# Patient Record
Sex: Male | Born: 1950
Health system: Southern US, Community
[De-identification: ages and names within clinical notes are randomized; demographics above are authoritative.]

## PROBLEM LIST (undated history)

## (undated) DIAGNOSIS — Z87442 Personal history of urinary calculi: Secondary | ICD-10-CM

## (undated) DIAGNOSIS — F329 Major depressive disorder, single episode, unspecified: Secondary | ICD-10-CM

## (undated) DIAGNOSIS — K219 Gastro-esophageal reflux disease without esophagitis: Secondary | ICD-10-CM

## (undated) DIAGNOSIS — J449 Chronic obstructive pulmonary disease, unspecified: Secondary | ICD-10-CM

## (undated) DIAGNOSIS — F419 Anxiety disorder, unspecified: Secondary | ICD-10-CM

## (undated) DIAGNOSIS — I10 Essential (primary) hypertension: Secondary | ICD-10-CM

## (undated) DIAGNOSIS — M1990 Unspecified osteoarthritis, unspecified site: Secondary | ICD-10-CM

## (undated) DIAGNOSIS — F32A Depression, unspecified: Secondary | ICD-10-CM

## (undated) DIAGNOSIS — R0600 Dyspnea, unspecified: Secondary | ICD-10-CM

## (undated) DIAGNOSIS — N200 Calculus of kidney: Secondary | ICD-10-CM

## (undated) DIAGNOSIS — M898X9 Other specified disorders of bone, unspecified site: Secondary | ICD-10-CM

## (undated) DIAGNOSIS — M4800 Spinal stenosis, site unspecified: Secondary | ICD-10-CM

## (undated) DIAGNOSIS — K5792 Diverticulitis of intestine, part unspecified, without perforation or abscess without bleeding: Secondary | ICD-10-CM

## (undated) DIAGNOSIS — I714 Abdominal aortic aneurysm, without rupture, unspecified: Secondary | ICD-10-CM

## (undated) HISTORY — PX: BACK SURGERY: SHX140

## (undated) HISTORY — PX: OTHER SURGICAL HISTORY: SHX169

## (undated) HISTORY — PX: CERVICAL SPINE SURGERY: SHX589

## (undated) HISTORY — PX: CYSTOSCOPY: SUR368

---

## 1999-06-16 ENCOUNTER — Inpatient Hospital Stay (HOSPITAL_COMMUNITY): Admission: EM | Admit: 1999-06-16 | Discharge: 1999-06-17 | Payer: Self-pay | Admitting: Cardiology

## 2000-10-05 ENCOUNTER — Emergency Department (HOSPITAL_COMMUNITY): Admission: EM | Admit: 2000-10-05 | Discharge: 2000-10-05 | Payer: Self-pay | Admitting: *Deleted

## 2003-04-29 ENCOUNTER — Ambulatory Visit (HOSPITAL_COMMUNITY): Admission: RE | Admit: 2003-04-29 | Discharge: 2003-04-29 | Payer: Self-pay | Admitting: Gastroenterology

## 2003-04-29 ENCOUNTER — Encounter (INDEPENDENT_AMBULATORY_CARE_PROVIDER_SITE_OTHER): Payer: Self-pay | Admitting: Specialist

## 2005-04-05 ENCOUNTER — Ambulatory Visit (HOSPITAL_COMMUNITY): Admission: RE | Admit: 2005-04-05 | Discharge: 2005-04-05 | Payer: Self-pay | Admitting: Internal Medicine

## 2006-05-17 ENCOUNTER — Ambulatory Visit (HOSPITAL_COMMUNITY): Admission: RE | Admit: 2006-05-17 | Discharge: 2006-05-17 | Payer: Self-pay | Admitting: Internal Medicine

## 2006-05-19 ENCOUNTER — Ambulatory Visit (HOSPITAL_COMMUNITY): Admission: RE | Admit: 2006-05-19 | Discharge: 2006-05-19 | Payer: Self-pay | Admitting: Urology

## 2006-05-22 ENCOUNTER — Ambulatory Visit (HOSPITAL_COMMUNITY): Admission: RE | Admit: 2006-05-22 | Discharge: 2006-05-22 | Payer: Self-pay | Admitting: Urology

## 2006-05-22 ENCOUNTER — Encounter (INDEPENDENT_AMBULATORY_CARE_PROVIDER_SITE_OTHER): Payer: Self-pay | Admitting: *Deleted

## 2006-08-08 ENCOUNTER — Ambulatory Visit (HOSPITAL_COMMUNITY): Admission: RE | Admit: 2006-08-08 | Discharge: 2006-08-08 | Payer: Self-pay | Admitting: Urology

## 2006-08-14 ENCOUNTER — Ambulatory Visit (HOSPITAL_COMMUNITY): Admission: RE | Admit: 2006-08-14 | Discharge: 2006-08-14 | Payer: Self-pay | Admitting: Urology

## 2008-08-26 ENCOUNTER — Ambulatory Visit (HOSPITAL_COMMUNITY): Admission: RE | Admit: 2008-08-26 | Discharge: 2008-08-26 | Payer: Self-pay | Admitting: Gastroenterology

## 2008-08-26 ENCOUNTER — Encounter (INDEPENDENT_AMBULATORY_CARE_PROVIDER_SITE_OTHER): Payer: Self-pay | Admitting: Gastroenterology

## 2009-05-08 ENCOUNTER — Ambulatory Visit (HOSPITAL_COMMUNITY): Admission: RE | Admit: 2009-05-08 | Discharge: 2009-05-08 | Payer: Self-pay | Admitting: Internal Medicine

## 2009-05-12 ENCOUNTER — Ambulatory Visit (HOSPITAL_COMMUNITY): Admission: RE | Admit: 2009-05-12 | Discharge: 2009-05-12 | Payer: Self-pay | Admitting: Internal Medicine

## 2009-09-22 ENCOUNTER — Ambulatory Visit (HOSPITAL_COMMUNITY): Admission: RE | Admit: 2009-09-22 | Discharge: 2009-09-23 | Payer: Self-pay | Admitting: Neurosurgery

## 2010-05-08 LAB — CBC
HCT: 46 % (ref 39.0–52.0)
Hemoglobin: 15.7 g/dL (ref 13.0–17.0)
Platelets: 233 10*3/uL (ref 150–400)
RBC: 4.74 MIL/uL (ref 4.22–5.81)
RDW: 14.5 % (ref 11.5–15.5)

## 2010-05-08 LAB — BASIC METABOLIC PANEL
BUN: 13 mg/dL (ref 6–23)
CO2: 31 mEq/L (ref 19–32)
Chloride: 106 mEq/L (ref 96–112)
GFR calc non Af Amer: >60 mL/min (ref >60)
Potassium: 4.6 mEq/L (ref 3.5–5.1)

## 2010-05-08 LAB — SURGICAL PCR SCREEN: MRSA, PCR: NEGATIVE

## 2010-05-08 LAB — URINALYSIS, ROUTINE W REFLEX MICROSCOPIC
Bilirubin Urine: NEGATIVE
Ketones, ur: NEGATIVE mg/dL
Leukocytes, UA: NEGATIVE
pH: 6 (ref 5.0–8.0)

## 2010-05-08 LAB — PROTIME-INR: Prothrombin Time: 11.9 seconds (ref 11.6–15.2)

## 2010-05-08 LAB — URINE MICROSCOPIC-ADD ON

## 2010-05-08 LAB — APTT: aPTT: 27 seconds (ref 24–37)

## 2010-06-08 ENCOUNTER — Other Ambulatory Visit (HOSPITAL_COMMUNITY): Payer: Self-pay | Admitting: Internal Medicine

## 2010-06-10 ENCOUNTER — Other Ambulatory Visit (HOSPITAL_COMMUNITY): Payer: Self-pay | Admitting: Internal Medicine

## 2010-06-10 ENCOUNTER — Ambulatory Visit (HOSPITAL_COMMUNITY)
Admission: RE | Admit: 2010-06-10 | Discharge: 2010-06-10 | Disposition: A | Discharge status: Home / Self Care | Payer: 59 | Source: Ambulatory Visit | Attending: Internal Medicine | Admitting: Internal Medicine

## 2010-06-10 DIAGNOSIS — N200 Calculus of kidney: Secondary | ICD-10-CM | POA: Insufficient documentation

## 2010-06-10 DIAGNOSIS — N400 Enlarged prostate without lower urinary tract symptoms: Secondary | ICD-10-CM | POA: Insufficient documentation

## 2010-06-10 DIAGNOSIS — R1032 Left lower quadrant pain: Secondary | ICD-10-CM | POA: Insufficient documentation

## 2010-06-10 DIAGNOSIS — R1031 Right lower quadrant pain: Secondary | ICD-10-CM | POA: Insufficient documentation

## 2010-06-10 DIAGNOSIS — Q619 Cystic kidney disease, unspecified: Secondary | ICD-10-CM | POA: Insufficient documentation

## 2010-06-10 DIAGNOSIS — K5730 Diverticulosis of large intestine without perforation or abscess without bleeding: Secondary | ICD-10-CM | POA: Insufficient documentation

## 2010-06-16 ENCOUNTER — Ambulatory Visit (INDEPENDENT_AMBULATORY_CARE_PROVIDER_SITE_OTHER): Payer: 59 | Admitting: Urology

## 2010-06-16 DIAGNOSIS — N200 Calculus of kidney: Secondary | ICD-10-CM

## 2010-06-16 DIAGNOSIS — R319 Hematuria, unspecified: Secondary | ICD-10-CM

## 2010-06-16 DIAGNOSIS — N529 Male erectile dysfunction, unspecified: Secondary | ICD-10-CM

## 2010-07-06 NOTE — Op Note (Signed)
NAME:  Jeremy Mclean, Jeremy Mclean                ACCOUNT NO.:  [id number]   MEDICAL RECORD NO.:  [id number]          PATIENT TYPE:  AMB   LOCATION:  ENDO                         FACILITY:  [facility]   PHYSICIAN:  John C. Madilyn Fireman, M.D.    DATE OF BIRTH:  [birth year]   DATE OF PROCEDURE:  DATE OF DISCHARGE:                               OPERATIVE REPORT   TITLE OF PROCEDURE:  Colonoscopy with polypectomy.   INDICATIONS FOR PROCEDURE:  Family history of colon polyps or colon  cancer in a first-degree relative.   PROCEDURE:  The patient was placed in the left lateral decubitus  position and placed on the pulse monitor with continuous low-flow oxygen  delivered by nasal cannula.  He  was sedated with 100 mcg of IV fentanyl  and 10 mg of IV Versed.  The Olympus video colonoscope was inserted into  the rectum and advanced to the cecum, confirmed by transillumination of  McBurney's point and visualization of ileocecal valve and appendiceal  orifice.  The prep was good.  The cecum, ascending and transverse colon  all appeared normal with no masses, polyps, diverticula or other mucosal  abnormalities.  Within the descending and sigmoid colon there was seen  multiple diverticula.  At the rectosigmoid junction there was a small  sessile polyp that was fulgurated by hot biopsy.  The remainder of the  rectosigmoid and rectum appeared normal.  The scope was then withdrawn  and the patient returned to the recovery room in stable condition.   He tolerated the procedure well.  There were no immediate complications.   IMPRESSION:  1. Rectosigmoid polyp.  2. Left-sided diverticulosis.   PLAN:  Await histology and will probably repeat colonoscopy in 5 years.           ______________________________  Everardo All Madilyn Fireman, M.D.     JCH/MEDQ  D:  08/26/2008  T:  08/26/2008  Job:  [id number]   cc:   Kingsley Callander. Ouida Sills, MD  Fax: [phone]

## 2010-07-09 NOTE — Op Note (Signed)
NAME:  Jeremy Mclean, Jeremy Mclean                          ACCOUNT NO.:  [id number]   MEDICAL RECORD NO.:  [id number]                   PATIENT TYPE:  AMB   LOCATION:  ENDO                                 FACILITY:  [facility]   PHYSICIAN:  John C. Madilyn Fireman, M.D.                 DATE OF BIRTH:  [birth year]   DATE OF PROCEDURE:  04/29/2003  DATE OF DISCHARGE:                                 OPERATIVE REPORT   PROCEDURE:  Colonoscopy.   INDICATIONS FOR PROCEDURE:  Family history of colon cancer in a first degree  relative.   DESCRIPTION OF PROCEDURE:  The patient was placed in the left lateral  decubitus position then placed on the pulse monitor with continuous low flow  oxygen delivered by nasal cannula. He was sedated with 75 mcg IV fentanyl  and 8 mg IV Versed. The Olympus video colonoscope was inserted into the  rectum and advanced to the cecum, confirmed by transillumination at  McBurney's point and visualization of the ileocecal valve and appendiceal  orifice. The prep was excellent. The cecum, ascending, transverse and  descending colon all appeared normal with no masses, polyps, diverticula or  other mucosal abnormalities.  In the sigmoid colon, there were seen a few  scattered diverticula and no other abnormalities.  Also there was a 7 mm  polyp in the sigmoid that was fulgurated by hot biopsy.  The rectum appeared  normal and retroflexed view of the anus revealed only small internal  hemorrhoids. The scope was then withdrawn and the patient returned to the  recovery room in stable condition. The patient tolerated the procedure well  and there were no immediate complications.   IMPRESSION:  1. Diverticulosis.  2. Small sigmoid colon polyp.   PLAN:  Await histology to determine interval for next colonoscopy.                                               John C. Madilyn Fireman, M.D.    JCH/MEDQ  D:  04/29/2003  T:  04/29/2003  Job:  [id number]   cc:   Kingsley Callander. Ouida Sills, M.D.  [address]  [place]  [place] [address]  Fax: [phone]

## 2010-07-09 NOTE — H&P (Signed)
Jeremy, Mclean                ACCOUNT NO.:  [id number]   MEDICAL RECORD NO.:  [id number]          PATIENT TYPE:  AMB   LOCATION:  DAY                           FACILITY:  [facility]   PHYSICIAN:  Dennie Maizes, M.D.   DATE OF BIRTH:  November 25, 1950   DATE OF ADMISSION:  05/22/2006  DATE OF DISCHARGE:  LH                              HISTORY & PHYSICAL   CHIEF COMPLAINT:  Left flank pain radiating to the front, left ureteral  calculus.   HISTORY OF PRESENT ILLNESS:  This 60 year old male has history of  recurrent urolithiasis.  He was referred to me by Dr. Ouida Sills.  He had  been having left flank pain and left  abdominal pain of mild intensity  for the past several months.  The pain became serious about a week ago.  He has severe left flank pain radiating to the front.  He was seen by  Dr. Ouida Sills in his office and sent for CT scan of the abdomen and pelvis  without contrast.  This has revealed small bilateral renal calculi  without obstruction.  There is a 4 mm size left distal ureteral calculus  with minimal hydronephrosis and hydroureter.  The patient is unable to  pass the stone.   He complains of urinary frequency and nocturia x0.  He has no history of  fever or chills, voiding difficulty, worsening hematuria, or nausea or  vomiting.  The patient last passed a stone about a year ago.   PAST MEDICAL HISTORY:  No medical illnesses.  History of recurrent  urolithiasis.   MEDICATIONS:  Valium 5 mg p.o. 3 times a day p.r.n.   ALLERGIES:  None.   OPERATIONS:  None.   FAMILY HISTORY:  Positive for carcinoma of the spine.   PHYSICAL EXAMINATION:  VITAL SIGNS:  Height 5 feet 10 inches, weight 212  pounds.  HEAD, EYES, EARS, NOSE, AND THROAT:  Normal.  NECK:  No masses.  LUNGS:  Clear to auscultation.  HEART:  Regular rate and rhythm with no murmurs.  ABDOMEN:  Soft.  No palpable flank mass or CVA tenderness.  Bladder not  palpable.  GU:  Penis and testes are normal.  RECTAL:  Examination not done.   X-ray of KUB area revealed a 5 mm size left distal ureteral calculus at  the ureterosacral junction area.   IMPRESSION:  1. Left distal ureter calculus with obstruction.  2. Left renal colic.  3. Left hydronephrosis.   PLAN:  The patient is unable to pass the stone, and he has intermittent  severe pain.  He is cleared to undergo cystoscopy, left retrograde  pyelogram, left uroscopic stone extraction, and left ureteral stent  placement in short-stay center on May 22, 2006.  I have explained to  the patient regarding the diagnosis, operative details, alternative  treatments, outcome, possible risks and complications, and he has agreed  for the procedure to be done.      Dennie Maizes, M.D.  Electronically Signed     SK/MEDQ  D:  05/21/2006  T:  05/21/2006  Job:  [id number]   cc:  Kingsley Callander. Ouida Sills, MD  Fax: [phone]   [facility]  Fax: [phone]

## 2010-07-09 NOTE — Op Note (Signed)
NAMESIGURD, PUGH                ACCOUNT NO.:  [id number]   MEDICAL RECORD NO.:  [id number]          PATIENT TYPE:  AMB   LOCATION:  DAY                           FACILITY:  [facility]   PHYSICIAN:  Dennie Maizes, M.D.   DATE OF BIRTH:  [birth year]   DATE OF PROCEDURE:  05/22/2006  DATE OF DISCHARGE:                               OPERATIVE REPORT   PREOPERATIVE DIAGNOSIS:  Left distal ureteral calculus with obstruction,  left hydronephrosis, left renal colic.   POSTOPERATIVE DIAGNOSIS:  Left distal ureteral calculus with  obstruction, left hydronephrosis, left renal colic.   OPERATIVE PROCEDURE:  Cystoscopy, left retrograde pyelogram, left  ureteroscopy, stone extraction and left ureteral stent placement.   ANESTHESIA:  General.   SURGEON:  Dennie Maizes, M.D.   COMPLICATIONS:  None.   ESTIMATED BLOOD LOSS:  Minimal.   SPECIMEN:  Left ureteral calculus which was sent for chemical analysis.   DRAINS:  6 French 26 cm size left ureteral stent with a string.   COMPLICATIONS:  None.   INDICATIONS FOR PROCEDURE:  This 60 year old male had severe left flank  pain.  Evaluation revealed a 4 mm size left distal ureteral calculus  with obstruction and hydronephrosis.  The patient was unable to pass the  stone.  He was taken to the operating room today for cystoscopy, left  retrograde pyelogram, left ureteroscopy, stone extraction and left  ureteral stent placement.   DESCRIPTION OF PROCEDURE:  General anesthesia was induced and the  patient was placed on the OR table in the dorsal lithotomy position.  The lower abdomen and genitalia were prepped and draped in a sterile  fashion.  Cystoscopy was done with a 25-French scope.  Urethra and  prostate were normal.  The appearance of bladder was unremarkable.  A 5-  French wedge catheter was then placed in the left ureteral orifice.  About 7 mL of Omnipaque was injected in the collecting system and  retrograde pyelogram was done using  C-arm fluoroscopy.  There is a small  filling defect in the distal ureter about 1 cm above ureteral orifice.   A 5-French open-ended catheter was then placed in the left ureteral  orifice.  A 0.138 the Bentson guide wire with a flexible tip was then  advanced in the left renal pelvis.  Distal ureter was dilated using a 18-  French 6 cm size balloon dilating catheter.  The balloon dilating  catheter was then removed leaving the guidewire in place.   Ureteroscopy was done with using a 8.5 French rigid ureteroscope.  The  stone was seen at about 2 cm above ureteral orifice.  The stone was  retrieved with a zero tip nitinol wire basket without any difficulty.  Examination of ureter was done up to the pelvic brim and no other  abnormality was noted.  A 6 French 26 cm size stent with a string was  then inserted to the left collecting system and taped to the penis.  The  cystoscope was removed.  The patient was transferred to the PACU in a  satisfactory condition.  Dennie Maizes, M.D.  Electronically Signed     SK/MEDQ  D:  05/22/2006  T:  05/22/2006  Job:  [id number]   cc:   Kingsley Callander. Ouida Sills, MD  Fax: [phone]

## 2010-07-09 NOTE — H&P (Signed)
Jeremy Mclean. [facility]  Patient:    Jeremy Mclean, Jeremy Mclean                       MRN: [id number] Adm. Date:  [id number] Disc. Date: [id number] Attending:  Lyn Records. Iii                         History and Physical  REASON FOR ADMISSION:  Ventricular bigeminy.  SUBJECTIVE:  The patients story is somewhat confusing.  He was well until yesterday, when he had a tooth extracted. During the night, he took pain medications and began to feel sick. There was some nausea.  When he continued to feel poorly this morning, his daughter measured his blood pressure and it was mildly elevated.  This in conjunction with not feeling well, and noticing some irregularity in his pulse, they brought him to the emergency room.  In the emergency room, he was evaluated by the physician at [facility] ER and found to be in ventricular bigeminy, but with no cardiopulmonary symptoms. Spoke with Dr. Andee Lineman, of the [facility], and arrangements were made to have the patient admitted to [facility] for possible unstable angina, although everyone felt that his symptomatology was very atypical. The patients sister ask that I manage the patient, and I have assumed his care.  The thing with the patient is that he feels that his sickness early this morning was related to Vicodin that he took for pain.  When he was told that his heart rhythm was irregular, he had no sensation of palpitations, or felt any particular problems.  There is no history of heart disease.  He specifically denies tachycardia, palpitations, syncope, dyspnea, coronary disease, exertional angina.  SOCIAL HISTORY:  He is a Visual merchandiser.  He is married.  HABITS:  He smokes cigarettes, two packs per day.  He does not drink alcohol.  PAST MEDICAL HISTORY:  Negative for significant problems.  FAMILY HISTORY:  Mother had a heart attack at age 83.  She also has had CVAs and has dementia.  ALLERGIES:  ______________ contrast  agents.  MEDICATIONS:  Veetids 500 mg q.6h. and Vicodin were given yesterday for pain.  REVIEW OF SYSTEMS:  No limitations on physical activity.  Sinus trouble particular this time of year.  No history of heart disease, lung disease, kidney disease, liver disease, skin disease.  PHYSICAL EXAMINATION:  VITAL SIGNS:  Blood pressure 140/70, heart rate 68, respirations 16 and nonlabored.  HEENT:  Grossly unremarkable.  NECK:  Exam reveals no JVD, carotid bruits, or thyromegaly.  CHEST:  Clear to auscultation and percussion.  CARDIAC:  Normal.  No murmurs, rubs, clicks, or gallops are heard.  ABDOMEN:  Soft.  Liver and spleen not palpable.  Bowel sounds normal.  EXTREMITIES:  Reveal no edema.  Pulses are 2+ and symmetric.  NEUROLOGIC:  Normal.  LABORATORY DATA:  EKG does not reveal any acute changes.  No evidence of prior infarction, PVCs and a bigeminal pattern.  Chest x-ray, portable, done at [facility], reveals no acute process.  Blood work done at [facility] revealed a hemoglobin of 16.4, WBC of 10.4 thousand, platelet count 234.  Potassium 3.8.  CK-MB and troponin I were normal.  ASSESSMENT: 1. Ventricular bigeminy, probably related to catecholamine effect from sinus    medication and stress related to the pain from tooth extraction.  We need    to rule out left ventricular  dysfunction.  Perhaps, the patient should    eventually have a stress test performed to rule out occult coronary    disease. 2. Recent tooth abstraction. 3. Borderline blood pressure.  PLAN: 1. Admit to monitor. 2. Serial enzymes to rule out infarction. 3. A 2-D echocardiogram to access the heart for structural integrity. 4. May eventually require excess treadmill test as a baseline for    future reference, given risk factors. DD:  06/16/99 TD:  06/16/99 Job: [id number]

## 2010-07-20 ENCOUNTER — Ambulatory Visit (INDEPENDENT_AMBULATORY_CARE_PROVIDER_SITE_OTHER): Payer: 59 | Admitting: Urology

## 2010-07-20 DIAGNOSIS — N200 Calculus of kidney: Secondary | ICD-10-CM

## 2010-07-20 DIAGNOSIS — R319 Hematuria, unspecified: Secondary | ICD-10-CM

## 2010-07-20 DIAGNOSIS — N529 Male erectile dysfunction, unspecified: Secondary | ICD-10-CM

## 2011-11-24 ENCOUNTER — Ambulatory Visit (INDEPENDENT_AMBULATORY_CARE_PROVIDER_SITE_OTHER): Payer: 59 | Admitting: Otolaryngology

## 2011-11-24 DIAGNOSIS — H903 Sensorineural hearing loss, bilateral: Secondary | ICD-10-CM

## 2011-11-24 DIAGNOSIS — H6990 Unspecified Eustachian tube disorder, unspecified ear: Secondary | ICD-10-CM

## 2011-11-24 DIAGNOSIS — H9319 Tinnitus, unspecified ear: Secondary | ICD-10-CM

## 2011-11-24 DIAGNOSIS — H902 Conductive hearing loss, unspecified: Secondary | ICD-10-CM

## 2011-11-24 DIAGNOSIS — H6980 Other specified disorders of Eustachian tube, unspecified ear: Secondary | ICD-10-CM

## 2011-12-18 ENCOUNTER — Encounter (HOSPITAL_COMMUNITY): Payer: Self-pay

## 2011-12-18 ENCOUNTER — Emergency Department (HOSPITAL_COMMUNITY)
Admission: EM | Admit: 2011-12-18 | Discharge: 2011-12-18 | Disposition: A | Discharge status: Home / Self Care | Payer: 59 | Attending: Emergency Medicine | Admitting: Emergency Medicine

## 2011-12-18 DIAGNOSIS — Y9289 Other specified places as the place of occurrence of the external cause: Secondary | ICD-10-CM | POA: Insufficient documentation

## 2011-12-18 DIAGNOSIS — I10 Essential (primary) hypertension: Secondary | ICD-10-CM | POA: Insufficient documentation

## 2011-12-18 DIAGNOSIS — S61209A Unspecified open wound of unspecified finger without damage to nail, initial encounter: Secondary | ICD-10-CM | POA: Insufficient documentation

## 2011-12-18 DIAGNOSIS — S61259A Open bite of unspecified finger without damage to nail, initial encounter: Secondary | ICD-10-CM

## 2011-12-18 DIAGNOSIS — IMO0001 Reserved for inherently not codable concepts without codable children: Secondary | ICD-10-CM | POA: Insufficient documentation

## 2011-12-18 DIAGNOSIS — F17200 Nicotine dependence, unspecified, uncomplicated: Secondary | ICD-10-CM | POA: Insufficient documentation

## 2011-12-18 DIAGNOSIS — Y9389 Activity, other specified: Secondary | ICD-10-CM | POA: Insufficient documentation

## 2011-12-18 HISTORY — DX: Essential (primary) hypertension: I10

## 2011-12-18 MED ORDER — AMOXICILLIN-POT CLAVULANATE 875-125 MG PO TABS: 1.0000 | ORAL_TABLET | Freq: Two times a day (BID) | ORAL | Status: DC

## 2011-12-18 MED ORDER — AMOXICILLIN-POT CLAVULANATE 875-125 MG PO TABS
1.0000 | ORAL_TABLET | Freq: Once | ORAL | Status: AC
  Administered 2011-12-18: 1 via ORAL
  Filled 2011-12-18: qty 1

## 2011-12-18 NOTE — ED Provider Notes (Addendum)
History     CSN: [id number]  Arrival date & time 12/18/11  0865   First MD Initiated Contact with Patient 12/18/11 [phone]      Chief Complaint  Patient presents with  . Animal Bite    (Consider location/radiation/quality/duration/timing/severity/associated sxs/prior treatment) HPI Comments: Pt caught a fox in a trap set for coyotes.  "i was going to shoot him but i had an empty chamber in my gun.  It jumped at me and nipped my finger".  The animal was killed and turned over to animal control and is to be sent to [place] for rabies testing.  DT utd.  No other complaints.  Patient is a 61 y.o. male presenting with animal bite. The history is provided by the patient. No language interpreter was used.  Animal Bite  The incident occurred yesterday. He came to the ER via personal transport. There is an injury to the right long finger. The patient is experiencing no pain. It is unlikely that a foreign body is present. Pertinent negatives include no numbness and no weakness. There have been no prior injuries to these areas. He is right-handed. His tetanus status is UTD. There were no sick contacts. He has received no recent medical care.    Past Medical History  Diagnosis Date  . Hypertension     History reviewed. No pertinent past surgical history.  No family history on file.  History  Substance Use Topics  . Smoking status: Current Every Day Smoker  . Smokeless tobacco: Not on file  . Alcohol Use: No      Review of Systems  Constitutional: Negative for fever and chills.  Skin: Positive for wound.  Neurological: Negative for weakness and numbness.  All other systems reviewed and are negative.    Allergies  Iohexol  Home Medications   Current Outpatient Rx  Name Route Sig Dispense Refill  . AMOXICILLIN-POT CLAVULANATE 875-125 MG PO TABS Oral Take 1 tablet by mouth every 12 (twelve) hours. 14 tablet 0    BP 143/111  Pulse 77  Temp 98 F (36.7 C)  Resp 16  Ht 5\' 8"   (1.727 m)  Wt 190 lb (86.183 kg)  BMI 28.89 kg/m2  SpO2 97%  Physical Exam  Nursing note and vitals reviewed. Constitutional: He is oriented to person, place, and time. He appears well-developed and well-nourished.  HENT:  Head: Normocephalic and atraumatic.  Right Ear: External ear normal.  Left Ear: External ear normal.  Eyes: EOM are normal.  Neck: Normal range of motion.  Cardiovascular: Normal rate, regular rhythm and intact distal pulses.   Pulmonary/Chest: Effort normal. No respiratory distress.  Abdominal: Soft. He exhibits no distension. There is no tenderness.  Musculoskeletal: Normal range of motion. He exhibits no tenderness.       Right hand: He exhibits laceration. He exhibits normal range of motion, no tenderness, no bony tenderness, normal two-point discrimination, normal capillary refill, no deformity and no swelling. normal sensation noted. Normal strength noted.       Hands: Neurological: He is alert and oriented to person, place, and time.  Skin: Skin is warm and dry.  Psychiatric: He has a normal mood and affect. Judgment normal.    ED Course  Procedures (including critical care time)  Labs Reviewed - No data to display No results found.   1. Animal bite of finger       MDM  Waiting on rabies testing on fox in [place] rx-augmentin 875 BID, 14 DT UTD  Addendum on 12-19-11.  Pt returned after being called by animal control and told that the animal is being sent to [place] today and that they are concerned that the rabies test results may not be back in time window to allow the series to be administered.  He will be given the RIF and initial vaccine.  The remaining shots in the specialty clinic.    Evalina Field, PA 12/18/11 0840  Evalina Field, PA 12/19/11 1050

## 2011-12-18 NOTE — ED Notes (Signed)
C/o fox bite to rt middle finger that occurred yesterday. Fox sent off to be tested for rabies.

## 2011-12-18 NOTE — ED Provider Notes (Signed)
Medical screening examination/treatment/procedure(s) were performed by non-physician practitioner and as supervising physician I was immediately available for consultation/collaboration.  John-Adam Kasra Melvin, M.D.     John-Adam Gregg Holster, MD 12/18/11 1243 

## 2011-12-19 ENCOUNTER — Emergency Department (HOSPITAL_COMMUNITY)
Admission: EM | Admit: 2011-12-19 | Discharge: 2011-12-19 | Disposition: A | Discharge status: Home / Self Care | Payer: 59 | Source: Home / Self Care | Attending: Emergency Medicine | Admitting: Emergency Medicine

## 2011-12-19 DIAGNOSIS — T148XXA Other injury of unspecified body region, initial encounter: Secondary | ICD-10-CM

## 2011-12-19 DIAGNOSIS — Z792 Long term (current) use of antibiotics: Secondary | ICD-10-CM | POA: Insufficient documentation

## 2011-12-19 DIAGNOSIS — T148 Other injury of unspecified body region: Secondary | ICD-10-CM | POA: Insufficient documentation

## 2011-12-19 DIAGNOSIS — I10 Essential (primary) hypertension: Secondary | ICD-10-CM | POA: Insufficient documentation

## 2011-12-19 DIAGNOSIS — W5581XA Bitten by other mammals, initial encounter: Secondary | ICD-10-CM | POA: Insufficient documentation

## 2011-12-19 DIAGNOSIS — Y939 Activity, unspecified: Secondary | ICD-10-CM | POA: Insufficient documentation

## 2011-12-19 DIAGNOSIS — Y929 Unspecified place or not applicable: Secondary | ICD-10-CM | POA: Insufficient documentation

## 2011-12-19 DIAGNOSIS — Z79899 Other long term (current) drug therapy: Secondary | ICD-10-CM | POA: Insufficient documentation

## 2011-12-19 DIAGNOSIS — F17200 Nicotine dependence, unspecified, uncomplicated: Secondary | ICD-10-CM | POA: Insufficient documentation

## 2011-12-19 MED ORDER — RABIES VACCINE, PCEC IM SUSR
1.0000 mL | Freq: Once | INTRAMUSCULAR | Status: AC
  Filled 2011-12-19: qty 1

## 2011-12-19 MED ORDER — RABIES IMMUNE GLOBULIN 150 UNIT/ML IM INJ: 1720.0000 [IU] | INJECTION | Freq: Once | INTRAMUSCULAR | Status: AC

## 2011-12-19 NOTE — ED Provider Notes (Signed)
Medical screening examination/treatment/procedure(s) were conducted as a shared visit with non-physician practitioner(s) and myself.  I personally evaluated the patient during the encounter   Rabies vaccination now required because animal cannot be studied for possible rabies for several days. Patient to receive rabies protocol (vaccine and RIG) until animal confirmed.   Shelda Jakes, MD 12/19/11 2017

## 2011-12-19 NOTE — ED Notes (Signed)
Pt arrived to ed today, was called by health department and told needed to get rabies vaccinations. Pt returned to er for treatments. Pa miller notified of pt's return.

## 2011-12-19 NOTE — ED Notes (Signed)
1720 Units Hyperab given. Lot number [id number], exp 02 Jun 2013. I inj R deltoid, 2 in bilat lat thighs. 1 ml injection of Rabavert/Imovax given in L deltoid. Lot [id number], exp date 10 Sep 14. Pt tolerated well. No adverse side effects noted.

## 2011-12-22 ENCOUNTER — Ambulatory Visit (HOSPITAL_COMMUNITY): Payer: 59

## 2011-12-26 ENCOUNTER — Encounter (HOSPITAL_COMMUNITY): Payer: 59

## 2012-01-02 ENCOUNTER — Ambulatory Visit (HOSPITAL_COMMUNITY): Payer: 59

## 2012-08-06 ENCOUNTER — Other Ambulatory Visit (HOSPITAL_COMMUNITY): Payer: Self-pay | Admitting: Internal Medicine

## 2012-08-06 DIAGNOSIS — R1032 Left lower quadrant pain: Secondary | ICD-10-CM

## 2012-08-08 ENCOUNTER — Ambulatory Visit (HOSPITAL_COMMUNITY)
Admission: RE | Admit: 2012-08-08 | Discharge: 2012-08-08 | Disposition: A | Discharge status: Home / Self Care | Payer: 59 | Source: Ambulatory Visit | Attending: Internal Medicine | Admitting: Internal Medicine

## 2012-08-08 ENCOUNTER — Encounter (HOSPITAL_COMMUNITY): Payer: Self-pay

## 2012-08-08 DIAGNOSIS — R1032 Left lower quadrant pain: Secondary | ICD-10-CM | POA: Insufficient documentation

## 2012-08-08 DIAGNOSIS — K5730 Diverticulosis of large intestine without perforation or abscess without bleeding: Secondary | ICD-10-CM | POA: Insufficient documentation

## 2013-03-27 ENCOUNTER — Other Ambulatory Visit (HOSPITAL_COMMUNITY): Payer: Self-pay | Admitting: Internal Medicine

## 2013-03-27 ENCOUNTER — Ambulatory Visit (HOSPITAL_COMMUNITY)
Admission: RE | Admit: 2013-03-27 | Discharge: 2013-03-27 | Disposition: A | Discharge status: Home / Self Care | Payer: 59 | Source: Ambulatory Visit | Attending: Internal Medicine | Admitting: Internal Medicine

## 2013-03-27 DIAGNOSIS — R509 Fever, unspecified: Secondary | ICD-10-CM | POA: Insufficient documentation

## 2013-03-27 DIAGNOSIS — R05 Cough: Secondary | ICD-10-CM

## 2013-03-27 DIAGNOSIS — R059 Cough, unspecified: Secondary | ICD-10-CM | POA: Insufficient documentation

## 2013-04-09 ENCOUNTER — Encounter (HOSPITAL_COMMUNITY): Payer: Self-pay | Admitting: Emergency Medicine

## 2013-04-09 ENCOUNTER — Emergency Department (HOSPITAL_COMMUNITY)
Admission: EM | Admit: 2013-04-09 | Discharge: 2013-04-09 | Disposition: A | Discharge status: Home / Self Care | Payer: 59 | Attending: Emergency Medicine | Admitting: Emergency Medicine

## 2013-04-09 DIAGNOSIS — R109 Unspecified abdominal pain: Secondary | ICD-10-CM

## 2013-04-09 DIAGNOSIS — K5792 Diverticulitis of intestine, part unspecified, without perforation or abscess without bleeding: Secondary | ICD-10-CM

## 2013-04-09 DIAGNOSIS — I10 Essential (primary) hypertension: Secondary | ICD-10-CM | POA: Insufficient documentation

## 2013-04-09 DIAGNOSIS — Z79899 Other long term (current) drug therapy: Secondary | ICD-10-CM | POA: Insufficient documentation

## 2013-04-09 DIAGNOSIS — Z87442 Personal history of urinary calculi: Secondary | ICD-10-CM | POA: Insufficient documentation

## 2013-04-09 DIAGNOSIS — F17200 Nicotine dependence, unspecified, uncomplicated: Secondary | ICD-10-CM | POA: Insufficient documentation

## 2013-04-09 DIAGNOSIS — Z8659 Personal history of other mental and behavioral disorders: Secondary | ICD-10-CM | POA: Insufficient documentation

## 2013-04-09 DIAGNOSIS — K5732 Diverticulitis of large intestine without perforation or abscess without bleeding: Secondary | ICD-10-CM | POA: Insufficient documentation

## 2013-04-09 HISTORY — DX: Calculus of kidney: N20.0

## 2013-04-09 HISTORY — DX: Depression, unspecified: F32.A

## 2013-04-09 HISTORY — DX: Major depressive disorder, single episode, unspecified: F32.9

## 2013-04-09 HISTORY — DX: Diverticulitis of intestine, part unspecified, without perforation or abscess without bleeding: K57.92

## 2013-04-09 LAB — CBC WITH DIFFERENTIAL/PLATELET
BASOS ABS: 0 10*3/uL (ref 0.0–0.1)
Basophils Relative: 0 % (ref 0–1)
EOS ABS: 0.3 10*3/uL (ref 0.0–0.7)
EOS PCT: 4 % (ref 0–5)
HEMATOCRIT: 44.9 % (ref 39.0–52.0)
Hemoglobin: 15.7 g/dL (ref 13.0–17.0)
LYMPHS PCT: 33 % (ref 12–46)
Lymphs Abs: 2.5 10*3/uL (ref 0.7–4.0)
MCH: 32.5 pg (ref 26.0–34.0)
MCHC: 35 g/dL (ref 30.0–36.0)
MCV: 93 fL (ref 78.0–100.0)
MONO ABS: 1.1 10*3/uL — AB (ref 0.1–1.0)
Monocytes Relative: 14 % — ABNORMAL HIGH (ref 3–12)
Neutro Abs: 3.6 10*3/uL (ref 1.7–7.7)
Neutrophils Relative %: 48 % (ref 43–77)
PLATELETS: 273 10*3/uL (ref 150–400)
RBC: 4.83 MIL/uL (ref 4.22–5.81)
RDW: 14.1 % (ref 11.5–15.5)
WBC: 7.6 10*3/uL (ref 4.0–10.5)

## 2013-04-09 LAB — URINALYSIS, ROUTINE W REFLEX MICROSCOPIC
BILIRUBIN URINE: NEGATIVE
Glucose, UA: NEGATIVE mg/dL
HGB URINE DIPSTICK: NEGATIVE
KETONES UR: NEGATIVE mg/dL
Leukocytes, UA: NEGATIVE
Nitrite: NEGATIVE
Protein, ur: NEGATIVE mg/dL
SPECIFIC GRAVITY, URINE: 1.015 (ref 1.005–1.030)
UROBILINOGEN UA: 0.2 mg/dL (ref 0.0–1.0)
pH: 6 (ref 5.0–8.0)

## 2013-04-09 LAB — COMPREHENSIVE METABOLIC PANEL
ALK PHOS: 60 U/L (ref 39–117)
ALT: 17 U/L (ref 0–53)
AST: 18 U/L (ref 0–37)
Albumin: 3.5 g/dL (ref 3.5–5.2)
BILIRUBIN TOTAL: 0.2 mg/dL — AB (ref 0.3–1.2)
BUN: 10 mg/dL (ref 6–23)
CHLORIDE: 104 meq/L (ref 96–112)
CO2: 26 mEq/L (ref 19–32)
CREATININE: 0.84 mg/dL (ref 0.50–1.35)
Calcium: 9 mg/dL (ref 8.4–10.5)
GFR calc non Af Amer: >90 mL/min (ref >90)
GLUCOSE: 91 mg/dL (ref 70–99)
POTASSIUM: 4 meq/L (ref 3.7–5.3)
Sodium: 141 mEq/L (ref 137–147)
TOTAL PROTEIN: 6.9 g/dL (ref 6.0–8.3)

## 2013-04-09 LAB — LIPASE, BLOOD: LIPASE: 24 U/L (ref 11–59)

## 2013-04-09 MED ORDER — SODIUM CHLORIDE 0.9 % IV BOLUS (SEPSIS)
1000.0000 mL | Freq: Once | INTRAVENOUS | Status: AC
  Administered 2013-04-09: 1000 mL via INTRAVENOUS

## 2013-04-09 MED ORDER — HYDROCODONE-ACETAMINOPHEN 5-325 MG PO TABS: 2.0000 | ORAL_TABLET | ORAL | Status: DC | PRN

## 2013-04-09 MED ORDER — METRONIDAZOLE 500 MG PO TABS
500.0000 mg | ORAL_TABLET | Freq: Three times a day (TID) | ORAL | Status: DC
Start: 2013-04-09 — End: 2013-04-09

## 2013-04-09 MED ORDER — CIPROFLOXACIN HCL 500 MG PO TABS: 500.0000 mg | ORAL_TABLET | Freq: Two times a day (BID) | ORAL | Status: DC

## 2013-04-09 MED ORDER — METRONIDAZOLE 500 MG PO TABS: 500.0000 mg | ORAL_TABLET | Freq: Three times a day (TID) | ORAL | Status: DC

## 2013-04-09 NOTE — Discharge Instructions (Signed)
Cipro and Flagyl as prescribed.  Hydrocodone as needed for pain.  Return to the emergency department if you develop worsening pain, high fever, bloody stool, or if your symptoms are not improving in the next 24-48 hours.   Abdominal Pain, Adult Many things can cause abdominal pain. Usually, abdominal pain is not caused by a disease and will improve without treatment. It can often be observed and treated at home. Your health care provider will do a physical exam and possibly order blood tests and X-rays to help determine the seriousness of your pain. However, in many cases, more time must pass before a clear cause of the pain can be found. Before that point, your health care provider may not know if you need more testing or further treatment. HOME CARE INSTRUCTIONS  Monitor your abdominal pain for any changes. The following actions may help to alleviate any discomfort you are experiencing:  Only take over-the-counter or prescription medicines as directed by your health care provider.  Do not take laxatives unless directed to do so by your health care provider.  Try a clear liquid diet (broth, tea, or water) as directed by your health care provider. Slowly move to a bland diet as tolerated. SEEK MEDICAL CARE IF:  You have unexplained abdominal pain.  You have abdominal pain associated with nausea or diarrhea.  You have pain when you urinate or have a bowel movement.  You experience abdominal pain that wakes you in the night.  You have abdominal pain that is worsened or improved by eating food.  You have abdominal pain that is worsened with eating fatty foods. SEEK IMMEDIATE MEDICAL CARE IF:   Your pain does not go away within 2 hours.  You have a fever.  You keep throwing up (vomiting).  Your pain is felt only in portions of the abdomen, such as the right side or the left lower portion of the abdomen.  You pass bloody or black tarry stools. MAKE SURE YOU:  Understand these  instructions.   Will watch your condition.   Will get help right away if you are not doing well or get worse.  Document Released: 11/17/2004 Document Revised: 11/28/2012 Document Reviewed: 10/17/2012 [facility] Patient Information 2014 [place].  Diverticulitis A diverticulum is a small pouch or sac on the colon. Diverticulosis is the presence of these diverticula on the colon. Diverticulitis is the irritation (inflammation) or infection of diverticula. CAUSES  The colon and its diverticula contain bacteria. If food particles block the tiny opening to a diverticulum, the bacteria inside can grow and cause an increase in pressure. This leads to infection and inflammation and is called diverticulitis. SYMPTOMS   Abdominal pain and tenderness. Usually, the pain is located on the left side of your abdomen. However, it could be located elsewhere.  Fever.  Bloating.  Feeling sick to your stomach (nausea).  Throwing up (vomiting).  Abnormal stools. DIAGNOSIS  Your caregiver will take a history and perform a physical exam. Since many things can cause abdominal pain, other tests may be necessary. Tests may include:  Blood tests.  Urine tests.  X-ray of the abdomen.  CT scan of the abdomen. Sometimes, surgery is needed to determine if diverticulitis or other conditions are causing your symptoms. TREATMENT  Most of the time, you can be treated without surgery. Treatment includes:  Resting the bowels by only having liquids for a few days. As you improve, you will need to eat a low-fiber diet.  Intravenous (IV) fluids if you  are losing body fluids (dehydrated).  Antibiotic medicines that treat infections may be given.  Pain and nausea medicine, if needed.  Surgery if the inflamed diverticulum has burst. HOME CARE INSTRUCTIONS   Try a clear liquid diet (broth, tea, or water for as long as directed by your caregiver). You may then gradually begin a low-fiber diet as  tolerated.  A low-fiber diet is a diet with less than 10 grams of fiber. Choose the foods below to reduce fiber in the diet:  White breads, cereals, rice, and pasta.  Cooked fruits and vegetables or soft fresh fruits and vegetables without the skin.  Ground or well-cooked tender beef, ham, veal, lamb, pork, or poultry.  Eggs and seafood.  After your diverticulitis symptoms have improved, your caregiver may put you on a high-fiber diet. A high-fiber diet includes 14 grams of fiber for every 1000 calories consumed. For a standard 2000 calorie diet, you would need 28 grams of fiber. Follow these diet guidelines to help you increase the fiber in your diet. It is important to slowly increase the amount fiber in your diet to avoid gas, constipation, and bloating.  Choose whole-grain breads, cereals, pasta, and brown rice.  Choose fresh fruits and vegetables with the skin on. Do not overcook vegetables because the more vegetables are cooked, the more fiber is lost.  Choose more nuts, seeds, legumes, dried peas, beans, and lentils.  Look for food products that have greater than 3 grams of fiber per serving on the Nutrition Facts label.  Take all medicine as directed by your caregiver.  If your caregiver has given you a follow-up appointment, it is very important that you go. Not going could result in lasting (chronic) or permanent injury, pain, and disability. If there is any problem keeping the appointment, call to reschedule. SEEK MEDICAL CARE IF:   Your pain does not improve.  You have a hard time advancing your diet beyond clear liquids.  Your bowel movements do not return to normal. SEEK IMMEDIATE MEDICAL CARE IF:   Your pain becomes worse.  You have an oral temperature above 102 F (38.9 C), not controlled by medicine.  You have repeated vomiting.  You have bloody or black, tarry stools.  Symptoms that brought you to your caregiver become worse or are not getting better. MAKE  SURE YOU:   Understand these instructions.  Will watch your condition.  Will get help right away if you are not doing well or get worse. Document Released: 11/17/2004 Document Revised: 05/02/2011 Document Reviewed: 03/15/2010 [facility] Patient Information 2014 [place].

## 2013-04-09 NOTE — ED Provider Notes (Signed)
CSN: [id number]     Arrival date & time 04/09/13  1123 History   First MD Initiated Contact with Patient 04/09/13 1225     Chief Complaint  Patient presents with  . Abdominal Pain     (Consider location/radiation/quality/duration/timing/severity/associated sxs/prior Treatment) HPI Comments: Patient is a 64 year old male with history of hypertension and diverticulitis. He presents today with complaints of lower abdominal pain and nausea for the past 2 weeks. He denies any fevers or chills. He denies any bloody stool. He denies any urinary complaints. He was diagnosed with diverticulitis several months ago and this feels similar.  Patient is a 62 y.o. male presenting with abdominal pain. The history is provided by the patient.  Abdominal Pain Pain location:  Generalized Pain quality: burning   Pain radiates to:  Does not radiate Pain severity:  Moderate Onset quality:  Gradual Duration:  1 week Timing:  Constant Progression:  Worsening Chronicity:  New Relieved by:  Nothing Worsened by:  Nothing tried Ineffective treatments:  None tried   Past Medical History  Diagnosis Date  . Hypertension   . Diverticulitis   . Kidney stone   . Depression    Past Surgical History  Procedure Laterality Date  . Back surgery    . Kidney stent     Family History  Problem Relation Age of Onset  . Cancer Other   . Diabetes Other    History  Substance Use Topics  . Smoking status: Current Every Day Smoker -- 1.00 packs/day for 40 years    Types: Cigarettes  . Smokeless tobacco: Never Used  . Alcohol Use: No    Review of Systems  Gastrointestinal: Positive for abdominal pain.  All other systems reviewed and are negative.      Allergies  Iohexol  Home Medications   Current Outpatient Rx  Name  Route  Sig  Dispense  Refill  . diazepam (VALIUM) 5 MG tablet   Oral   Take 5 mg by mouth daily. Anxiety         . lisinopril (PRINIVIL,ZESTRIL) 40 MG tablet   Oral   Take 40  mg by mouth daily.         Marland Kitchen omeprazole (PRILOSEC) 20 MG capsule   Oral   Take 20 mg by mouth daily.          There were no vitals taken for this visit. Physical Exam  Nursing note and vitals reviewed. Constitutional: He is oriented to person, place, and time. He appears well-developed and well-nourished. No distress.  HENT:  Head: Normocephalic and atraumatic.  Mouth/Throat: Oropharynx is clear and moist.  Neck: Normal range of motion. Neck supple.  Cardiovascular: Normal rate, regular rhythm and normal heart sounds.   No murmur heard. Pulmonary/Chest: Effort normal and breath sounds normal. No respiratory distress. He has no wheezes.  Abdominal: Soft. Bowel sounds are normal. He exhibits no distension and no mass. There is tenderness. There is no rebound and no guarding.  There is tenderness to palpation in the suprapubic region and left lower quadrant. There is no rebound and no guarding.  Musculoskeletal: Normal range of motion. He exhibits no edema.  Lymphadenopathy:    He has no cervical adenopathy.  Neurological: He is alert and oriented to person, place, and time.  Skin: Skin is warm and dry. He is not diaphoretic.    ED Course  Procedures (including critical care time) Labs Review Labs Reviewed  CBC WITH DIFFERENTIAL  URINALYSIS, ROUTINE W REFLEX MICROSCOPIC  COMPREHENSIVE METABOLIC PANEL  LIPASE, BLOOD   Imaging Review No results found.    MDM   Final diagnoses:  None    Patient is a 64 year old male with history of diverticulitis who presents with abdominal pain. He states this feels similar to his prior episodes of diverticulitis. He has not had any fever, bloody stool, or vomiting. His physical examination reveals tenderness suprapubically and in the left side of the abdomen. Workup reveals no significant abnormalities of the blood tests. He will be treated with Cipro and Flagyl for presumed diverticulitis. He is to return if his symptoms worsen or  change.    Veryl Speak, MD 04/09/13 [phone]

## 2013-04-09 NOTE — ED Notes (Signed)
Pt c/o intermittent diarrhea and abd pain for the past few weeks, Dr. Stark Jock in prior to RN< see edp assessment for further,

## 2013-04-09 NOTE — ED Notes (Signed)
Patient c/o intermittent lower abd pain with nausea x2 weeks. Patient denies any fever, vomiting, or diarrhea. Patient reports having diverticulitis 6 months ago and states "This kinda feels like that."

## 2013-04-09 NOTE — ED Notes (Signed)
Lab in room to recollect blood work, pt updated on plan of care,

## 2014-02-05 ENCOUNTER — Encounter (INDEPENDENT_AMBULATORY_CARE_PROVIDER_SITE_OTHER): Payer: Self-pay | Admitting: *Deleted

## 2014-03-18 ENCOUNTER — Ambulatory Visit (INDEPENDENT_AMBULATORY_CARE_PROVIDER_SITE_OTHER): Payer: 59 | Admitting: Internal Medicine

## 2014-03-18 ENCOUNTER — Encounter (INDEPENDENT_AMBULATORY_CARE_PROVIDER_SITE_OTHER): Payer: Self-pay | Admitting: Internal Medicine

## 2014-03-18 ENCOUNTER — Other Ambulatory Visit (INDEPENDENT_AMBULATORY_CARE_PROVIDER_SITE_OTHER): Payer: Self-pay | Admitting: *Deleted

## 2014-03-18 ENCOUNTER — Telehealth (INDEPENDENT_AMBULATORY_CARE_PROVIDER_SITE_OTHER): Payer: Self-pay | Admitting: *Deleted

## 2014-03-18 VITALS — BP 128/76 | HR 72 | Temp 98.2°F | Ht 70.0 in | Wt 198.8 lb

## 2014-03-18 DIAGNOSIS — Z800 Family history of malignant neoplasm of digestive organs: Secondary | ICD-10-CM

## 2014-03-18 DIAGNOSIS — Z86010 Personal history of colonic polyps: Secondary | ICD-10-CM | POA: Diagnosis not present

## 2014-03-18 DIAGNOSIS — I714 Abdominal aortic aneurysm, without rupture, unspecified: Secondary | ICD-10-CM | POA: Insufficient documentation

## 2014-03-18 DIAGNOSIS — Z1211 Encounter for screening for malignant neoplasm of colon: Secondary | ICD-10-CM

## 2014-03-18 DIAGNOSIS — I10 Essential (primary) hypertension: Secondary | ICD-10-CM | POA: Insufficient documentation

## 2014-03-18 MED ORDER — PEG-KCL-NACL-NASULF-NA ASC-C 100 G PO SOLR: 1.0000 | Freq: Once | ORAL | Status: DC

## 2014-03-18 NOTE — Progress Notes (Signed)
Subjective:    Patient ID: Jeremy Mclean, male    DOB: 01/11/1951, 64 y.o.   MRN: [id number]  HPI Referred to our office by Dr. Willey Blade for surveillance colonoscopy. Family hx of colon cancer in a father age 37.  Appetite is good. No weight loss. No abdominal pain.  He usually has a BM once a day.  No melena or BRRB. Occasionally takes Motrin for arthritic pain. States has appt with Pain Management to see if there are medications out there that will control his pain and not be addictive.  Retired from [employer]. Was a Psychologist, sport and exercise. Wife recently diagnosed with breast cancer. Has been under a lot of stress with this.  CBC    Component Value Date/Time   WBC 7.6 04/09/2013 1210   RBC 4.83 04/09/2013 1210   HGB 15.7 04/09/2013 1210   HCT 44.9 04/09/2013 1210   PLT 273 04/09/2013 1210   MCV 93.0 04/09/2013 1210   MCH 32.5 04/09/2013 1210   MCHC 35.0 04/09/2013 1210   RDW 14.1 04/09/2013 1210   LYMPHSABS 2.5 04/09/2013 1210   MONOABS 1.1* 04/09/2013 1210   EOSABS 0.3 04/09/2013 1210   BASOSABS 0.0 04/09/2013 1210   CMP     Component Value Date/Time   NA 141 04/09/2013 1254   K 4.0 04/09/2013 1254   CL 104 04/09/2013 1254   CO2 26 04/09/2013 1254   GLUCOSE 91 04/09/2013 1254   BUN 10 04/09/2013 1254   CREATININE 0.84 04/09/2013 1254   CALCIUM 9.0 04/09/2013 1254   PROT 6.9 04/09/2013 1254   ALBUMIN 3.5 04/09/2013 1254   AST 18 04/09/2013 1254   ALT 17 04/09/2013 1254   ALKPHOS 60 04/09/2013 1254   BILITOT 0.2* 04/09/2013 1254   GFRNONAA >90 04/09/2013 1254   GFRAA >90 04/09/2013 1254         08/27/2008 Colonoscopy: Dr. Teena Irani IMPRESSION: 1. Rectosigmoid polyp. 2. Left-sided diverticulosis. COLON, RECTOSIGMOID POLYP: TUBULAR ADENOMA. NO HIGH GRADE DYSPLASIA OR MALIGNANCY IDENTIFIED.     DATE OF PROCEDURE: 04/29/2003 PROCEDURE: Colonoscopy. Dr. Teena Irani. INDICATIONS FOR PROCEDURE: Family history of colon cancer in a first  degree relative. IMPRESSION: 1. Diverticulosis. 2. Small sigmoid colon polyp. SIGMOID COLON: HYPERPLASTIC POLYP(S). NO ADENOMATOUS CHANGE OR MALIGNANCY IDENTIFIED. Review of Systems Past Medical History  Diagnosis Date  . Hypertension   . Diverticulitis   . Kidney stone   . Depression     Past Surgical History  Procedure Laterality Date  . Back surgery    . Kidney stent      Allergies  Allergen Reactions  . Iohexol Hives, Itching and Other (See Comments)    Burning. Needs pre-meds     Current Outpatient Prescriptions on File Prior to Visit  Medication Sig Dispense Refill  . diazepam (VALIUM) 5 MG tablet Take 5 mg by mouth daily. Anxiety    . lisinopril (PRINIVIL,ZESTRIL) 40 MG tablet Take 40 mg by mouth daily.    Marland Kitchen omeprazole (PRILOSEC) 20 MG capsule Take 20 mg by mouth daily.     Current Facility-Administered Medications on File Prior to Visit  Medication Dose Route Frequency Provider Last Rate Last Dose  . rabies immune globulin (HYPERAB) injection 1,720 Units  1,720 Units Intramuscular Once Jennye Boroughs, PA-C      . rabies vaccine (RABAVERT/IMOVAX) injection 1 mL  1 mL Intramuscular Once Jennye Boroughs, PA-C            Objective:   Physical Exam  Filed Vitals:   03/18/14  1100  Height: 5\' 10"  (1.778 m)  Weight: 198 lb 12.8 oz (90.175 kg)    Alert and oriented. Skin warm and dry. Oral mucosa is moist.   . Sclera anicteric, conjunctivae is pink. Thyroid not enlarged. No cervical lymphadenopathy. Lungs clear. Heart regular rate and rhythm.  Abdomen is soft. Bowel sounds are positive. No hepatomegaly. No abdominal masses felt. No tenderness.  No edema to lower extremities.         Assessment & Plan:  Family hx of colon cancer. Personal hx of colon polyps. Plan: Colonoscopy. The risks and benefits such as perforation, bleeding, and infection were reviewed with the patient and is agreeable.

## 2014-03-18 NOTE — Telephone Encounter (Signed)
Patient needs movi prep 

## 2014-03-18 NOTE — Patient Instructions (Signed)
Colonoscopy.  The risks and benefits such as perforation, bleeding, and infection were reviewed with the patient and is agreeable. 

## 2014-03-31 DIAGNOSIS — J209 Acute bronchitis, unspecified: Secondary | ICD-10-CM | POA: Diagnosis not present

## 2014-04-09 ENCOUNTER — Encounter (HOSPITAL_COMMUNITY)
Admission: RE | Disposition: A | Discharge status: Home / Self Care | Payer: Self-pay | Source: Ambulatory Visit | Attending: Internal Medicine

## 2014-04-09 ENCOUNTER — Encounter (HOSPITAL_COMMUNITY): Payer: Self-pay

## 2014-04-09 ENCOUNTER — Ambulatory Visit (HOSPITAL_COMMUNITY)
Admission: RE | Admit: 2014-04-09 | Discharge: 2014-04-09 | Disposition: A | Discharge status: Home / Self Care | Payer: Medicare Other | Source: Ambulatory Visit | Attending: Internal Medicine | Admitting: Internal Medicine

## 2014-04-09 DIAGNOSIS — F329 Major depressive disorder, single episode, unspecified: Secondary | ICD-10-CM | POA: Diagnosis not present

## 2014-04-09 DIAGNOSIS — Z86010 Personal history of colonic polyps: Secondary | ICD-10-CM | POA: Diagnosis not present

## 2014-04-09 DIAGNOSIS — K644 Residual hemorrhoidal skin tags: Secondary | ICD-10-CM | POA: Insufficient documentation

## 2014-04-09 DIAGNOSIS — I10 Essential (primary) hypertension: Secondary | ICD-10-CM | POA: Diagnosis not present

## 2014-04-09 DIAGNOSIS — Z87442 Personal history of urinary calculi: Secondary | ICD-10-CM | POA: Diagnosis not present

## 2014-04-09 DIAGNOSIS — Z09 Encounter for follow-up examination after completed treatment for conditions other than malignant neoplasm: Secondary | ICD-10-CM | POA: Insufficient documentation

## 2014-04-09 DIAGNOSIS — F419 Anxiety disorder, unspecified: Secondary | ICD-10-CM | POA: Diagnosis not present

## 2014-04-09 DIAGNOSIS — Z800 Family history of malignant neoplasm of digestive organs: Secondary | ICD-10-CM | POA: Diagnosis not present

## 2014-04-09 DIAGNOSIS — F17210 Nicotine dependence, cigarettes, uncomplicated: Secondary | ICD-10-CM | POA: Insufficient documentation

## 2014-04-09 DIAGNOSIS — K219 Gastro-esophageal reflux disease without esophagitis: Secondary | ICD-10-CM | POA: Diagnosis not present

## 2014-04-09 DIAGNOSIS — M1990 Unspecified osteoarthritis, unspecified site: Secondary | ICD-10-CM | POA: Insufficient documentation

## 2014-04-09 DIAGNOSIS — K5730 Diverticulosis of large intestine without perforation or abscess without bleeding: Secondary | ICD-10-CM | POA: Insufficient documentation

## 2014-04-09 DIAGNOSIS — Z79899 Other long term (current) drug therapy: Secondary | ICD-10-CM | POA: Diagnosis not present

## 2014-04-09 DIAGNOSIS — K648 Other hemorrhoids: Secondary | ICD-10-CM | POA: Diagnosis not present

## 2014-04-09 HISTORY — DX: Gastro-esophageal reflux disease without esophagitis: K21.9

## 2014-04-09 HISTORY — PX: COLONOSCOPY: SHX5424

## 2014-04-09 HISTORY — DX: Anxiety disorder, unspecified: F41.9

## 2014-04-09 HISTORY — DX: Unspecified osteoarthritis, unspecified site: M19.90

## 2014-04-09 SURGERY — COLONOSCOPY
Anesthesia: Moderate Sedation

## 2014-04-09 MED ORDER — MIDAZOLAM HCL 5 MG/5ML IJ SOLN
INTRAMUSCULAR | Status: AC
  Filled 2014-04-09: qty 10

## 2014-04-09 MED ORDER — STERILE WATER FOR IRRIGATION IR SOLN
Status: DC | PRN
  Administered 2014-04-09: 08:00:00

## 2014-04-09 MED ORDER — MEPERIDINE HCL 50 MG/ML IJ SOLN
INTRAMUSCULAR | Status: DC | PRN
  Administered 2014-04-09 (×2): 25 mg via INTRAVENOUS

## 2014-04-09 MED ORDER — SODIUM CHLORIDE 0.9 % IV SOLN
INTRAVENOUS | Status: DC
  Administered 2014-04-09: 07:00:00 via INTRAVENOUS

## 2014-04-09 MED ORDER — MEPERIDINE HCL 50 MG/ML IJ SOLN
INTRAMUSCULAR | Status: AC
  Filled 2014-04-09: qty 1

## 2014-04-09 MED ORDER — MIDAZOLAM HCL 5 MG/5ML IJ SOLN
INTRAMUSCULAR | Status: DC | PRN
  Administered 2014-04-09 (×4): 2 mg via INTRAVENOUS
  Administered 2014-04-09: 1 mg via INTRAVENOUS

## 2014-04-09 NOTE — Discharge Instructions (Signed)
Resume usual medications and high fiber diet. Benefiber 4 g by mouth daily at bedtime. No driving for 24 hours. Next colonoscopy in 5 years.  Colonoscopy, Care After Refer to this sheet in the next few weeks. These instructions provide you with information on caring for yourself after your procedure. Your health care provider may also give you more specific instructions. Your treatment has been planned according to current medical practices, but problems sometimes occur. Call your health care provider if you have any problems or questions after your procedure. WHAT TO EXPECT AFTER THE PROCEDURE  After your procedure, it is typical to have the following:  A small amount of blood in your stool.  Moderate amounts of gas and mild abdominal cramping or bloating. HOME CARE INSTRUCTIONS  Do not drive, operate machinery, or sign important documents for 24 hours.  You may shower and resume your regular physical activities, but move at a slower pace for the first 24 hours.  Take frequent rest periods for the first 24 hours.  Walk around or put a warm pack on your abdomen to help reduce abdominal cramping and bloating.  Drink enough fluids to keep your urine clear or pale yellow.  You may resume your normal diet as instructed by your health care provider. Avoid heavy or fried foods that are hard to digest.  Avoid drinking alcohol for 24 hours or as instructed by your health care provider.  Only take over-the-counter or prescription medicines as directed by your health care provider.  If a tissue sample (biopsy) was taken during your procedure:  Do not take aspirin or blood thinners for 7 days, or as instructed by your health care provider.  Do not drink alcohol for 7 days, or as instructed by your health care provider.  Eat soft foods for the first 24 hours. SEEK MEDICAL CARE IF: You have persistent spotting of blood in your stool 2-3 days after the procedure. SEEK IMMEDIATE MEDICAL CARE  IF:  You have more than a small spotting of blood in your stool.  You pass large blood clots in your stool.  Your abdomen is swollen (distended).  You have nausea or vomiting.  You have a fever.  You have increasing abdominal pain that is not relieved with medicine. Document Released: 09/22/2003 Document Revised: 11/28/2012 Document Reviewed: 10/15/2012 [facility] Patient Information 2015 [place], [place]. This information is not intended to replace advice given to you by your health care provider. Make sure you discuss any questions you have with your health care provider. High-Fiber Diet Fiber is found in fruits, vegetables, and grains. A high-fiber diet encourages the addition of more whole grains, legumes, fruits, and vegetables in your diet. The recommended amount of fiber for adult males is 38 g per day. For adult females, it is 25 g per day. Pregnant and lactating women should get 28 g of fiber per day. If you have a digestive or bowel problem, ask your caregiver for advice before adding high-fiber foods to your diet. Eat a variety of high-fiber foods instead of only a select few type of foods.  PURPOSE  To increase stool bulk.  To make bowel movements more regular to prevent constipation.  To lower cholesterol.  To prevent overeating. WHEN IS THIS DIET USED?  It may be used if you have constipation and hemorrhoids.  It may be used if you have uncomplicated diverticulosis (intestine condition) and irritable bowel syndrome.  It may be used if you need help with weight management.  It may  be used if you want to add it to your diet as a protective measure against atherosclerosis, diabetes, and cancer. SOURCES OF FIBER  Whole-grain breads and cereals.  Fruits, such as apples, oranges, bananas, berries, prunes, and pears.  Vegetables, such as green peas, carrots, sweet potatoes, beets, broccoli, cabbage, spinach, and artichokes.  Legumes, such split peas, soy,  lentils.  Almonds. FIBER CONTENT IN FOODS Starches and Grains / Dietary Fiber (g)  Cheerios, 1 cup / 3 g  Corn Flakes cereal, 1 cup / 0.7 g  Rice crispy treat cereal, 1 cup / 0.3 g  Instant oatmeal (cooked),  cup / 2 g  Frosted wheat cereal, 1 cup / 5.1 g  Brown, long-grain rice (cooked), 1 cup / 3.5 g  White, long-grain rice (cooked), 1 cup / 0.6 g  Enriched macaroni (cooked), 1 cup / 2.5 g Legumes / Dietary Fiber (g)  Baked beans (canned, plain, or vegetarian),  cup / 5.2 g  Kidney beans (canned),  cup / 6.8 g  Pinto beans (cooked),  cup / 5.5 g Breads and Crackers / Dietary Fiber (g)  Plain or honey graham crackers, 2 squares / 0.7 g  Saltine crackers, 3 squares / 0.3 g  Plain, salted pretzels, 10 pieces / 1.8 g  Whole-wheat bread, 1 slice / 1.9 g  White bread, 1 slice / 0.7 g  Raisin bread, 1 slice / 1.2 g  Plain bagel, 3 oz / 2 g  Flour tortilla, 1 oz / 0.9 g  Corn tortilla, 1 small / 1.5 g  Hamburger or hotdog bun, 1 small / 0.9 g Fruits / Dietary Fiber (g)  Apple with skin, 1 medium / 4.4 g  Sweetened applesauce,  cup / 1.5 g  Banana,  medium / 1.5 g  Grapes, 10 grapes / 0.4 g  Orange, 1 small / 2.3 g  Raisin, 1.5 oz / 1.6 g  Melon, 1 cup / 1.4 g Vegetables / Dietary Fiber (g)  Green beans (canned),  cup / 1.3 g  Carrots (cooked),  cup / 2.3 g  Broccoli (cooked),  cup / 2.8 g  Peas (cooked),  cup / 4.4 g  Mashed potatoes,  cup / 1.6 g  Lettuce, 1 cup / 0.5 g  Corn (canned),  cup / 1.6 g  Tomato,  cup / 1.1 g Document Released: 02/07/2005 Document Revised: 08/09/2011 Document Reviewed: 05/12/2011 [employer] Patient Information 2015 [place], [place]. This information is not intended to replace advice given to you by your health care provider. Make sure you discuss any questions you have with your health care provider.

## 2014-04-09 NOTE — H&P (Signed)
Jeremy Mclean is an 64 y.o. male.   Chief Complaint: Patient is here for colonoscopy. HPI: Patient is 64-year-old Caucasian male who was history of colonic adenoma and family history of CRC in father. He is here for surveillance colonoscopy. Last exam was in 2010 with removal of one polyp which is tubular adenoma. He denies abdominal pain change in bowel habits or rectal bleeding.  Past Medical History  Diagnosis Date  . Hypertension   . Diverticulitis   . Kidney stone   . Depression   . Anxiety   . GERD (gastroesophageal reflux disease)   . Arthritis     Past Surgical History  Procedure Laterality Date  . Kidney stent    . Back surgery      cervical fusion  . Cystoscopy      with stent    Family History  Problem Relation Age of Onset  . Cancer Other   . Diabetes Other    Social History:  reports that he has been smoking Cigarettes.  He has a 40 pack-year smoking history. He has never used smokeless tobacco. He reports that he does not drink alcohol or use illicit drugs.  Allergies:  Allergies  Allergen Reactions  . Iohexol Hives, Itching and Other (See Comments)    Burning. Needs pre-meds     Medications Prior to Admission  Medication Sig Dispense Refill  . amLODipine (NORVASC) 10 MG tablet Take 10 mg by mouth daily.    . diazepam (VALIUM) 5 MG tablet Take 5 mg by mouth daily. Anxiety    . lisinopril (PRINIVIL,ZESTRIL) 40 MG tablet Take 40 mg by mouth daily.    . omeprazole (PRILOSEC) 20 MG capsule Take 20 mg by mouth daily.    . peg 3350 powder (MOVIPREP) 100 G SOLR Take 1 kit (200 g total) by mouth once. 1 kit 0    No results found for this or any previous visit (from the past 48 hour(s)). No results found.  ROS  Blood pressure 125/88, pulse 63, temperature 98.3 F (36.8 C), temperature source Oral, resp. rate 18, height 5' 10" (1.778 m), weight 198 lb (89.812 kg), SpO2 96 %. Physical Exam  Constitutional: He appears well-developed and well-nourished.   HENT:  Mouth/Throat: Oropharynx is clear and moist.  Eyes: Conjunctivae are normal. No scleral icterus.  Neck: No thyromegaly present.  Cardiovascular: Normal rate, regular rhythm and normal heart sounds.   No murmur heard. Respiratory: Effort normal and breath sounds normal.  GI: Soft. He exhibits no distension and no mass. There is no tenderness.  Musculoskeletal: He exhibits no edema.  Lymphadenopathy:    He has no cervical adenopathy.  Neurological: He is alert.  Skin: Skin is warm and dry.     Assessment/Plan History of colonic adenoma. Family history of CRC in father at late onset(79 at the time of diagnosis). Surveillance colonoscopy  [name],[name] 04/09/2014, 7:31 AM    

## 2014-04-09 NOTE — Op Note (Signed)
COLONOSCOPY PROCEDURE REPORT  PATIENT:  Jeremy Mclean  MR#:  [id number] Birthdate:  September 23, 1950, 64 y.o., male Endoscopist:  Dr. Rogene Houston, MD Referred By:  Dr. Asencion Noble, MD  Procedure Date: 04/09/2014  Procedure:   Colonoscopy  Indications:  Patient is 64 year old Caucasian male with history of colonic adenoma and family history of colon carcinoma in father at late onset. Last colonoscopy was in July 2010 in [place] with removal of single tubular adenoma.  Informed Consent:  The procedure and risks were reviewed with the patient and informed consent was obtained.  Medications:  Demerol 50 mg IV Versed 9 mg IV  Description of procedure:  After a digital rectal exam was performed, that colonoscope was advanced from the anus through the rectum and colon to the area of the cecum, ileocecal valve and appendiceal orifice. The cecum was deeply intubated. These structures were well-seen and photographed for the record. From the level of the cecum and ileocecal valve, the scope was slowly and cautiously withdrawn. The mucosal surfaces were carefully surveyed utilizing scope tip to flexion to facilitate fold flattening as needed. The scope was pulled down into the rectum where a thorough exam including retroflexion was performed. Terminal ileum was also examined.  Findings:   Prep excellent. Normal mucosa of terminal ileum. Diverticula throughout the colon most of these are located at sigmoid colon. Normal rectal mucosa. Small hemorrhoids below the dentate line.   Therapeutic/Diagnostic Maneuvers Performed:   None  Complications:  None  Cecal Withdrawal Time:  9 minutes  Impression:  Normal mucosa of terminal ileum. Pancolonic diverticulosis. Most of the diverticula at sigmoid colon and very few proximal to splenic flexure. No evidence of recurrent polyps. Small external hemorrhoids.  Recommendations:  Standard instructions given. High fiber diet. Benefiber 4 g by mouth  daily at bedtime. Next colonoscopy in 5 years.  [facility]  04/09/2014 8:10 AM  CC: Dr. Asencion Noble, MD & Dr. Rayne Du ref. provider found

## 2014-04-10 ENCOUNTER — Encounter (HOSPITAL_COMMUNITY): Payer: Self-pay | Admitting: Internal Medicine

## 2014-06-04 DIAGNOSIS — K219 Gastro-esophageal reflux disease without esophagitis: Secondary | ICD-10-CM | POA: Diagnosis not present

## 2014-06-04 DIAGNOSIS — E785 Hyperlipidemia, unspecified: Secondary | ICD-10-CM | POA: Diagnosis not present

## 2014-06-04 DIAGNOSIS — I10 Essential (primary) hypertension: Secondary | ICD-10-CM | POA: Diagnosis not present

## 2014-06-04 DIAGNOSIS — Z79899 Other long term (current) drug therapy: Secondary | ICD-10-CM | POA: Diagnosis not present

## 2014-06-12 DIAGNOSIS — M2550 Pain in unspecified joint: Secondary | ICD-10-CM | POA: Diagnosis not present

## 2014-06-12 DIAGNOSIS — Z79899 Other long term (current) drug therapy: Secondary | ICD-10-CM | POA: Diagnosis not present

## 2014-06-12 DIAGNOSIS — E785 Hyperlipidemia, unspecified: Secondary | ICD-10-CM | POA: Diagnosis not present

## 2014-06-12 DIAGNOSIS — I10 Essential (primary) hypertension: Secondary | ICD-10-CM | POA: Diagnosis not present

## 2014-06-12 DIAGNOSIS — I723 Aneurysm of iliac artery: Secondary | ICD-10-CM | POA: Diagnosis not present

## 2014-06-18 ENCOUNTER — Encounter: Payer: Self-pay | Admitting: Vascular Surgery

## 2014-06-18 ENCOUNTER — Other Ambulatory Visit: Payer: Self-pay | Admitting: *Deleted

## 2014-06-18 DIAGNOSIS — I723 Aneurysm of iliac artery: Secondary | ICD-10-CM

## 2014-07-10 ENCOUNTER — Encounter: Payer: Self-pay | Admitting: Vascular Surgery

## 2014-07-11 ENCOUNTER — Encounter: Payer: Self-pay | Admitting: Vascular Surgery

## 2014-07-11 ENCOUNTER — Other Ambulatory Visit: Payer: Self-pay | Admitting: Vascular Surgery

## 2014-07-11 ENCOUNTER — Ambulatory Visit (INDEPENDENT_AMBULATORY_CARE_PROVIDER_SITE_OTHER): Payer: Medicare Other | Admitting: Vascular Surgery

## 2014-07-11 ENCOUNTER — Ambulatory Visit (HOSPITAL_COMMUNITY)
Admission: RE | Admit: 2014-07-11 | Discharge: 2014-07-11 | Disposition: A | Discharge status: Home / Self Care | Payer: Medicare Other | Source: Ambulatory Visit | Attending: Vascular Surgery | Admitting: Vascular Surgery

## 2014-07-11 VITALS — BP 139/95 | HR 61 | Ht 70.0 in | Wt 202.0 lb

## 2014-07-11 DIAGNOSIS — I723 Aneurysm of iliac artery: Secondary | ICD-10-CM

## 2014-07-11 DIAGNOSIS — Z720 Tobacco use: Secondary | ICD-10-CM | POA: Diagnosis not present

## 2014-07-11 DIAGNOSIS — I714 Abdominal aortic aneurysm, without rupture, unspecified: Secondary | ICD-10-CM

## 2014-07-11 NOTE — Progress Notes (Signed)
Referred by: Asencion Noble, MD [address] [place], [place] [address]  Reason for referral: AAA  History of Present Illness  The patient is a 64 y.o. (08/27/1950) male who presents with chief complaint: known right iliac artery aneurysm.  Previous CT done to evaluate the lumbar spine demonstrate an R CIA aneurysm measuring 2.3 cm and L CIA aneurysm 1.3.  The patient does have back abdominal pain intermittently.  The patient does not have history of embolic episodes from the AAA.  The patient's risk factors for AAA included: age, sex, family history (dad) and active smoking.  The patient does actively smoke cigarettes.  Past Medical History  Diagnosis Date  . Hypertension   . Diverticulitis   . Kidney stone   . Depression   . Anxiety   . GERD (gastroesophageal reflux disease)   . Arthritis     Past Surgical History  Procedure Laterality Date  . Kidney stent    . Back surgery      cervical fusion  . Cystoscopy      with stent  . Colonoscopy N/A 04/09/2014    Procedure: COLONOSCOPY;  Surgeon: Rogene Houston, MD;  Location: [facility];  Service: Endoscopy;  Laterality: N/A;  730 - moved to 7:30 - Ann to notify pt    History   Social History  . Marital Status: Married    Spouse Name: N/A  . Number of Children: N/A  . Years of Education: N/A   Occupational History  . Not on file.   Social History Main Topics  . Smoking status: Current Every Day Smoker -- 1.00 packs/day for 40 years    Types: Cigarettes  . Smokeless tobacco: Never Used  . Alcohol Use: No  . Drug Use: No  . Sexual Activity: Yes    Birth Control/ Protection: None   Other Topics Concern  . Not on file   Social History Narrative    Family History  Problem Relation Age of Onset  . Cancer Other   . Diabetes Other   . Diabetes Mother   . Cancer Father     Current Outpatient Prescriptions on File Prior to Visit  Medication Sig Dispense Refill  . amLODipine (NORVASC) 10 MG tablet  Take 10 mg by mouth daily.    . diazepam (VALIUM) 5 MG tablet Take 5 mg by mouth daily. Anxiety    . lisinopril (PRINIVIL,ZESTRIL) 40 MG tablet Take 40 mg by mouth daily.    Marland Kitchen omeprazole (PRILOSEC) 20 MG capsule Take 20 mg by mouth daily.     Current Facility-Administered Medications on File Prior to Visit  Medication Dose Route Frequency Provider Last Rate Last Dose  . rabies immune globulin (HYPERAB) injection 1,720 Units  1,720 Units Intramuscular Once Duaine Dredge, PA-C      . rabies vaccine (RABAVERT/IMOVAX) injection 1 mL  1 mL Intramuscular Once Duaine Dredge, PA-C        Allergies  Allergen Reactions  . Iohexol Hives, Itching and Other (See Comments)    Burning. Needs pre-meds     REVIEW OF SYSTEMS:  (Positives checked otherwise negative)  CARDIOVASCULAR:  []  chest pain, []  chest pressure, [x]  palpitations, []  shortness of breath when laying flat, [x]  shortness of breath with exertion,  []  pain in feet when walking, [x]  pain in feet when laying flat, []  history of blood clot in veins (DVT), []  history of phlebitis, []  swelling in legs, []  varicose veins  PULMONARY:  [x]   productive cough, [x]  asthma, []  wheezing  NEUROLOGIC:  [x]  weakness in arms or legs, []  numbness in arms or legs, []  difficulty speaking or slurred speech, []  temporary loss of vision in one eye, []  dizziness  HEMATOLOGIC:  []  bleeding problems, []  problems with blood clotting too easily  MUSCULOSKEL:  []  joint pain, []  joint swelling  GASTROINTEST:  []  vomiting blood, []  blood in stool     GENITOURINARY:  []  burning with urination, []  blood in urine  PSYCHIATRIC:  []  history of major depression  INTEGUMENTARY:  []  rashes, []  ulcers  CONSTITUTIONAL:  []  fever, []  chills  For VQI Use Only  PRE-ADM LIVING: Home  AMB STATUS: Ambulatory  CAD Sx: None  PRIOR CHF: None  STRESS TEST: [x]  No, [ ]  Normal, [ ]  + ischemia, [ ]  + MI, [ ]  Both   Physical Examination  Filed Vitals:   07/11/14  1110  BP: 139/95  Pulse: 61  Height: 5\' 10"  (1.778 m)  Weight: 202 lb (91.627 kg)  SpO2: 97%   Body mass index is 28.98 kg/(m^2).  General: A&O x 3, WDWN  Head: [place]/AT  Ear/Nose/Throat: Hearing grossly intact, nares w/o erythema or drainage, oropharynx w/o Erythema/Exudate  Eyes: PERRLA, EOMI  Neck: Supple, no nuchal rigidity, no palpable LAD  Pulmonary: Sym exp, good air movt, CTAB, no rales, rhonchi, & wheezing  Cardiac: RRR, Nl S1, S2, no Murmurs, rubs or gallops  Vascular: Vessel Right Left  Radial Palpable Palpable  Brachial Palpable Palpable  Carotid Palpable, without bruit Palpable, without bruit  Aorta Not palpable N/A  Femoral Palpable Palpable  Popliteal Not palpable Not palpable  PT Palpable Palpable  DP Palpable Palpable   Gastrointestinal: soft, NTND, -G/R, - HSM, - masses, - CVAT B  Musculoskeletal: M/S 5/5 throughout , Extremities without ischemic changes   Neurologic: CN 2-12 intact , Pain and light touch intact in extremities , Motor exam as listed above  Psychiatric: Judgment intact, Mood & affect appropriate for pt's clinical situation  Dermatologic: See M/S exam for extremity exam, no rashes otherwise noted  Lymph : No Cervical, Axillary, or Inguinal lymphadenopathy   Non-Invasive Vascular Imaging  AAA Duplex (Date: 07/11/2014 )  Aorta: 3.4 x 3.4 cm   R CIA: 2.4 x 2.3 cm  L CIA: 1.8 x 1.8 cm  Outside Studies/Documentation 8 pages of outside documents were reviewed including: outside PCP chart and CT report.  Medical Decision Making  The patient is a 64 y.o. male who presents with: small asx AAA and small asx B CIA aneurysm   Based on this patient's exam and diagnostic studies, he needs: q12 month surveillance  The threshold for repair is AAA size > 5.5 cm, iliac artery aneurysm > 3.5 cm, growth > 1 cm/yr, and symptomatic status.  If either the AAA or iliac artery aneurysm reach size threshold, all three would be repaired  together.  I emphasized the importance of maximal medical management including strict control of blood pressure, blood glucose, and lipid levels, antiplatelet agents, obtaining regular exercise, and cessation of smoking.    Thank you for allowing [place] to participate in this patient's care.  Adele Barthel, MD [facility] Office: [phone] Pager: [phone]  07/11/2014, 11:33 AM

## 2014-07-14 NOTE — Addendum Note (Signed)
Addended by: Reola Calkins on: 07/14/2014 04:05 PM   Modules accepted: Orders

## 2014-08-01 DIAGNOSIS — K5792 Diverticulitis of intestine, part unspecified, without perforation or abscess without bleeding: Secondary | ICD-10-CM | POA: Diagnosis not present

## 2014-12-02 DIAGNOSIS — L820 Inflamed seborrheic keratosis: Secondary | ICD-10-CM | POA: Diagnosis not present

## 2014-12-02 DIAGNOSIS — Z1283 Encounter for screening for malignant neoplasm of skin: Secondary | ICD-10-CM | POA: Diagnosis not present

## 2015-01-16 ENCOUNTER — Other Ambulatory Visit (HOSPITAL_COMMUNITY): Payer: Medicare Other

## 2015-01-16 ENCOUNTER — Ambulatory Visit: Payer: Medicare Other | Admitting: Vascular Surgery

## 2015-01-26 DIAGNOSIS — M1990 Unspecified osteoarthritis, unspecified site: Secondary | ICD-10-CM | POA: Diagnosis not present

## 2015-01-26 DIAGNOSIS — Z6834 Body mass index (BMI) 34.0-34.9, adult: Secondary | ICD-10-CM | POA: Diagnosis not present

## 2015-01-27 DIAGNOSIS — R899 Unspecified abnormal finding in specimens from other organs, systems and tissues: Secondary | ICD-10-CM | POA: Diagnosis not present

## 2015-03-09 DIAGNOSIS — Z6832 Body mass index (BMI) 32.0-32.9, adult: Secondary | ICD-10-CM | POA: Diagnosis not present

## 2015-03-09 DIAGNOSIS — R05 Cough: Secondary | ICD-10-CM | POA: Diagnosis not present

## 2015-03-09 DIAGNOSIS — R062 Wheezing: Secondary | ICD-10-CM | POA: Diagnosis not present

## 2015-07-17 ENCOUNTER — Ambulatory Visit: Payer: Medicare Other | Admitting: Vascular Surgery

## 2015-07-17 ENCOUNTER — Ambulatory Visit (HOSPITAL_COMMUNITY)
Admission: RE | Admit: 2015-07-17 | Discharge: 2015-07-17 | Disposition: A | Discharge status: Home / Self Care | Payer: Medicare Other | Source: Ambulatory Visit | Attending: Vascular Surgery | Admitting: Vascular Surgery

## 2015-07-17 DIAGNOSIS — I10 Essential (primary) hypertension: Secondary | ICD-10-CM | POA: Insufficient documentation

## 2015-07-17 DIAGNOSIS — F329 Major depressive disorder, single episode, unspecified: Secondary | ICD-10-CM | POA: Insufficient documentation

## 2015-07-17 DIAGNOSIS — I714 Abdominal aortic aneurysm, without rupture, unspecified: Secondary | ICD-10-CM

## 2015-07-17 DIAGNOSIS — I723 Aneurysm of iliac artery: Secondary | ICD-10-CM | POA: Insufficient documentation

## 2015-07-17 DIAGNOSIS — K219 Gastro-esophageal reflux disease without esophagitis: Secondary | ICD-10-CM | POA: Diagnosis not present

## 2015-07-17 DIAGNOSIS — F419 Anxiety disorder, unspecified: Secondary | ICD-10-CM | POA: Insufficient documentation

## 2015-07-24 ENCOUNTER — Encounter: Payer: Self-pay | Admitting: Vascular Surgery

## 2015-07-30 NOTE — Progress Notes (Signed)
Established Abdominal Aortic Aneurysm  History of Present Illness  The patient is a 65 y.o. (1950-05-12) male who presents with chief complaint: follow up for AAA.  Previous studies demonstrate an AAA, measuring 3.4 cm, and R CIA 2.4 cm and L CIA 1.8 cm.  The patient does not have back or abdominal pain.  The patient is an active smoker.  The patient's PMH, PSH, SH, and FamHx are unchanged from 07/11/14.  Current Outpatient Prescriptions  Medication Sig Dispense Refill  . amLODipine (NORVASC) 10 MG tablet Take 10 mg by mouth daily.    . chlorthalidone (HYGROTON) 25 MG tablet     . diazepam (VALIUM) 5 MG tablet Take 5 mg by mouth daily. Anxiety    . lisinopril (PRINIVIL,ZESTRIL) 40 MG tablet Take 40 mg by mouth daily.    Marland Kitchen omeprazole (PRILOSEC) 20 MG capsule Take 20 mg by mouth daily.     No current facility-administered medications for this visit.   Facility-Administered Medications Ordered in Other Visits  Medication Dose Route Frequency Provider Last Rate Last Dose  . rabies immune globulin (HYPERAB) injection 1,720 Units  1,720 Units Intramuscular Once Duaine Dredge, PA-C      . rabies vaccine (RABAVERT/IMOVAX) injection 1 mL  1 mL Intramuscular Once Duaine Dredge, PA-C        Allergies  Allergen Reactions  . Iohexol Hives, Itching and Other (See Comments)    Burning. Needs pre-meds     On ROS today: continued smoking, no embolic sx   Physical Examination  Filed Vitals:   07/31/15 1030  BP: 114/78  Pulse: 68  Temp: 96.9 F (36.1 C)  TempSrc: Oral  Resp: 16  Height: 5\' 8"  (1.727 m)  Weight: 201 lb (91.173 kg)  SpO2: 98%   Body mass index is 30.57 kg/(m^2).  General: A&O x 3, WDWN  Pulmonary: Sym exp, good air movt, CTAB, no rales, rhonchi, & wheezing  Cardiac: RRR, Nl S1, S2, no Murmurs, rubs or gallops  Vascular: Vessel Right Left  Radial Palpable Palpable  Brachial Palpable Palpable  Carotid Palpable, without bruit Palpable, without bruit    Aorta Not palpable due to mod pannus N/A  Femoral Palpable Palpable  Popliteal Not palpable Not palpable  PT Palpable Palpable  DP Palpable Palpable   Gastrointestinal: soft, NTND, no G/R, no HSM, no masses, no CVAT B  Musculoskeletal: M/S 5/5 throughout , Extremities without ischemic changes   Neurologic:  Pain and light touch intact in extremities , Motor exam as listed above  Non-Invasive Vascular Imaging  AAA Duplex (07/30/2015)  Current size:  3.5 cm x 3.6 cm   R CIA 3.5 cm x 3.5 cm  L CIA: 1.8 cm x 1.8 cm   Medical Decision Making  The patient is a 65 y.o. male who presents with: asymptomatic small AAA with ncreasing size, small L CIA aneurysm, large R CIA aneurysm   Based on this patient's exam and diagnostic studies, he needs CTA abd/pelvis to determine if EVAR candidate as his R CIA meets repair criteria.  Additionally, will send him for cardiology preop evaluation to facilitate Anesthesiology pre-op evaluation.  The threshold for repair is AAA size > 5.5 cm, growth > 1 cm/yr, symptomatic status, and iliac artery size > 3.5 cm.  I emphasized the importance of maximal medical management including strict control of blood pressure, blood glucose, and lipid levels, antiplatelet agents, obtaining regular exercise, and cessation of smoking.    Thank you for allowing [place] to  participate in this patient's care.   Adele Barthel, MD, FACS [facility] Office: [phone] Pager: [phone]

## 2015-07-31 ENCOUNTER — Ambulatory Visit (INDEPENDENT_AMBULATORY_CARE_PROVIDER_SITE_OTHER): Payer: Medicare Other | Admitting: Vascular Surgery

## 2015-07-31 ENCOUNTER — Other Ambulatory Visit: Payer: Self-pay

## 2015-07-31 ENCOUNTER — Telehealth: Payer: Self-pay | Admitting: Vascular Surgery

## 2015-07-31 ENCOUNTER — Encounter: Payer: Self-pay | Admitting: Vascular Surgery

## 2015-07-31 VITALS — BP 114/78 | HR 68 | Temp 96.9°F | Resp 16 | Ht 68.0 in | Wt 201.0 lb

## 2015-07-31 DIAGNOSIS — Z91041 Radiographic dye allergy status: Secondary | ICD-10-CM

## 2015-07-31 DIAGNOSIS — I714 Abdominal aortic aneurysm, without rupture, unspecified: Secondary | ICD-10-CM

## 2015-07-31 DIAGNOSIS — Z01818 Encounter for other preprocedural examination: Secondary | ICD-10-CM

## 2015-07-31 DIAGNOSIS — I723 Aneurysm of iliac artery: Secondary | ICD-10-CM | POA: Diagnosis not present

## 2015-07-31 MED ORDER — PREDNISONE 50 MG PO TABS: ORAL_TABLET | ORAL | Status: DC

## 2015-07-31 MED ORDER — DIPHENHYDRAMINE HCL 50 MG PO CAPS: ORAL_CAPSULE | ORAL | Status: DC

## 2015-07-31 NOTE — Telephone Encounter (Signed)
Spoke with patient's wife - gave her the following: CTA scheduled for 08/05/15 at 12:45 pm at [facility] - npo 4 hrs prior (will be taking premed, sent to pharmacy by carol) Cardiac Clearance with Dr. Johnsie Cancel in [place] 08/06/15 10:15 am Follow up with Dr. Bridgett Larsson on 08/21/15 at 9:15 am. Wife verbalized understanding.

## 2015-07-31 NOTE — Addendum Note (Signed)
Addended by: Thresa Ross C on: 07/31/2015 03:16 PM   Modules accepted: Orders

## 2015-08-03 ENCOUNTER — Other Ambulatory Visit: Payer: Self-pay | Admitting: Vascular Surgery

## 2015-08-03 DIAGNOSIS — I723 Aneurysm of iliac artery: Secondary | ICD-10-CM | POA: Diagnosis not present

## 2015-08-03 DIAGNOSIS — Z01812 Encounter for preprocedural laboratory examination: Secondary | ICD-10-CM | POA: Diagnosis not present

## 2015-08-03 DIAGNOSIS — I714 Abdominal aortic aneurysm, without rupture: Secondary | ICD-10-CM | POA: Diagnosis not present

## 2015-08-03 LAB — CREATININE, SERUM: Creat: 1.01 mg/dL (ref 0.70–1.25)

## 2015-08-05 ENCOUNTER — Ambulatory Visit (HOSPITAL_COMMUNITY)
Admission: RE | Admit: 2015-08-05 | Discharge: 2015-08-05 | Disposition: A | Discharge status: Home / Self Care | Payer: Medicare Other | Source: Ambulatory Visit | Attending: Vascular Surgery | Admitting: Vascular Surgery

## 2015-08-05 ENCOUNTER — Telehealth: Payer: Self-pay

## 2015-08-05 ENCOUNTER — Encounter (HOSPITAL_COMMUNITY): Payer: Self-pay

## 2015-08-05 DIAGNOSIS — Z01818 Encounter for other preprocedural examination: Secondary | ICD-10-CM | POA: Diagnosis not present

## 2015-08-05 DIAGNOSIS — I714 Abdominal aortic aneurysm, without rupture, unspecified: Secondary | ICD-10-CM

## 2015-08-05 DIAGNOSIS — I723 Aneurysm of iliac artery: Secondary | ICD-10-CM | POA: Diagnosis not present

## 2015-08-05 DIAGNOSIS — K5790 Diverticulosis of intestine, part unspecified, without perforation or abscess without bleeding: Secondary | ICD-10-CM | POA: Diagnosis not present

## 2015-08-05 DIAGNOSIS — N200 Calculus of kidney: Secondary | ICD-10-CM | POA: Insufficient documentation

## 2015-08-05 MED ORDER — IOPAMIDOL (ISOVUE-370) INJECTION 76%
100.0000 mL | Freq: Once | INTRAVENOUS | Status: AC | PRN
  Administered 2015-08-05: 100 mL via INTRAVENOUS

## 2015-08-05 NOTE — Telephone Encounter (Signed)
Phone call from pt.  Reported he is taking the Prednisone and Benadryl pre-med due to IV contrast allergy.  Reported he took the 1st dose of Prednisone last night at 11:45 PM, and the 2nd dose this morning.  Reported he is feeling nervous, and broke out in a sweat about 10:15 AM.  Questioned if he should take his 3rd dose of Prednisone, 1 hr. Prior to the CTA.  Questioned if he has taken Prednisone before; stated he has and that it had this same effect.  Reassured the pt. that a common side effect of Prednisone is feeling more excitable and increased energy.  The pt. Reported he was feeling better at this time, and felt like he could go ahead and take his 3rd dose.  Advised that he will be taking Benadryl 50 mg. With the 3rd dose of Prednisone, and may feel calmer with the Benadryl in his system.  Pt. Voiced understanding.  Denied any other adverse symptoms at this time.

## 2015-08-06 ENCOUNTER — Encounter: Payer: Self-pay | Admitting: *Deleted

## 2015-08-06 ENCOUNTER — Encounter: Payer: Self-pay | Admitting: Cardiovascular Disease

## 2015-08-06 ENCOUNTER — Ambulatory Visit (INDEPENDENT_AMBULATORY_CARE_PROVIDER_SITE_OTHER): Payer: Medicare Other | Admitting: Cardiovascular Disease

## 2015-08-06 ENCOUNTER — Other Ambulatory Visit (HOSPITAL_COMMUNITY): Payer: Self-pay | Admitting: Respiratory Therapy

## 2015-08-06 VITALS — BP 153/92 | HR 77 | Ht 68.0 in | Wt 200.0 lb

## 2015-08-06 DIAGNOSIS — Z01818 Encounter for other preprocedural examination: Secondary | ICD-10-CM | POA: Diagnosis not present

## 2015-08-06 DIAGNOSIS — I10 Essential (primary) hypertension: Secondary | ICD-10-CM | POA: Diagnosis not present

## 2015-08-06 DIAGNOSIS — F17200 Nicotine dependence, unspecified, uncomplicated: Secondary | ICD-10-CM

## 2015-08-06 DIAGNOSIS — R062 Wheezing: Secondary | ICD-10-CM

## 2015-08-06 NOTE — Patient Instructions (Signed)
Your physician wants you to follow-up in: 6 months with Dr. Johnsie Cancel. You will receive a reminder letter in the mail two months in advance. If you don't receive a letter, please call our office to schedule the follow-up appointment.  Your physician recommends that you continue on your current medications as directed. Please refer to the Current Medication list given to you today.  Your physician has requested that you have en exercise stress myoview. For further information please visit [email or web address]. Please follow instruction sheet, as given.  If you need a refill on your cardiac medications before your next appointment, please call your pharmacy.  Thank you for choosing [facility]!

## 2015-08-06 NOTE — Progress Notes (Signed)
Cardiology Office Note   Date:  08/06/2015   ID:  GAL LEFTON, DOB 10/27/1950, MRN [id number]  PCP:  Asencion Noble, MD  Cardiologist:   Jenkins Rouge, MD   No chief complaint on file.     History of Present Illness: Jeremy Mclean is a 65 y.o. 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 No chest pain Reviewed CT done this week And AAA only 3.7 cm and RCIA only 2.7 cm.  Fairly stable since CT 2014  No chest pain. Still smoking ppd. Wheezy since he had contrast /  CT has rxn to it was premedicated Does not like steroids gets him all agitated  Past Medical History  Diagnosis Date  . Hypertension   . Diverticulitis   . Kidney stone   . Depression   . Anxiety   . GERD (gastroesophageal reflux disease)   . Arthritis     Past Surgical History  Procedure Laterality Date  . Kidney stent    . Back surgery      cervical fusion  . Cystoscopy      with stent  . Colonoscopy N/A 04/09/2014    Procedure: COLONOSCOPY;  Surgeon: Rogene Houston, MD;  [facility];  Service: Endoscopy;  Laterality: N/A;  730 - moved to 7:30 - Ann to notify pt     Current Outpatient Prescriptions  Medication Sig Dispense Refill  . amLODipine (NORVASC) 10 MG tablet Take 10 mg by mouth daily.    . chlorthalidone (HYGROTON) 25 MG tablet Reported on 07/31/2015    . diazepam (VALIUM) 5 MG tablet Take 5 mg by mouth daily. Anxiety    . diphenhydrAMINE (BENADRYL) 50 MG capsule Take 50 mg by mouth 1 hour prior to your procedure @ 11:45 AM 6/14. 1 capsule 0  . lisinopril (PRINIVIL,ZESTRIL) 40 MG tablet Take 40 mg by mouth daily.    Marland Kitchen omeprazole (PRILOSEC) 20 MG capsule Take 20 mg by mouth daily.    .  predniSONE (DELTASONE) 50 MG tablet One tablet (50mg ) 13 hours prior to procedure (@ 11:45 PM 6/13), one tablet (50mg ) 7 hours prior to procedure (@ 5:45 AM 6/14), and then one tablet (50 mg) one hour prior to procedure (@ 11:45 AM 6/14) . 3 tablet 0  . traMADol (ULTRAM) 50 MG tablet Take by mouth every 6 (six) hours as needed.     No current facility-administered medications for this visit.   Facility-Administered Medications Ordered in Other Visits  Medication Dose Route Frequency Provider Last Rate Last Dose  . rabies immune globulin (HYPERAB) injection 1,720 Units  1,720 Units Intramuscular Once Duaine Dredge, PA-C      . rabies vaccine (RABAVERT/IMOVAX) injection 1 mL  1 mL Intramuscular Once Duaine Dredge, PA-C        Allergies:   Iohexol    Social History:  The patient  reports that he has been smoking Cigarettes.  He has a 40 pack-year smoking history. He has never used smokeless tobacco. He reports that he does not drink alcohol or use illicit drugs.   Family History:  The patient's  family history includes Cancer in his father and other; Diabetes in his mother and other.    ROS:  Please see the history of present illness.   Otherwise, review of systems are positive for none.   All other systems are reviewed and negative.    PHYSICAL EXAM: VS:  There were no vitals taken for this visit. , BMI There is no weight on file to calculate BMI. Affect appropriate Bronchitic male  HEENT: normal Neck supple with no adenopathy JVP normal no bruits no thyromegaly Lungs EX wheezing and good diaphragmatic motion Heart:  S1/S2 no murmur, no rub, gallop or click PMI normal Abdomen: benighn, BS positve, no tenderness, no AAA no bruit.  No HSM or HJR Distal pulses intact with no bruits No edema Neuro non-focal Skin warm and dry No muscular weakness    EKG:  08/06/15 SR rate 72 nonspecific ST changes    Recent Labs: 08/03/2015: Creat 1.01    Lipid Panel No results found  for: CHOL, TRIG, HDL, CHOLHDL, VLDL, LDLCALC, LDLDIRECT    Wt Readings from Last 3 Encounters:  07/31/15 201 lb (91.173 kg)  07/11/14 202 lb (91.627 kg)  04/09/14 198 lb (89.812 kg)      Other studies Reviewed: Additional studies/ records that were reviewed today include: CTA [place] Dr Bridgett Larsson notes and duplex.    ASSESSMENT AND PLAN:  1. Preop:  Smoker with abnormal ECG. Active with no chest pain f/u exercise myovue 2. COPD:  Discussed directly with Dr Willey Blade needs preop PFT;s and referral to Dr Luan Pulling Would benefit from inhalers Discussed smoking cessation and relation to vascular Dx discussed 3. PVD:  F/u Dr Bridgett Larsson clearly dose not need surgery for AAA not clear if EVAR needed for iliac Has f/u with Dr Bridgett Larsson latter this month .   4. HTN:  Well controlled.  Continue current medications and low sodium Dash type diet.      Current medicines are reviewed at length with the patient today.  The patient does not have concerns regarding medicines.  The following changes have been made:  no change  Labs/ tests ordered today include: EX Myovue.  Refer pulmonary PFT;s   No orders of the defined types were placed in this encounter.     Disposition:   FU with me in 6 months      Signed, Jenkins Rouge, MD  08/06/2015 7:44 AM    [facility] [address], [place], [place]  [address] Phone: [phone]; Fax: [phone]

## 2015-08-14 ENCOUNTER — Encounter: Payer: Self-pay | Admitting: Vascular Surgery

## 2015-08-16 ENCOUNTER — Emergency Department (HOSPITAL_COMMUNITY): Payer: Medicare Other

## 2015-08-16 ENCOUNTER — Emergency Department (HOSPITAL_COMMUNITY)
Admission: EM | Admit: 2015-08-16 | Discharge: 2015-08-16 | Disposition: A | Discharge status: Home / Self Care | Payer: Medicare Other | Attending: Emergency Medicine | Admitting: Emergency Medicine

## 2015-08-16 ENCOUNTER — Encounter (HOSPITAL_COMMUNITY): Payer: Self-pay | Admitting: *Deleted

## 2015-08-16 DIAGNOSIS — N23 Unspecified renal colic: Secondary | ICD-10-CM | POA: Diagnosis not present

## 2015-08-16 DIAGNOSIS — N368 Other specified disorders of urethra: Secondary | ICD-10-CM | POA: Diagnosis not present

## 2015-08-16 DIAGNOSIS — Z79899 Other long term (current) drug therapy: Secondary | ICD-10-CM | POA: Insufficient documentation

## 2015-08-16 DIAGNOSIS — I714 Abdominal aortic aneurysm, without rupture, unspecified: Secondary | ICD-10-CM

## 2015-08-16 DIAGNOSIS — N201 Calculus of ureter: Secondary | ICD-10-CM | POA: Diagnosis not present

## 2015-08-16 DIAGNOSIS — I10 Essential (primary) hypertension: Secondary | ICD-10-CM | POA: Diagnosis not present

## 2015-08-16 DIAGNOSIS — R1030 Lower abdominal pain, unspecified: Secondary | ICD-10-CM | POA: Diagnosis present

## 2015-08-16 DIAGNOSIS — F17210 Nicotine dependence, cigarettes, uncomplicated: Secondary | ICD-10-CM | POA: Insufficient documentation

## 2015-08-16 HISTORY — DX: Abdominal aortic aneurysm, without rupture, unspecified: I71.40

## 2015-08-16 HISTORY — DX: Abdominal aortic aneurysm, without rupture: I71.4

## 2015-08-16 LAB — COMPREHENSIVE METABOLIC PANEL
ALBUMIN: 3.7 g/dL (ref 3.5–5.0)
ALT: 19 U/L (ref 17–63)
AST: 22 U/L (ref 15–41)
Alkaline Phosphatase: 51 U/L (ref 38–126)
Anion gap: 4 — ABNORMAL LOW (ref 5–15)
BILIRUBIN TOTAL: 0.4 mg/dL (ref 0.3–1.2)
BUN: 10 mg/dL (ref 6–20)
CHLORIDE: 111 mmol/L (ref 101–111)
CO2: 26 mmol/L (ref 22–32)
Calcium: 9.4 mg/dL (ref 8.9–10.3)
Creatinine, Ser: 1.11 mg/dL (ref 0.61–1.24)
GFR calc Af Amer: >60 mL/min (ref >60)
Glucose, Bld: 108 mg/dL — ABNORMAL HIGH (ref 65–99)
POTASSIUM: 4.5 mmol/L (ref 3.5–5.1)
Sodium: 141 mmol/L (ref 135–145)
TOTAL PROTEIN: 7.2 g/dL (ref 6.5–8.1)

## 2015-08-16 LAB — URINALYSIS, ROUTINE W REFLEX MICROSCOPIC
Bilirubin Urine: NEGATIVE
GLUCOSE, UA: NEGATIVE mg/dL
Ketones, ur: 15 mg/dL — AB
LEUKOCYTES UA: NEGATIVE
NITRITE: NEGATIVE
Protein, ur: 100 mg/dL — AB
SPECIFIC GRAVITY, URINE: 1.023 (ref 1.005–1.030)
pH: 6.5 (ref 5.0–8.0)

## 2015-08-16 LAB — CBC
HEMATOCRIT: 44.4 % (ref 39.0–52.0)
HEMOGLOBIN: 15 g/dL (ref 13.0–17.0)
MCH: 31.8 pg (ref 26.0–34.0)
MCHC: 33.8 g/dL (ref 30.0–36.0)
MCV: 94.3 fL (ref 78.0–100.0)
Platelets: 304 10*3/uL (ref 150–400)
RBC: 4.71 MIL/uL (ref 4.22–5.81)
RDW: 13.9 % (ref 11.5–15.5)
WBC: 10.2 10*3/uL (ref 4.0–10.5)

## 2015-08-16 LAB — I-STAT CG4 LACTIC ACID, ED
LACTIC ACID, VENOUS: 2.05 mmol/L — AB (ref 0.5–2.0)
Lactic Acid, Venous: 1.15 mmol/L (ref 0.5–2.0)
Lactic Acid, Venous: 1.6 mmol/L (ref 0.5–2.0)

## 2015-08-16 LAB — LIPASE, BLOOD: Lipase: 21 U/L (ref 11–51)

## 2015-08-16 LAB — URINE MICROSCOPIC-ADD ON
Bacteria, UA: NONE SEEN
SQUAMOUS EPITHELIAL / LPF: NONE SEEN

## 2015-08-16 MED ORDER — DIPHENHYDRAMINE HCL 50 MG/ML IJ SOLN
50.0000 mg | Freq: Once | INTRAMUSCULAR | Status: AC
Start: 2015-08-16 — End: 2015-08-16
  Administered 2015-08-16: 50 mg via INTRAVENOUS
  Filled 2015-08-16: qty 1

## 2015-08-16 MED ORDER — HYDROMORPHONE HCL 1 MG/ML IJ SOLN: 1.0000 mg | Freq: Once | INTRAMUSCULAR | Status: DC

## 2015-08-16 MED ORDER — TAMSULOSIN HCL 0.4 MG PO CAPS: 0.4000 mg | ORAL_CAPSULE | Freq: Every day | ORAL | Status: DC

## 2015-08-16 MED ORDER — IBUPROFEN 400 MG PO TABS: 400.0000 mg | ORAL_TABLET | Freq: Three times a day (TID) | ORAL | Status: DC

## 2015-08-16 MED ORDER — SODIUM CHLORIDE 0.9 % IV BOLUS (SEPSIS)
1000.0000 mL | Freq: Once | INTRAVENOUS | Status: AC
  Administered 2015-08-16: 1000 mL via INTRAVENOUS

## 2015-08-16 MED ORDER — HYDROCORTISONE NA SUCCINATE PF 250 MG IJ SOLR
200.0000 mg | Freq: Once | INTRAMUSCULAR | Status: DC
  Filled 2015-08-16: qty 200

## 2015-08-16 MED ORDER — IOPAMIDOL (ISOVUE-370) INJECTION 76%
INTRAVENOUS | Status: AC
Start: 2015-08-16 — End: 2015-08-16
  Administered 2015-08-16: 100 mL
  Filled 2015-08-16: qty 100

## 2015-08-16 MED ORDER — KETOROLAC TROMETHAMINE 30 MG/ML IJ SOLN
15.0000 mg | Freq: Once | INTRAMUSCULAR | Status: AC
  Administered 2015-08-16: 15 mg via INTRAVENOUS
  Filled 2015-08-16: qty 1

## 2015-08-16 MED ORDER — HYDROMORPHONE HCL 1 MG/ML IJ SOLN
1.0000 mg | Freq: Once | INTRAMUSCULAR | Status: AC
  Administered 2015-08-16: 1 mg via INTRAVENOUS
  Filled 2015-08-16: qty 1

## 2015-08-16 MED ORDER — HYDROCORTISONE NA SUCCINATE PF 100 MG IJ SOLR
200.0000 mg | Freq: Once | INTRAMUSCULAR | Status: AC
  Administered 2015-08-16: 200 mg via INTRAVENOUS

## 2015-08-16 MED ORDER — ONDANSETRON HCL 4 MG PO TABS: 4.0000 mg | ORAL_TABLET | Freq: Four times a day (QID) | ORAL | Status: DC

## 2015-08-16 MED ORDER — DIPHENHYDRAMINE HCL 50 MG/ML IJ SOLN: 50.0000 mg | Freq: Once | INTRAMUSCULAR | Status: DC

## 2015-08-16 MED ORDER — OXYCODONE-ACETAMINOPHEN 5-325 MG PO TABS: 1.0000 | ORAL_TABLET | ORAL | Status: DC | PRN

## 2015-08-16 MED ORDER — ONDANSETRON HCL 4 MG/2ML IJ SOLN
4.0000 mg | Freq: Once | INTRAMUSCULAR | Status: AC
  Administered 2015-08-16: 4 mg via INTRAVENOUS
  Filled 2015-08-16: qty 2

## 2015-08-16 NOTE — Progress Notes (Signed)
   Daily Progress Note  I reviewed the CTA abd/pelvis.  This patient does not have a ruptured AAA.  I don't see any worrisome findings for imminent rupture.  - pt already has follow up with me concerning his small AAA and B CIA aneurysm  Adele Barthel, MD [facility]: [phone] Pager: [phone]  08/16/2015, 8:10 PM

## 2015-08-16 NOTE — Discharge Instructions (Signed)
Kidney Stones Your aneurysm looks stable. Followup with the urologist for your kidney stone. Return to the ED if you develop fever, abdominal pain, vomiting, or any other concerns. Kidney stones (urolithiasis) are deposits that form inside your kidneys. The intense pain is caused by the stone moving through the urinary tract. When the stone moves, the ureter goes into spasm around the stone. The stone is usually passed in the urine.  CAUSES   A disorder that makes certain neck glands produce too much parathyroid hormone (primary hyperparathyroidism).  A buildup of uric acid crystals, similar to gout in your joints.  Narrowing (stricture) of the ureter.  A kidney obstruction present at birth (congenital obstruction).  Previous surgery on the kidney or ureters.  Numerous kidney infections. SYMPTOMS   Feeling sick to your stomach (nauseous).  Throwing up (vomiting).  Blood in the urine (hematuria).  Pain that usually spreads (radiates) to the groin.  Frequency or urgency of urination. DIAGNOSIS   Taking a history and physical exam.  Blood or urine tests.  CT scan.  Occasionally, an examination of the inside of the urinary bladder (cystoscopy) is performed. TREATMENT   Observation.  Increasing your fluid intake.  Extracorporeal shock wave lithotripsy--This is a noninvasive procedure that uses shock waves to break up kidney stones.  Surgery may be needed if you have severe pain or persistent obstruction. There are various surgical procedures. Most of the procedures are performed with the use of small instruments. Only small incisions are needed to accommodate these instruments, so recovery time is minimized. The size, location, and chemical composition are all important variables that will determine the proper choice of action for you. Talk to your health care provider to better understand your situation so that you will minimize the risk of injury to yourself and your kidney.   HOME CARE INSTRUCTIONS   Drink enough water and fluids to keep your urine clear or pale yellow. This will help you to pass the stone or stone fragments.  Strain all urine through the provided strainer. Keep all particulate matter and stones for your health care provider to see. The stone causing the pain may be as small as a grain of salt. It is very important to use the strainer each and every time you pass your urine. The collection of your stone will allow your health care provider to analyze it and verify that a stone has actually passed. The stone analysis will often identify what you can do to reduce the incidence of recurrences.  Only take over-the-counter or prescription medicines for pain, discomfort, or fever as directed by your health care provider.  Keep all follow-up visits as told by your health care provider. This is important.  Get follow-up X-rays if required. The absence of pain does not always mean that the stone has passed. It may have only stopped moving. If the urine remains completely obstructed, it can cause loss of kidney function or even complete destruction of the kidney. It is your responsibility to make sure X-rays and follow-ups are completed. Ultrasounds of the kidney can show blockages and the status of the kidney. Ultrasounds are not associated with any radiation and can be performed easily in a matter of minutes.  Make changes to your daily diet as told by your health care provider. You may be told to:  Limit the amount of salt that you eat.  Eat 5 or more servings of fruits and vegetables each day.  Limit the amount of meat, poultry, fish,  and eggs that you eat.  Collect a 24-hour urine sample as told by your health care provider.You may need to collect another urine sample every 6-12 months. SEEK MEDICAL CARE IF:  You experience pain that is progressive and unresponsive to any pain medicine you have been prescribed. SEEK IMMEDIATE MEDICAL CARE IF:    Pain cannot be controlled with the prescribed medicine.  You have a fever or shaking chills.  The severity or intensity of pain increases over 18 hours and is not relieved by pain medicine.  You develop a new onset of abdominal pain.  You feel faint or pass out.  You are unable to urinate.   This information is not intended to replace advice given to you by your health care provider. Make sure you discuss any questions you have with your health care provider.   Document Released: 02/07/2005 Document Revised: 10/29/2014 Document Reviewed: 07/11/2012 Elsevier Interactive Patient Education [employer].

## 2015-08-16 NOTE — ED Notes (Signed)
Patient transported to CT 

## 2015-08-16 NOTE — ED Notes (Signed)
Dr. Wyvonnia Dusky in to speak with patient about plan of care and need for CTA

## 2015-08-16 NOTE — ED Notes (Signed)
Pt reports onset of severe lower abd pain around 1300. Has hx of AAA. Reports pain is severe, having nausea. Denies any vomiting, diarrhea or urinary symptoms.

## 2015-08-16 NOTE — ED Notes (Signed)
Patient given coke for po fluid challenge.

## 2015-08-16 NOTE — ED Provider Notes (Signed)
CSN: [id number]     Arrival date & time 08/16/15  1409 History   First MD Initiated Contact with Patient 08/16/15 1459     Chief Complaint  Patient presents with  . Abdominal Pain     (Consider location/radiation/quality/duration/timing/severity/associated sxs/prior Treatment) HPI Comments: Patient home with acute onset of lower abdominal pain that started around 1 PM. States pain is constant, doesn't move, nothing makes it better or worse. This nausea but no vomiting,no diarrhea. No fever. No chest pain or shortness of breath. No dysuria hematuria. No testicular pain. Patient with known aortic aneurysm last measured by CT scan on June 15 at 3.7 cm. No surgery plan at this time. Patient does have IV contrast allergy. States he also has history of kidney stones and diverticulitis. He feels this could be a kidney stone.  Patient is a 65 y.o. male presenting with abdominal pain. The history is provided by the patient and a relative. The history is limited by the condition of the patient.  Abdominal Pain Associated symptoms: nausea   Associated symptoms: no chest pain, no cough, no diarrhea, no dysuria, no fatigue, no fever, no shortness of breath and no vomiting     Past Medical History  Diagnosis Date  . Hypertension   . Diverticulitis   . Kidney stone   . Depression   . Anxiety   . GERD (gastroesophageal reflux disease)   . Arthritis   . AAA (abdominal aortic aneurysm) [facility])    Past Surgical History  Procedure Laterality Date  . Kidney stent    . Back surgery      cervical fusion  . Cystoscopy      with stent  . Colonoscopy N/A 04/09/2014    Procedure: COLONOSCOPY;  Surgeon: Rogene Houston, MD;  Location: [facility];  Service: Endoscopy;  Laterality: N/A;  730 - moved to 7:30 - Ann to notify pt   Family History  Problem Relation Age of Onset  . Cancer Other   . Diabetes Other   . Diabetes Mother   . Cancer Father    Social History  Substance Use Topics  . Smoking  status: Current Every Day Smoker -- 1.00 packs/day for 40 years    Types: Cigarettes  . Smokeless tobacco: Never Used  . Alcohol Use: No    Review of Systems  Constitutional: Positive for activity change and appetite change. Negative for fever and fatigue.  Respiratory: Negative for cough, chest tightness and shortness of breath.   Cardiovascular: Negative for chest pain.  Gastrointestinal: Positive for nausea and abdominal pain. Negative for vomiting and diarrhea.  Genitourinary: Negative for dysuria.  Musculoskeletal: Negative for myalgias, back pain, arthralgias and gait problem.  Skin: Negative for rash.  Neurological: Negative for dizziness, weakness and headaches.  A complete 10 system review of systems was obtained and all systems are negative except as noted in the HPI and PMH.      Allergies  Contrast media; Iohexol; and Prednisone  Home Medications   Prior to Admission medications   Medication Sig Start Date End Date Taking? Authorizing Provider  amLODipine (NORVASC) 10 MG tablet Take 10 mg by mouth daily.   Yes Historical Provider, MD  chlorthalidone (HYGROTON) 25 MG tablet Take 25 mg by mouth daily. Reported on 07/31/2015 06/12/14  Yes Historical Provider, MD  diazepam (VALIUM) 5 MG tablet Take 5 mg by mouth at bedtime. Anxiety   Yes Historical Provider, MD  lisinopril (PRINIVIL,ZESTRIL) 40 MG tablet Take 40 mg by mouth  daily.   Yes Historical Provider, MD  omeprazole (PRILOSEC) 20 MG capsule Take 20 mg by mouth daily.   Yes Historical Provider, MD  traMADol (ULTRAM) 50 MG tablet Take 50 mg by mouth every 6 (six) hours as needed for moderate pain.    Yes Historical Provider, MD   BP 133/75 mmHg  Pulse 65  Temp(Src) 98.3 F (36.8 C) (Oral)  Resp 18  SpO2 95% Physical Exam  Constitutional: He is oriented to person, place, and time. He appears well-developed and well-nourished. No distress.  HENT:  Head: Normocephalic and atraumatic.  Mouth/Throat: Oropharynx is  clear and moist. No oropharyngeal exudate.  Eyes: Conjunctivae and EOM are normal. Pupils are equal, round, and reactive to light.  Neck: Normal range of motion. Neck supple.  No meningismus.  Cardiovascular: Normal rate, regular rhythm, normal heart sounds and intact distal pulses.   No murmur heard. Pulmonary/Chest: Effort normal and breath sounds normal. No respiratory distress.  Abdominal: Soft. There is tenderness. There is no rebound and no guarding.  TTP RLQ and periumbilical pain. Equal femoral and DP pulses  Musculoskeletal: Normal range of motion. He exhibits no edema or tenderness.  No CVAT  Neurological: He is alert and oriented to person, place, and time. No cranial nerve deficit. He exhibits normal muscle tone. Coordination normal.  No ataxia on finger to nose bilaterally. No pronator drift. 5/5 strength throughout. CN 2-12 intact.Equal grip strength. Sensation intact.   Skin: Skin is warm.  Psychiatric: He has a normal mood and affect. His behavior is normal.  Nursing note and vitals reviewed.   ED Course  Procedures (including critical care time) Labs Review Labs Reviewed  COMPREHENSIVE METABOLIC PANEL - Abnormal; Notable for the following:    Glucose, Bld 108 (*)    Anion gap 4 (*)    All other components within normal limits  URINALYSIS, ROUTINE W REFLEX MICROSCOPIC (NOT AT [facility]) - Abnormal; Notable for the following:    Color, Urine AMBER (*)    APPearance CLOUDY (*)    Hgb urine dipstick LARGE (*)    Ketones, ur 15 (*)    Protein, ur 100 (*)    All other components within normal limits  I-STAT CG4 LACTIC ACID, ED - Abnormal; Notable for the following:    Lactic Acid, Venous 2.05 (*)    All other components within normal limits  LIPASE, BLOOD  CBC  URINE MICROSCOPIC-ADD ON  I-STAT CG4 LACTIC ACID, ED  I-STAT CG4 LACTIC ACID, ED  I-STAT CG4 LACTIC ACID, ED    Imaging Review Ct Abdomen Pelvis Wo Contrast  08/16/2015  CLINICAL DATA:  Severe lower  abdominal pain EXAM: CT ABDOMEN AND PELVIS WITHOUT CONTRAST TECHNIQUE: Multidetector CT imaging of the abdomen and pelvis was performed following the standard protocol without IV contrast. COMPARISON:  CT 08/05/2015 FINDINGS: Lower chest: Lung bases are clear. Hepatobiliary: No focal hepatic lesion. No biliary duct dilatation. Gallbladder is normal. Common bile duct is normal. Pancreas: Pancreas is normal. No ductal dilatation. No pancreatic inflammation. Spleen: Normal spleen Adrenals/urinary tract: Bilateral adrenal enlargement with low attenuation consistent benign adrenal adenomas. There are six 2 mm calculi within the RIGHT kidney. There is a calculus within the proximal RIGHT ureter measuring 4 mm (image 51, series 3). This proximal RIGHT ureteral calculus is several cm distal to the RIGHT ureteropelvic junction and located at the L3-L4 vertebral body level. There is mild hydroureter and pelvicaliectasis proximal to this partially obstructing calculus. No more distal ureteral calculus on the  RIGHT. No bladder calculi. There are 2 calculi within the LEFT kidney measuring 1 and 2 mm. No LEFT ureteral calculi. Low-density cystic lesion extending from the lateral aspect of the LEFT renal cortex. Stomach/Bowel: Stomach, small bowel, appendix, and cecum are normal. Several diverticula of the descending and sigmoid colon without acute inflammation. Vascular/Lymphatic: Intimal calcification of the abdominal aorta. Abdominal aortic aneurysm described in detail on CT 08/05/2015 no interval change. Reproductive: Prostate normal. Other: No free fluid. Musculoskeletal: No aggressive osseous lesion. IMPRESSION: 1. RIGHT ureteral calculus with mild obstruction. 2. Bilateral nephrolithiasis. 3. Infrarenal abdominal aortic aneurysm described on comparison CT 08/05/2015. Electronically Signed   By: Suzy Bouchard M.D.   On: 08/16/2015 16:07   US Aorta  08/16/2015  CLINICAL DATA:  Severe lower abdominal pain beginning today  at 1 p.m. Nausea. History of abdominal aortic aneurysm. EXAM: ULTRASOUND OF ABDOMINAL AORTA TECHNIQUE: Ultrasound examination of the abdominal aorta was performed to evaluate for abdominal aortic aneurysm. COMPARISON:  Multiple prior CT scans.  The most recent is 08/16/2015 FINDINGS: Abdominal Aorta Proximal:  2.9 x 2.7 cm Mid:  3.3 x 3.3 cm Distal:  3.8 x 3.6 cm Right Common iliac artery:  1.8 x 2.0 cm Left Common iliac artery:  1.9 x 1.8 cm IMPRESSION: Infrarenal abdominal aortic aneurysm unchanged since recent prior CT scans. Periaortic fluid collection. Electronically Signed   By: Marijo Sanes M.D.   On: 08/16/2015 18:10   Ct Cta Abd/pel W/cm &/or W/o Cm  08/16/2015  CLINICAL DATA:  Severe abdominal pain. RIGHT ureteral calculus. History of aortic aneurysm. Evaluate aorta with IV contrast. EXAM: CTA ABDOMEN AND PELVIS wITHOUT AND WITH CONTRAST TECHNIQUE: Multidetector CT imaging of the abdomen and pelvis was performed using the standard protocol during bolus administration of intravenous contrast. Multiplanar reconstructed images and MIPs were obtained and reviewed to evaluate the vascular anatomy. CONTRAST:  100 mL Isovue 370 COMPARISON:  CT without contrast same day, CT with contrast 08/05/2015 FINDINGS: Lower chest: Lung bases are clear. Hepatobiliary: No focal hepatic lesion. No biliary duct dilatation. Gallbladder is normal. Common bile duct is normal. Pancreas: Pancreas is normal. No ductal dilatation. No pancreatic inflammation. Spleen: Normal spleen Adrenals/urinary tract: Bilateral adrenal adenomas. Obstructing calculus within the mid RIGHT ureter described on CT exam same day. There is renal edema which is increased slightly in the interval from scan earlier same day nonenhancing cyst of the LEFT ovary. Stomach/Bowel: Stomach, small bowel, appendix, and cecum are normal. The colon and rectosigmoid colon are normal. Vascular/Lymphatic: Again demonstrated infrarenal abdominal aorta. The infrarenal  aorta is ectatic. There is no change in volume compared to prior CT with the aneurysm measuring 37 mm (image 67, series 4) compared to 37 on prior exam same level (measured in same orientation). There is no periaortic inflammation suggest impending rupture or dissection. No dissection flap. Mild aneurysmal dilatation of the iliac arteries is unchanged. The superior mesenteric artery and celiac trunk are patent. Bilateral single renal arteries fill completely. Reproductive: Prostate normal Other: No free fluid. Musculoskeletal: No aggressive osseous lesion. Review of the MIP images confirms the above findings. IMPRESSION: 1. Obstructing RIGHT ureteral calculus as described on noncontrast CT exam same day. Increase in perinephric fluid on the RIGHT compared to exam earlier same day consistent with progressive obstruction. 2. No change in infrarenal abdominal aortic aneurysm compared to CT of 08/05/2015. Electronically Signed   By: Suzy Bouchard M.D.   On: 08/16/2015 20:21   I have personally reviewed and evaluated these images and  lab results as part of my medical decision-making.   EKG Interpretation None      MDM   Final diagnoses:  Ureteral colic  Abdominal aortic aneurysm (AAA) without rupture (HCC)   Known AAA with acute onset of lower abdominal pain around 1:00. Also with history of diverticulitis and kidney stones.  D/w Radiology Dr. Polly Cobia.  Unable to wait 4 hours for contrast allergy prophylaxis.  Patient with recent CTA and maximal aneurysm 3.7 cm. Will proceed with noncontrast CT for now.  Obtained and shows stable aortic aneurysm without evidence of complication. There are multiple kidney stones including a renal stone on the right which is likely source of patient's pain.  This proximal RIGHT ureteral calculus is several cm distal to the RIGHT ureteropelvic junction and located at the L3-L4 vertebral body level. There is mild hydroureter and pelvicaliectasis proximal to this  partially obstructing calculus.   UA shows hematuria. No infection. Pain improved with treatment in the ED.  Periaortic fluid collection seen on [place].  Not seen on CT.  D/w Dr. Bridgett Larsson. He states needs CTA as only was to rule out rupture. Will pretreat and obtain CTA.  CTA shows no complication of patient's known AAA. No change from 6/14. No periaortic inflammation seen.- Patient's pain is improved. He tolerated the contrast well without adverse effect. He wishes to go home. Lactate has cleared.  We'll treat for kidney stone, follow-up with urology. Return precautions discussed. Including pain, vomiting, fever or any other concerns.   EMERGENCY DEPARTMENT [place] ABD/AORTA EXAM Study: Limited Ultrasound of the Abdominal Aorta.  INDICATIONS:Abdominal pain and Age>55 Indication: Multiple views of the abdominal aorta are obtained from the diaphragmatic hiatus to the aortic bifurcation in transverse and sagittal planes with a multi- Frequency probe.  PERFORMED BY: Myself  IMAGES ARCHIVED?: Yes  FINDINGS: Free fluid absent  LIMITATIONS:  Body habitus and Abdominal pain  INTERPRETATION:  Abdominal free fluid absent  COMMENT:  Aorta not well seen, no free fluid, recent CT scan on June 15 showed maximum diameter 3.7 cm   Ezequiel Essex, MD 08/17/15 0010

## 2015-08-18 ENCOUNTER — Emergency Department (HOSPITAL_COMMUNITY)
Admission: EM | Admit: 2015-08-18 | Discharge: 2015-08-18 | Disposition: A | Discharge status: Home / Self Care | Payer: Medicare Other | Attending: Emergency Medicine | Admitting: Emergency Medicine

## 2015-08-18 ENCOUNTER — Encounter (HOSPITAL_COMMUNITY): Payer: Medicare Other

## 2015-08-18 ENCOUNTER — Emergency Department (HOSPITAL_COMMUNITY): Payer: Medicare Other

## 2015-08-18 ENCOUNTER — Encounter (HOSPITAL_COMMUNITY): Payer: Self-pay | Admitting: Emergency Medicine

## 2015-08-18 ENCOUNTER — Inpatient Hospital Stay (HOSPITAL_COMMUNITY): Admission: RE | Admit: 2015-08-18 | Payer: Medicare Other | Source: Ambulatory Visit

## 2015-08-18 DIAGNOSIS — R930 Abnormal findings on diagnostic imaging of skull and head, not elsewhere classified: Secondary | ICD-10-CM | POA: Insufficient documentation

## 2015-08-18 DIAGNOSIS — F17210 Nicotine dependence, cigarettes, uncomplicated: Secondary | ICD-10-CM | POA: Diagnosis not present

## 2015-08-18 DIAGNOSIS — N201 Calculus of ureter: Secondary | ICD-10-CM | POA: Diagnosis not present

## 2015-08-18 DIAGNOSIS — Z79899 Other long term (current) drug therapy: Secondary | ICD-10-CM | POA: Diagnosis not present

## 2015-08-18 DIAGNOSIS — R531 Weakness: Secondary | ICD-10-CM | POA: Diagnosis present

## 2015-08-18 DIAGNOSIS — I10 Essential (primary) hypertension: Secondary | ICD-10-CM | POA: Diagnosis not present

## 2015-08-18 DIAGNOSIS — M5412 Radiculopathy, cervical region: Secondary | ICD-10-CM | POA: Diagnosis not present

## 2015-08-18 DIAGNOSIS — M1990 Unspecified osteoarthritis, unspecified site: Secondary | ICD-10-CM | POA: Insufficient documentation

## 2015-08-18 DIAGNOSIS — F329 Major depressive disorder, single episode, unspecified: Secondary | ICD-10-CM | POA: Diagnosis not present

## 2015-08-18 DIAGNOSIS — I714 Abdominal aortic aneurysm, without rupture: Secondary | ICD-10-CM | POA: Diagnosis not present

## 2015-08-18 DIAGNOSIS — M50121 Cervical disc disorder at C4-C5 level with radiculopathy: Secondary | ICD-10-CM | POA: Insufficient documentation

## 2015-08-18 LAB — CBC WITH DIFFERENTIAL/PLATELET
BASOS ABS: 0 10*3/uL (ref 0.0–0.1)
Basophils Relative: 0 %
EOS ABS: 0.2 10*3/uL (ref 0.0–0.7)
Eosinophils Relative: 1 %
HCT: 40.9 % (ref 39.0–52.0)
Hemoglobin: 14.2 g/dL (ref 13.0–17.0)
LYMPHS ABS: 2.3 10*3/uL (ref 0.7–4.0)
Lymphocytes Relative: 12 %
MCH: 32.7 pg (ref 26.0–34.0)
MCHC: 34.7 g/dL (ref 30.0–36.0)
MCV: 94.2 fL (ref 78.0–100.0)
MONO ABS: 2.3 10*3/uL — AB (ref 0.1–1.0)
Monocytes Relative: 12 %
NEUTROS PCT: 75 %
Neutro Abs: 14.1 10*3/uL — ABNORMAL HIGH (ref 1.7–7.7)
PLATELETS: 235 10*3/uL (ref 150–400)
RBC: 4.34 MIL/uL (ref 4.22–5.81)
RDW: 14.2 % (ref 11.5–15.5)
WBC: 18.9 10*3/uL — AB (ref 4.0–10.5)

## 2015-08-18 LAB — I-STAT CHEM 8, ED
BUN: 16 mg/dL (ref 6–20)
Calcium, Ion: 1.18 mmol/L (ref 1.12–1.23)
Chloride: 104 mmol/L (ref 101–111)
Creatinine, Ser: 1.6 mg/dL — ABNORMAL HIGH (ref 0.61–1.24)
GLUCOSE: 102 mg/dL — AB (ref 65–99)
HEMATOCRIT: 44 % (ref 39.0–52.0)
HEMOGLOBIN: 15 g/dL (ref 13.0–17.0)
Potassium: 3.9 mmol/L (ref 3.5–5.1)
SODIUM: 140 mmol/L (ref 135–145)
TCO2: 23 mmol/L (ref 0–100)

## 2015-08-18 MED ORDER — METHYLPREDNISOLONE 4 MG PO TBPK: ORAL_TABLET | ORAL | Status: DC

## 2015-08-18 NOTE — ED Notes (Signed)
Pt stated this morning he woke up feeling weak around 0800.  Pt had a doctors appointment at the nephrologist office at 1030 and was instructed to come to the ED.

## 2015-08-18 NOTE — ED Notes (Signed)
Pt reports left side of neck pain. No recent injuries. Pt has been building a rock house.

## 2015-08-18 NOTE — Discharge Instructions (Signed)

## 2015-08-18 NOTE — ED Notes (Signed)
MD at bedside. 

## 2015-08-18 NOTE — ED Notes (Signed)
Patient transported to CT 
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
## 2015-08-18 NOTE — ED Notes (Signed)
Foam sling placed on L arm, pt states this feels much more comfortable and stable. Denies pain, pt very unsteady when moving to [facility] for transport.

## 2015-08-19 ENCOUNTER — Encounter (HOSPITAL_COMMUNITY): Payer: Medicare Other

## 2015-08-19 DIAGNOSIS — M5412 Radiculopathy, cervical region: Secondary | ICD-10-CM | POA: Insufficient documentation

## 2015-08-19 DIAGNOSIS — M50222 Other cervical disc displacement at C5-C6 level: Secondary | ICD-10-CM | POA: Diagnosis not present

## 2015-08-19 DIAGNOSIS — M542 Cervicalgia: Secondary | ICD-10-CM | POA: Insufficient documentation

## 2015-08-19 DIAGNOSIS — Z6836 Body mass index (BMI) 36.0-36.9, adult: Secondary | ICD-10-CM | POA: Diagnosis not present

## 2015-08-19 DIAGNOSIS — I10 Essential (primary) hypertension: Secondary | ICD-10-CM | POA: Diagnosis not present

## 2015-08-19 DIAGNOSIS — M5020 Other cervical disc displacement, unspecified cervical region: Secondary | ICD-10-CM | POA: Insufficient documentation

## 2015-08-19 NOTE — Progress Notes (Deleted)
Established Abdominal Aortic Aneurysm  History of Present Illness  The patient is a 65 y.o. ([birth year]) male who presents with chief complaint: follow up for AAA.  Previous studies demonstrate an AAA, measuring 3.5 cm x 3.6 cm, R CIA: 3.5 cm, L CIA 1.8 cm.  The patient was sent for CTA to determine if he is a candidate for EVAR.  Since that study the patient has had acute lower abd pain and  L arm weakness leading him to the ED on 08/16/15.  The patient is an active smoker.  The patient's PMH, PSH, SH, and FamHx are unchanged from 07/30/15.  Current Outpatient Prescriptions  Medication Sig Dispense Refill  . amLODipine (NORVASC) 10 MG tablet Take 10 mg by mouth daily.    . chlorthalidone (HYGROTON) 25 MG tablet Take 25 mg by mouth daily. Reported on 07/31/2015    . diazepam (VALIUM) 5 MG tablet Take 5 mg by mouth every 6 (six) hours as needed for anxiety. Anxiety    . ibuprofen (ADVIL,MOTRIN) 400 MG tablet Take 1 tablet (400 mg total) by mouth 3 (three) times daily. 30 tablet 0  . lisinopril (PRINIVIL,ZESTRIL) 40 MG tablet Take 40 mg by mouth daily.    . methylPREDNISolone (MEDROL DOSEPAK) 4 MG TBPK tablet Take as stated on package 21 tablet 0  . omeprazole (PRILOSEC) 20 MG capsule Take 20 mg by mouth daily.    . ondansetron (ZOFRAN) 4 MG tablet Take 1 tablet (4 mg total) by mouth every 6 (six) hours. 12 tablet 0  . oxyCODONE-acetaminophen (PERCOCET/ROXICET) 5-325 MG tablet Take 1 tablet by mouth every 4 (four) hours as needed for severe pain. 15 tablet 0  . tamsulosin (FLOMAX) 0.4 MG CAPS capsule Take 1 capsule (0.4 mg total) by mouth daily after supper. 10 capsule 0  . traMADol (ULTRAM) 50 MG tablet Take 50 mg by mouth every 6 (six) hours as needed for moderate pain.      No current facility-administered medications for this visit.   Facility-Administered Medications Ordered in Other Visits  Medication Dose Route Frequency Provider Last Rate Last Dose  . rabies immune globulin (HYPERAB)  injection 1,720 Units  1,720 Units Intramuscular Once Duaine Dredge, PA-C      . rabies vaccine (RABAVERT/IMOVAX) injection 1 mL  1 mL Intramuscular Once Duaine Dredge, PA-C        Allergies  Allergen Reactions  . Contrast Media [Iodinated Diagnostic Agents] Other (See Comments)    Burning, needs pre meds  . Iohexol Hives, Itching and Other (See Comments)    Burning. Needs pre-meds   . Prednisone Other (See Comments)    Agitation and hyperactivity    On ROS today: ***, ***   Physical Examination  There were no vitals filed for this visit. There is no weight on file to calculate BMI.  General: A&O x 3, WD***, Obese, ***, Cachectic, ***, Ill appear, ***, Somulent,  Pulmonary: Sym exp, good air movt, CTAB, no rales, rhonchi, & wheezing***, + rales, ***, + rhonchi, ***, + wheezing,   Cardiac: RRR, Nl S1, S2, no Murmurs, rubs or gallops***, S3/S4, ***Irregularly, irregular rhythm and rate,  Vascular: Vessel Right Left  Radial ***Palpable ***Palpable  Brachial ***Palpable ***Palpable  Carotid ***Palpable, with***out bruit ***Palpable, with***out bruit  Aorta Not palpable N/A  Femoral ***Palpable ***Palpable  Popliteal Not palpable Not palpable  PT ***Palpable ***Palpable  DP ***Palpable ***Palpable   Gastrointestinal: soft, NTND, no G/R, no HSM, no masses, no CVAT B***, + AAA , ***,  Surg. Inc, ***TTP: ***, +G / +R,  Musculoskeletal: M/S 5/5 throughout *** except ***, Extremities without ischemic changes *** except  ***  Neurologic:  Pain and light touch intact in extremities *** except ***, Motor exam as listed above  Non-Invasive Vascular Imaging  CTA (08/16/15): 1. Obstructing RIGHT ureteral calculus as described on noncontrast CT exam same day. Increase in perinephric fluid on the RIGHT compared to exam earlier same day consistent with progressive obstruction. 2. No change in infrarenal abdominal aortic aneurysm compared to CT of 08/05/2015.  CTA  (08/05/15): Fusiform infrarenal abdominal aortic aneurysm, with greatest diameter measurement on on source/axial images at the origin of the inferior mesenteric artery, measuring 3.7 cm. Measurement at this level on the prior approximately 3.5 cm, less than 5 mm of interval growth. In addition, the aneurysm originates just beyond the takeoff of the lowest (right) renal artery, with essentially no infrarenal neck, and approximately 40 degrees of left-right angulation.  Continued growth of right common iliac artery aneurysm measuring 2.7 cm on the current study compared to a prior measurement of 2.3 cm.  Nephrolithiasis.  Diverticular disease.  Based on my review of both CTA, there are no evidence of imminent rupture or inflammation.  Neither the AAA nor the R CIA or L CIA aneurysms meet size repair criteria.  The anatomy is not compatible with the IFU for standard infrarenal devices so standard open repair would be needed vs FEVAR.   Medical Decision Making  The patient is a 65 y.o. male who presents with: asymptomatic small AAA, small L CIA aneurysm, medium R CIA aneurysm   Based on this patient's exam and diagnostic studies, he needs q6 month AAA duplex  The threshold for repair is AAA size > 5.5 cm, growth > 1 cm/yr, iliac artery aneurysm size >3.5 cm and symptomatic status.  I emphasized the importance of maximal medical management including strict control of blood pressure, blood glucose, and lipid levels, antiplatelet agents, obtaining regular exercise, and cessation of smoking.    Thank you for allowing [place] to participate in this patient's care.   Adele Barthel, MD, FACS [facility] Office: [phone] Pager: [phone]

## 2015-08-21 ENCOUNTER — Encounter: Payer: Medicare Other | Admitting: Vascular Surgery

## 2015-08-24 DIAGNOSIS — N201 Calculus of ureter: Secondary | ICD-10-CM | POA: Diagnosis not present

## 2015-08-30 NOTE — Progress Notes (Signed)
This encounter was created in error - please disregard.

## 2015-09-02 ENCOUNTER — Telehealth: Payer: Self-pay

## 2015-09-02 NOTE — Telephone Encounter (Signed)
Phone call from pt's wife.  Reported that pt. Has had a lot of pain from Kidney stones during past 3 wks., and unable to pass this on his own.  Was not able to keep appt. for the Cardiac Stress Test, due to amt. Of pain.  Stated the pt. Is to be scheduled for treatment of the kidney stone, and poss. placement of a stent in the ureter.  Also, stated the pt. will be seeing a Neurologist to eval. Neck and arm nerve damage.  Reported she had to cancel his f/u appt. With Dr. Bridgett Larsson on Friday.  Is questioning if Dr. Bridgett Larsson feels it is safe to have the procedure for the kidney stone?  Voiced concern of risk of rupture of AAA.  Discussed with Dr. Bridgett Larsson.  Advised that he recently reviewed a CT scan, when pt. Was seen in the ER.  Stated to reassure the pt. And wife, that the right Common Iliac Artery Aneurysm was smaller than was shown on the abdominal ultrasound.  Advised to proceed with kidney stone procedure, and to follow-up with him afterwards to further discuss the aneurysms.  Phone call to wife.  Advised of Dr. Lianne Moris recommendations.  Verb. Understanding.

## 2015-09-04 ENCOUNTER — Ambulatory Visit: Payer: Medicare Other | Admitting: Vascular Surgery

## 2015-09-07 DIAGNOSIS — R112 Nausea with vomiting, unspecified: Secondary | ICD-10-CM | POA: Diagnosis not present

## 2015-09-07 DIAGNOSIS — N201 Calculus of ureter: Secondary | ICD-10-CM | POA: Diagnosis not present

## 2015-09-07 DIAGNOSIS — M5412 Radiculopathy, cervical region: Secondary | ICD-10-CM | POA: Diagnosis not present

## 2015-09-07 DIAGNOSIS — M50222 Other cervical disc displacement at C5-C6 level: Secondary | ICD-10-CM | POA: Diagnosis not present

## 2015-09-07 DIAGNOSIS — M542 Cervicalgia: Secondary | ICD-10-CM | POA: Diagnosis not present

## 2015-10-20 NOTE — Progress Notes (Signed)
Established Abdominal Aortic Aneurysm   History of Present Illness  The patient is a 65 y.o. (Apr 21, 1950) male who presents with chief complaint: follow up for AAA.  Recent CTA studies demonstrate an AAA, measuring 3.7 cm.  R CIA aneurysm is 2.7 cm in diameter, L CIA aneurysm 1.9 cm in diameter.  The patient has been having severe abdominal and flank pain for the last 2-3 month due to kidney stones.  He was rescanned during this time period with no change in his aneurysmal disease.  The patient is a smoker.  This patient's return for follow up has been delayed by his kidney stones.  The patient's PMH, PSH, SH, and FamHx are unchanged from 08/16/15.  Current Outpatient Prescriptions  Medication Sig Dispense Refill  . amLODipine (NORVASC) 10 MG tablet Take 10 mg by mouth daily.    . chlorthalidone (HYGROTON) 25 MG tablet Take 25 mg by mouth daily. Reported on 07/31/2015    . diazepam (VALIUM) 5 MG tablet Take 5 mg by mouth every 6 (six) hours as needed for anxiety. Anxiety    . ibuprofen (ADVIL,MOTRIN) 400 MG tablet Take 1 tablet (400 mg total) by mouth 3 (three) times daily. 30 tablet 0  . lisinopril (PRINIVIL,ZESTRIL) 40 MG tablet Take 40 mg by mouth daily.    . methylPREDNISolone (MEDROL DOSEPAK) 4 MG TBPK tablet Take as stated on package 21 tablet 0  . omeprazole (PRILOSEC) 20 MG capsule Take 20 mg by mouth daily.    . ondansetron (ZOFRAN) 4 MG tablet Take 1 tablet (4 mg total) by mouth every 6 (six) hours. 12 tablet 0  . oxyCODONE-acetaminophen (PERCOCET/ROXICET) 5-325 MG tablet Take 1 tablet by mouth every 4 (four) hours as needed for severe pain. 15 tablet 0  . tamsulosin (FLOMAX) 0.4 MG CAPS capsule Take 1 capsule (0.4 mg total) by mouth daily after supper. 10 capsule 0  . traMADol (ULTRAM) 50 MG tablet Take 50 mg by mouth every 6 (six) hours as needed for moderate pain.      No current facility-administered medications for this visit.    Facility-Administered Medications Ordered in  Other Visits  Medication Dose Route Frequency Provider Last Rate Last Dose  . rabies immune globulin (HYPERAB) injection 1,720 Units  1,720 Units Intramuscular Once Duaine Dredge, PA-C      . rabies vaccine (RABAVERT/IMOVAX) injection 1 mL  1 mL Intramuscular Once Duaine Dredge, PA-C        Allergies  Allergen Reactions  . Contrast Media [Iodinated Diagnostic Agents] Other (See Comments)    Burning, needs pre meds  . Iohexol Hives, Itching and Other (See Comments)    Burning. Needs pre-meds   . Prednisone Other (See Comments)    Agitation and hyperactivity    On ROS today: passed kidney stone, no further abd or back pain currently   Physical Examination  Vitals:   10/23/15 0820  BP: 129/80  Pulse: 69  SpO2: 98%  Weight: 200 lb 14.4 oz (91.1 kg)  Height: 5\' 8"  (1.727 m)   Body mass index is 30.55 kg/m.  General: A&O x 3, WDWN  Pulmonary: Sym exp, good air movt, CTAB, no rales, rhonchi, & wheezing  Cardiac: RRR, Nl S1, S2, no Murmurs, rubs or gallops  Vascular: Vessel Right Left  Radial Palpable Palpable  Brachial Palpable Palpable  Carotid Palpable, without bruit Palpable, without bruit  Aorta Not palpable due to mod pannus N/A  Femoral Palpable Palpable  Popliteal Not palpable Not palpable  PT Palpable Palpable  DP Palpable Palpable   Gastrointestinal: soft, NTND, no G/R, no HSM, no masses, no CVAT B  Musculoskeletal: M/S 5/5 throughout , Extremities without ischemic changes   Neurologic:  Pain and light touch intact in extremities , Motor exam as listed above   Non-Invasive Vascular Imaging  CTA abd/pelvis (08/05/15) Fusiform infrarenal abdominal aortic aneurysm, with greatest diameter measurement on on source/axial images at the origin of the inferior mesenteric artery, measuring 3.7 cm. Measurement at this level on the prior approximately 3.5 cm, less than 5 mm of interval growth. In addition, the aneurysm originates just beyond the  takeoff of the lowest (right) renal artery, with essentially no infrarenal neck, and approximately 40 degrees of left-right angulation.  Continued growth of right common iliac artery aneurysm measuring 2.7 cm on the current study compared to a prior measurement of 2.3 cm.  Nephrolithiasis.  Diverticular disease.   Medical Decision Making  The patient is a 65 y.o. male who presents with: asymptomatic small AAA with bilateral small CIA aneurysms   The CTA which is a more accurate exam demonstrates a smaller R CIA aneurysm than previous measured on the duplex.    Additionally, this patient's aortic neck is too small to consider convention EVAR.  If EVAR is going to be considered, my have to consider FEVAR but fortunately none of his aneurysm meet repair criteria yet.  Based on the CTA, I recommend: q6 month aortic duplex.  The threshold for repair is AAA size > 5.5 cm, CIA aneurysm > 3.5 cm, growth > 1 cm/yr, and symptomatic status.  I emphasized the importance of maximal medical management including strict control of blood pressure, blood glucose, and lipid levels, antiplatelet agents, obtaining regular exercise, and cessation of smoking.    Thank you for allowing [place] to participate in this patient's care.   Adele Barthel, MD, FACS [facility] [place] Office: [phone] Pager: [phone]

## 2015-10-22 ENCOUNTER — Encounter: Payer: Self-pay | Admitting: Vascular Surgery

## 2015-10-23 ENCOUNTER — Ambulatory Visit (INDEPENDENT_AMBULATORY_CARE_PROVIDER_SITE_OTHER): Payer: Medicare Other | Admitting: Vascular Surgery

## 2015-10-23 ENCOUNTER — Encounter: Payer: Self-pay | Admitting: Vascular Surgery

## 2015-10-23 VITALS — BP 129/80 | HR 69 | Ht 68.0 in | Wt 200.9 lb

## 2015-10-23 DIAGNOSIS — I723 Aneurysm of iliac artery: Secondary | ICD-10-CM

## 2015-10-23 DIAGNOSIS — I714 Abdominal aortic aneurysm, without rupture, unspecified: Secondary | ICD-10-CM

## 2015-11-09 DIAGNOSIS — Z23 Encounter for immunization: Secondary | ICD-10-CM | POA: Diagnosis not present

## 2015-11-09 DIAGNOSIS — N23 Unspecified renal colic: Secondary | ICD-10-CM | POA: Diagnosis not present

## 2016-01-27 ENCOUNTER — Other Ambulatory Visit: Payer: Self-pay

## 2016-01-27 DIAGNOSIS — I713 Abdominal aortic aneurysm, ruptured, unspecified: Secondary | ICD-10-CM

## 2016-04-29 ENCOUNTER — Encounter: Payer: Self-pay | Admitting: Vascular Surgery

## 2016-05-04 NOTE — Progress Notes (Signed)
Established Abdominal Aortic Aneurysm  History of Present Illness  The patient is a 66 y.o. (May 24, 1950) male who presents with chief complaint: follow up for AAA.  Previous studies demonstrate an AAA, measuring 3.7 cm.  R CIA aneurysm was 2.7 cm and L CIA aneurysm was 1.9 cm.  The patient does NOT have back or abdominal pain current.  This patient had a episode of L arm paralysis due to spinal pathology requiring cervical spine surgery.  This has resolved at this point.  The patient is a smoker.  The patient's PMH, PSH, SH, and FamHx are unchanged from 10/23/15.  Current Outpatient Prescriptions  Medication Sig Dispense Refill  . amLODipine (NORVASC) 10 MG tablet Take 10 mg by mouth daily.    . chlorthalidone (HYGROTON) 25 MG tablet Take 25 mg by mouth daily. Reported on 07/31/2015    . diazepam (VALIUM) 5 MG tablet Take 5 mg by mouth every 6 (six) hours as needed for anxiety. Anxiety    . ibuprofen (ADVIL,MOTRIN) 400 MG tablet Take 1 tablet (400 mg total) by mouth 3 (three) times daily. 30 tablet 0  . lisinopril (PRINIVIL,ZESTRIL) 40 MG tablet Take 40 mg by mouth daily.    . methylPREDNISolone (MEDROL DOSEPAK) 4 MG TBPK tablet Take as stated on package 21 tablet 0  . omeprazole (PRILOSEC) 20 MG capsule Take 20 mg by mouth daily.    . ondansetron (ZOFRAN) 4 MG tablet Take 1 tablet (4 mg total) by mouth every 6 (six) hours. 12 tablet 0  . oxyCODONE-acetaminophen (PERCOCET/ROXICET) 5-325 MG tablet Take 1 tablet by mouth every 4 (four) hours as needed for severe pain. (Patient not taking: Reported on 10/23/2015) 15 tablet 0  . tamsulosin (FLOMAX) 0.4 MG CAPS capsule Take 1 capsule (0.4 mg total) by mouth daily after supper. (Patient not taking: Reported on 10/23/2015) 10 capsule 0  . traMADol (ULTRAM) 50 MG tablet Take 50 mg by mouth every 6 (six) hours as needed for moderate pain.      No current facility-administered medications for this visit.    Facility-Administered Medications Ordered in  Other Visits  Medication Dose Route Frequency Provider Last Rate Last Dose  . rabies immune globulin (HYPERAB) injection 1,720 Units  1,720 Units Intramuscular Once Duaine Dredge, PA-C      . rabies vaccine (RABAVERT/IMOVAX) injection 1 mL  1 mL Intramuscular Once Duaine Dredge, PA-C        Allergies  Allergen Reactions  . Contrast Media [Iodinated Diagnostic Agents] Other (See Comments)    Burning, needs pre meds  . Iohexol Hives, Itching and Other (See Comments)    Burning. Needs pre-meds   . Prednisone Other (See Comments)    Agitation and hyperactivity    On ROS today: no thromboembolic sx in feet, no abdominal or back pain   Physical Examination  Vitals:   05/06/16 0957  BP: 132/88  Pulse: (!) 59  Resp: (!) 21  Temp: 97.5 F (36.4 C)  TempSrc: Oral  SpO2: 95%  Weight: 201 lb 8 oz (91.4 kg)  Height: 5\' 8"  (1.727 m)   Body mass index is 30.64 kg/m.  General: A&O x 3, WDWN  Pulmonary: Sym exp, good air movt, CTAB, no rales, rhonchi, & wheezing  Cardiac: RRR, Nl S1, S2, no Murmurs, rubs or gallops  Vascular: Vessel Right Left  Radial Palpable Palpable  Brachial Palpable Palpable  Carotid Palpable, without bruit Palpable, without bruit  Aorta Not palpable N/A  Femoral Palpable Palpable  Popliteal Not palpable Not palpable  PT Faintly Palpable Not Palpable  DP Not Palpable Palpable   Gastrointestinal: soft, NTND, no G/R, no HSM, no masses, no CVAT B  Musculoskeletal: M/S 5/5 throughout , Extremities without ischemic changes   Neurologic:  Pain and light touch intact in extremities , Motor exam as listed above   Non-Invasive Vascular Imaging  AAA Duplex (05/04/2016)  Previous size: 3.5 cm x 3.5 cm (Date: 07/17/15)  Current size:  3.7 cm x 3.9 cm (Date: 05/06/2016 )  R CIA: 3.6 cm x 3.4 cm  L CIA" 1.8 cm x 1.9 cm  No significant change   Medical Decision Making  The patient is a 66 y.o. male who presents with: asymptomatic small AAA with  bilateral small CIA aneurysms   No significant change on aortic duplex today.  Previously R CIA measurements were roughly the same but the CTA demonstrated on R CIA 2.7 cm.  Based on this patient's exam and diagnostic studies, he needs q6 month AAA duplex.  The threshold for repair is AAA size > 5.5 cm, growth > 1 cm/yr, and symptomatic status.  I emphasized the importance of maximal medical management including strict control of blood pressure, blood glucose, and lipid levels, antiplatelet agents, obtaining regular exercise, and cessation of smoking.   We discussed signs and sx of rupture and thromboembolism.  The patient knows to seek immediate care in the event these develop.  Thank you for allowing [place] to participate in this patient's care.   Adele Barthel, MD, FACS [facility] Office: [phone] Pager: [phone]

## 2016-05-06 ENCOUNTER — Encounter: Payer: Self-pay | Admitting: Vascular Surgery

## 2016-05-06 ENCOUNTER — Ambulatory Visit (INDEPENDENT_AMBULATORY_CARE_PROVIDER_SITE_OTHER): Payer: Medicare Other | Admitting: Vascular Surgery

## 2016-05-06 ENCOUNTER — Ambulatory Visit (HOSPITAL_COMMUNITY)
Admission: RE | Admit: 2016-05-06 | Discharge: 2016-05-06 | Disposition: A | Discharge status: Home / Self Care | Payer: Medicare Other | Source: Ambulatory Visit | Attending: Vascular Surgery | Admitting: Vascular Surgery

## 2016-05-06 VITALS — BP 132/88 | HR 59 | Temp 97.5°F | Resp 21 | Ht 68.0 in | Wt 201.5 lb

## 2016-05-06 DIAGNOSIS — I714 Abdominal aortic aneurysm, without rupture, unspecified: Secondary | ICD-10-CM

## 2016-05-06 DIAGNOSIS — I723 Aneurysm of iliac artery: Secondary | ICD-10-CM

## 2016-05-06 DIAGNOSIS — I713 Abdominal aortic aneurysm, ruptured, unspecified: Secondary | ICD-10-CM

## 2016-05-09 NOTE — Addendum Note (Signed)
Addended by: Lianne Cure A on: 05/09/2016 04:53 PM   Modules accepted: Orders

## 2016-05-31 DIAGNOSIS — L820 Inflamed seborrheic keratosis: Secondary | ICD-10-CM | POA: Diagnosis not present

## 2016-06-14 DIAGNOSIS — I714 Abdominal aortic aneurysm, without rupture: Secondary | ICD-10-CM | POA: Diagnosis not present

## 2016-06-14 DIAGNOSIS — Z6834 Body mass index (BMI) 34.0-34.9, adult: Secondary | ICD-10-CM | POA: Diagnosis not present

## 2016-06-14 DIAGNOSIS — J449 Chronic obstructive pulmonary disease, unspecified: Secondary | ICD-10-CM | POA: Diagnosis not present

## 2016-06-15 ENCOUNTER — Other Ambulatory Visit (HOSPITAL_COMMUNITY): Payer: Self-pay | Admitting: Internal Medicine

## 2016-06-15 ENCOUNTER — Ambulatory Visit (HOSPITAL_COMMUNITY)
Admission: RE | Admit: 2016-06-15 | Discharge: 2016-06-15 | Disposition: A | Discharge status: Home / Self Care | Payer: Medicare Other | Source: Ambulatory Visit | Attending: Internal Medicine | Admitting: Internal Medicine

## 2016-06-15 DIAGNOSIS — R05 Cough: Secondary | ICD-10-CM | POA: Diagnosis not present

## 2016-06-15 DIAGNOSIS — R059 Cough, unspecified: Secondary | ICD-10-CM

## 2016-06-15 DIAGNOSIS — J42 Unspecified chronic bronchitis: Secondary | ICD-10-CM | POA: Diagnosis not present

## 2016-07-19 DIAGNOSIS — J449 Chronic obstructive pulmonary disease, unspecified: Secondary | ICD-10-CM | POA: Diagnosis not present

## 2016-07-19 DIAGNOSIS — R05 Cough: Secondary | ICD-10-CM | POA: Diagnosis not present

## 2016-11-07 NOTE — Progress Notes (Signed)
Established Abdominal Aortic Aneurysm   History of Present Illness   The patient is a 66 y.o. (September 04, 1950) male who presents with chief complaint: follow up for AAA.  Previous studies demonstrate an AAA, measuring 3.9 cm.  R CIA aneurysm was 3.6 cm and L CIA aneurysm was 1.9 cm on duplex.  The patient does NOT have back or abdominal pain current.  This patient had a episode of L arm paralysis due to spinal pathology requiring cervical spine surgery.  This has resolved at this point.  The patient is a smoker but is working to quit.  The patient's PMH, PSH, SH, and FamHx are unchanged from 05/06/16.  Current Outpatient Prescriptions  Medication Sig Dispense Refill  . amLODipine (NORVASC) 10 MG tablet Take 10 mg by mouth daily.    . chlorthalidone (HYGROTON) 25 MG tablet Take 25 mg by mouth daily. Reported on 07/31/2015    . diazepam (VALIUM) 5 MG tablet Take 5 mg by mouth every 6 (six) hours as needed for anxiety. Anxiety    . ibuprofen (ADVIL,MOTRIN) 400 MG tablet Take 1 tablet (400 mg total) by mouth 3 (three) times daily. 30 tablet 0  . lisinopril (PRINIVIL,ZESTRIL) 40 MG tablet Take 40 mg by mouth daily.    . methylPREDNISolone (MEDROL DOSEPAK) 4 MG TBPK tablet Take as stated on package 21 tablet 0  . omeprazole (PRILOSEC) 20 MG capsule Take 20 mg by mouth daily.    . ondansetron (ZOFRAN) 4 MG tablet Take 1 tablet (4 mg total) by mouth every 6 (six) hours. 12 tablet 0  . oxyCODONE-acetaminophen (PERCOCET/ROXICET) 5-325 MG tablet Take 1 tablet by mouth every 4 (four) hours as needed for severe pain. (Patient not taking: Reported on 10/23/2015) 15 tablet 0  . tamsulosin (FLOMAX) 0.4 MG CAPS capsule Take 1 capsule (0.4 mg total) by mouth daily after supper. (Patient not taking: Reported on 10/23/2015) 10 capsule 0  . traMADol (ULTRAM) 50 MG tablet Take 50 mg by mouth every 6 (six) hours as needed for moderate pain.      No current facility-administered medications for this visit.     Facility-Administered Medications Ordered in Other Visits  Medication Dose Route Frequency Provider Last Rate Last Dose  . rabies immune globulin (HYPERAB) injection 1,720 Units  1,720 Units Intramuscular Once Jennye Boroughs M, PA-C      . rabies vaccine (RABAVERT/IMOVAX) injection 1 mL  1 mL Intramuscular Once Duaine Dredge, PA-C        On ROS today: some smoking related pulmonary issues including COPD, no abdominal pain, +chronic back pain   Physical Examination   Vitals:   11/11/16 0824  BP: (!) 150/94  Pulse: (!) 104  Resp: 20  Temp: (!) 97.1 F (36.2 C)  SpO2: 93%  Weight: 210 lb (95.3 kg)  Height: 5\' 8"  (1.727 m)   Body mass index is 31.93 kg/m.  General Alert, O x 3, Obese, NAD  Pulmonary Sym exp, good B air movt, CTA B  Cardiac RRR, Nl S1, S2, no Murmurs, No rubs, No S3,S4  Vascular Vessel Right Left  Radial Palpable Palpable  Brachial Palpable Palpable  Carotid Palpable, No Bruit Palpable, No Bruit  Aorta Not palpable N/A  Femoral Palpable Palpable  Popliteal Not palpable Not palpable  PT Not palpable Not palpable  DP Faintly palpable Faintly palpable    Gastro- intestinal soft, non-distended, non-tender to palpation, No guarding or rebound, no HSM, no masses, no CVAT B, No palpable prominent aortic pulse,  Musculo- skeletal M/S 5/5 throughout  , Extremities without ischemic changes except missing R 2nd distal phalange, No edema present, No visible varicosities , No Lipodermatosclerosis present  Neurologic Pain and light touch intact in extremities , Motor exam as listed above     Non-Invasive Vascular Imaging   AAA Duplex (11/11/2016)  Current size: 4.1 cm x 4.1 cm  Previous size: 3.7cm x 2.8 cm (05/06/16)  R CIA: 3.7 cm x 3.7 cm  L CIA: 2.0 cm x 1.8 cm   Medical Decision Making   Zaid Tomes Garis is a 66 y.o. (11-16-1950) male who presents with: asymptomatic small AAA with bilateral small CIA aneurysms   Based on this patient's exam  and diagnostic studies, he needs q6 month AAA duplex.  The threshold for repair is AAA size > 5.5 cm, growth > 1 cm/yr, and symptomatic status.  I reiterated the need to quit smoking given the number one risk factor for aneurysmal growth is smoking.  I emphasized the importance of maximal medical management including strict control of blood pressure, blood glucose, and lipid levels, antiplatelet agents, obtaining regular exercise, and cessation of smoking.    Thank you for allowing [place] to participate in this patient's care.   Adele Barthel, MD, FACS [facility] [place] Office: [phone] Pager: [phone]

## 2016-11-11 ENCOUNTER — Ambulatory Visit (INDEPENDENT_AMBULATORY_CARE_PROVIDER_SITE_OTHER): Payer: Medicare Other | Admitting: Vascular Surgery

## 2016-11-11 ENCOUNTER — Ambulatory Visit (HOSPITAL_COMMUNITY)
Admission: RE | Admit: 2016-11-11 | Discharge: 2016-11-11 | Disposition: A | Discharge status: Home / Self Care | Payer: Medicare Other | Source: Ambulatory Visit | Attending: Vascular Surgery | Admitting: Vascular Surgery

## 2016-11-11 ENCOUNTER — Encounter: Payer: Self-pay | Admitting: Vascular Surgery

## 2016-11-11 VITALS — BP 150/94 | HR 104 | Temp 97.1°F | Resp 20 | Ht 68.0 in | Wt 210.0 lb

## 2016-11-11 DIAGNOSIS — I714 Abdominal aortic aneurysm, without rupture, unspecified: Secondary | ICD-10-CM

## 2016-11-11 DIAGNOSIS — I723 Aneurysm of iliac artery: Secondary | ICD-10-CM | POA: Insufficient documentation

## 2016-11-17 NOTE — Addendum Note (Signed)
Addended by: Lianne Cure A on: 11/17/2016 04:08 PM   Modules accepted: Orders

## 2016-11-22 DIAGNOSIS — J209 Acute bronchitis, unspecified: Secondary | ICD-10-CM | POA: Diagnosis not present

## 2016-11-22 DIAGNOSIS — I10 Essential (primary) hypertension: Secondary | ICD-10-CM | POA: Diagnosis not present

## 2016-11-22 DIAGNOSIS — J42 Unspecified chronic bronchitis: Secondary | ICD-10-CM | POA: Diagnosis not present

## 2017-01-10 ENCOUNTER — Ambulatory Visit (HOSPITAL_COMMUNITY)
Admission: RE | Admit: 2017-01-10 | Discharge: 2017-01-10 | Disposition: A | Discharge status: Home / Self Care | Payer: Medicare Other | Source: Ambulatory Visit | Attending: Internal Medicine | Admitting: Internal Medicine

## 2017-01-10 ENCOUNTER — Other Ambulatory Visit (HOSPITAL_COMMUNITY): Payer: Self-pay | Admitting: Internal Medicine

## 2017-01-10 DIAGNOSIS — M25561 Pain in right knee: Secondary | ICD-10-CM

## 2017-01-10 DIAGNOSIS — S8001XA Contusion of right knee, initial encounter: Secondary | ICD-10-CM | POA: Diagnosis not present

## 2017-01-10 DIAGNOSIS — Z23 Encounter for immunization: Secondary | ICD-10-CM | POA: Diagnosis not present

## 2017-01-10 DIAGNOSIS — S8991XA Unspecified injury of right lower leg, initial encounter: Secondary | ICD-10-CM | POA: Diagnosis not present

## 2017-01-13 ENCOUNTER — Inpatient Hospital Stay (HOSPITAL_COMMUNITY): Payer: Medicare Other

## 2017-01-13 ENCOUNTER — Inpatient Hospital Stay (HOSPITAL_COMMUNITY)
Admission: EM | Admit: 2017-01-13 | Discharge: 2017-01-15 | DRG: 378 | Disposition: A | Discharge status: Home / Self Care | Payer: Medicare Other | Attending: Internal Medicine | Admitting: Internal Medicine

## 2017-01-13 ENCOUNTER — Other Ambulatory Visit: Payer: Self-pay

## 2017-01-13 ENCOUNTER — Encounter (HOSPITAL_COMMUNITY): Payer: Self-pay

## 2017-01-13 DIAGNOSIS — K2970 Gastritis, unspecified, without bleeding: Secondary | ICD-10-CM | POA: Diagnosis present

## 2017-01-13 DIAGNOSIS — K922 Gastrointestinal hemorrhage, unspecified: Secondary | ICD-10-CM

## 2017-01-13 DIAGNOSIS — K264 Chronic or unspecified duodenal ulcer with hemorrhage: Principal | ICD-10-CM | POA: Diagnosis present

## 2017-01-13 DIAGNOSIS — K219 Gastro-esophageal reflux disease without esophagitis: Secondary | ICD-10-CM | POA: Diagnosis present

## 2017-01-13 DIAGNOSIS — Z6831 Body mass index (BMI) 31.0-31.9, adult: Secondary | ICD-10-CM

## 2017-01-13 DIAGNOSIS — T39395A Adverse effect of other nonsteroidal anti-inflammatory drugs [NSAID], initial encounter: Secondary | ICD-10-CM | POA: Diagnosis present

## 2017-01-13 DIAGNOSIS — K2980 Duodenitis without bleeding: Secondary | ICD-10-CM | POA: Diagnosis present

## 2017-01-13 DIAGNOSIS — X58XXXA Exposure to other specified factors, initial encounter: Secondary | ICD-10-CM | POA: Diagnosis present

## 2017-01-13 DIAGNOSIS — Z79899 Other long term (current) drug therapy: Secondary | ICD-10-CM

## 2017-01-13 DIAGNOSIS — K222 Esophageal obstruction: Secondary | ICD-10-CM | POA: Diagnosis present

## 2017-01-13 DIAGNOSIS — R110 Nausea: Secondary | ICD-10-CM | POA: Diagnosis not present

## 2017-01-13 DIAGNOSIS — K921 Melena: Secondary | ICD-10-CM | POA: Diagnosis not present

## 2017-01-13 DIAGNOSIS — R05 Cough: Secondary | ICD-10-CM | POA: Diagnosis not present

## 2017-01-13 DIAGNOSIS — F419 Anxiety disorder, unspecified: Secondary | ICD-10-CM | POA: Diagnosis present

## 2017-01-13 DIAGNOSIS — R072 Precordial pain: Secondary | ICD-10-CM

## 2017-01-13 DIAGNOSIS — Z91041 Radiographic dye allergy status: Secondary | ICD-10-CM | POA: Diagnosis not present

## 2017-01-13 DIAGNOSIS — Z7951 Long term (current) use of inhaled steroids: Secondary | ICD-10-CM | POA: Diagnosis not present

## 2017-01-13 DIAGNOSIS — D62 Acute posthemorrhagic anemia: Secondary | ICD-10-CM

## 2017-01-13 DIAGNOSIS — I714 Abdominal aortic aneurysm, without rupture, unspecified: Secondary | ICD-10-CM | POA: Diagnosis present

## 2017-01-13 DIAGNOSIS — E669 Obesity, unspecified: Secondary | ICD-10-CM | POA: Diagnosis present

## 2017-01-13 DIAGNOSIS — Z888 Allergy status to other drugs, medicaments and biological substances status: Secondary | ICD-10-CM | POA: Diagnosis not present

## 2017-01-13 DIAGNOSIS — Z791 Long term (current) use of non-steroidal anti-inflammatories (NSAID): Secondary | ICD-10-CM | POA: Diagnosis not present

## 2017-01-13 DIAGNOSIS — J449 Chronic obstructive pulmonary disease, unspecified: Secondary | ICD-10-CM | POA: Diagnosis present

## 2017-01-13 DIAGNOSIS — F17210 Nicotine dependence, cigarettes, uncomplicated: Secondary | ICD-10-CM | POA: Diagnosis present

## 2017-01-13 DIAGNOSIS — R197 Diarrhea, unspecified: Secondary | ICD-10-CM | POA: Diagnosis not present

## 2017-01-13 DIAGNOSIS — F329 Major depressive disorder, single episode, unspecified: Secondary | ICD-10-CM | POA: Diagnosis present

## 2017-01-13 DIAGNOSIS — R079 Chest pain, unspecified: Secondary | ICD-10-CM | POA: Diagnosis not present

## 2017-01-13 DIAGNOSIS — Z720 Tobacco use: Secondary | ICD-10-CM | POA: Diagnosis present

## 2017-01-13 DIAGNOSIS — R0602 Shortness of breath: Secondary | ICD-10-CM | POA: Diagnosis not present

## 2017-01-13 DIAGNOSIS — K2951 Unspecified chronic gastritis with bleeding: Secondary | ICD-10-CM | POA: Diagnosis not present

## 2017-01-13 DIAGNOSIS — I10 Essential (primary) hypertension: Secondary | ICD-10-CM | POA: Diagnosis present

## 2017-01-13 DIAGNOSIS — R0600 Dyspnea, unspecified: Secondary | ICD-10-CM

## 2017-01-13 HISTORY — DX: Chronic obstructive pulmonary disease, unspecified: J44.9

## 2017-01-13 LAB — COMPREHENSIVE METABOLIC PANEL
ALK PHOS: 40 U/L (ref 38–126)
ALT: 17 U/L (ref 17–63)
ANION GAP: 6 (ref 5–15)
AST: 24 U/L (ref 15–41)
Albumin: 3.5 g/dL (ref 3.5–5.0)
BILIRUBIN TOTAL: 0.4 mg/dL (ref 0.3–1.2)
BUN: 36 mg/dL — ABNORMAL HIGH (ref 6–20)
CALCIUM: 9 mg/dL (ref 8.9–10.3)
CO2: 25 mmol/L (ref 22–32)
CREATININE: 1.1 mg/dL (ref 0.61–1.24)
Chloride: 105 mmol/L (ref 101–111)
GFR calc non Af Amer: >60 mL/min (ref >60)
Glucose, Bld: 111 mg/dL — ABNORMAL HIGH (ref 65–99)
Potassium: 4 mmol/L (ref 3.5–5.1)
Sodium: 136 mmol/L (ref 135–145)
TOTAL PROTEIN: 6.1 g/dL — AB (ref 6.5–8.1)

## 2017-01-13 LAB — URINALYSIS, COMPLETE (UACMP) WITH MICROSCOPIC
Bilirubin Urine: NEGATIVE
Glucose, UA: NEGATIVE mg/dL
HGB URINE DIPSTICK: NEGATIVE
Ketones, ur: NEGATIVE mg/dL
Leukocytes, UA: NEGATIVE
Nitrite: NEGATIVE
Protein, ur: NEGATIVE mg/dL
SPECIFIC GRAVITY, URINE: 1.021 (ref 1.005–1.030)
pH: 5 (ref 5.0–8.0)

## 2017-01-13 LAB — CBC WITH DIFFERENTIAL/PLATELET
Basophils Absolute: 0 10*3/uL (ref 0.0–0.1)
Basophils Relative: 0 %
Eosinophils Absolute: 0.4 10*3/uL (ref 0.0–0.7)
Eosinophils Relative: 3 %
HEMATOCRIT: 26.5 % — AB (ref 39.0–52.0)
HEMOGLOBIN: 8.8 g/dL — AB (ref 13.0–17.0)
LYMPHS ABS: 3.3 10*3/uL (ref 0.7–4.0)
LYMPHS PCT: 24 %
MCH: 32.8 pg (ref 26.0–34.0)
MCHC: 33.2 g/dL (ref 30.0–36.0)
MCV: 98.9 fL (ref 78.0–100.0)
MONOS PCT: 13 %
Monocytes Absolute: 1.8 10*3/uL — ABNORMAL HIGH (ref 0.1–1.0)
NEUTROS ABS: 8.3 10*3/uL — AB (ref 1.7–7.7)
NEUTROS PCT: 60 %
Platelets: 255 10*3/uL (ref 150–400)
RBC: 2.68 MIL/uL — ABNORMAL LOW (ref 4.22–5.81)
RDW: 13.9 % (ref 11.5–15.5)
WBC: 13.9 10*3/uL — ABNORMAL HIGH (ref 4.0–10.5)

## 2017-01-13 LAB — TROPONIN I
Troponin I: <0.03 ng/mL (ref <0.03)
Troponin I: <0.03 ng/mL (ref <0.03)

## 2017-01-13 LAB — POC OCCULT BLOOD, ED: FECAL OCCULT BLD: POSITIVE — AB

## 2017-01-13 MED ORDER — POTASSIUM CHLORIDE IN NACL 20-0.9 MEQ/L-% IV SOLN
INTRAVENOUS | Status: DC
  Administered 2017-01-13 – 2017-01-14 (×2): via INTRAVENOUS

## 2017-01-13 MED ORDER — SODIUM CHLORIDE 0.9 % IV SOLN
80.0000 mg | Freq: Once | INTRAVENOUS | Status: AC
  Administered 2017-01-13: 80 mg via INTRAVENOUS
  Filled 2017-01-13: qty 80

## 2017-01-13 MED ORDER — IPRATROPIUM-ALBUTEROL 0.5-2.5 (3) MG/3ML IN SOLN
3.0000 mL | Freq: Three times a day (TID) | RESPIRATORY_TRACT | Status: DC
  Administered 2017-01-13 – 2017-01-15 (×5): 3 mL via RESPIRATORY_TRACT
  Filled 2017-01-13 (×5): qty 3

## 2017-01-13 MED ORDER — ONDANSETRON HCL 4 MG PO TABS: 4.0000 mg | ORAL_TABLET | Freq: Four times a day (QID) | ORAL | Status: DC | PRN

## 2017-01-13 MED ORDER — METOCLOPRAMIDE HCL 5 MG/ML IJ SOLN
10.0000 mg | Freq: Once | INTRAMUSCULAR | Status: AC
  Administered 2017-01-14: 10 mg via INTRAVENOUS
  Filled 2017-01-13: qty 2

## 2017-01-13 MED ORDER — DIAZEPAM 5 MG PO TABS
5.0000 mg | ORAL_TABLET | Freq: Four times a day (QID) | ORAL | Status: DC | PRN
Start: 2017-01-13 — End: 2017-01-15
  Administered 2017-01-13 (×2): 5 mg via ORAL
  Filled 2017-01-13 (×2): qty 1

## 2017-01-13 MED ORDER — TRAMADOL HCL 50 MG PO TABS: 50.0000 mg | ORAL_TABLET | Freq: Four times a day (QID) | ORAL | Status: DC | PRN

## 2017-01-13 MED ORDER — ACETAMINOPHEN 325 MG PO TABS: 650.0000 mg | ORAL_TABLET | Freq: Four times a day (QID) | ORAL | Status: DC | PRN

## 2017-01-13 MED ORDER — SODIUM CHLORIDE 0.9 % IV BOLUS (SEPSIS)
1000.0000 mL | Freq: Once | INTRAVENOUS | Status: AC
  Administered 2017-01-13: 1000 mL via INTRAVENOUS

## 2017-01-13 MED ORDER — ONDANSETRON HCL 4 MG/2ML IJ SOLN: 4.0000 mg | Freq: Four times a day (QID) | INTRAMUSCULAR | Status: DC | PRN

## 2017-01-13 MED ORDER — FLUTICASONE FUROATE-VILANTEROL 200-25 MCG/INH IN AEPB
1.0000 | INHALATION_SPRAY | Freq: Every day | RESPIRATORY_TRACT | Status: DC
  Administered 2017-01-14 – 2017-01-15 (×2): 1 via RESPIRATORY_TRACT
  Filled 2017-01-13: qty 28

## 2017-01-13 MED ORDER — ACETAMINOPHEN 650 MG RE SUPP: 650.0000 mg | Freq: Four times a day (QID) | RECTAL | Status: DC | PRN

## 2017-01-13 MED ORDER — SODIUM CHLORIDE 0.9 % IV SOLN
8.0000 mg/h | INTRAVENOUS | Status: DC
  Administered 2017-01-13 – 2017-01-14 (×3): 8 mg/h via INTRAVENOUS
  Filled 2017-01-13 (×8): qty 80

## 2017-01-13 MED ORDER — IPRATROPIUM-ALBUTEROL 0.5-2.5 (3) MG/3ML IN SOLN
3.0000 mL | Freq: Three times a day (TID) | RESPIRATORY_TRACT | Status: DC
  Filled 2017-01-13: qty 3

## 2017-01-13 NOTE — H&P (Signed)
History and Physical  Jeremy Mclean XLK:[id number] DOB: [birth year] DOA: 01/13/2017   PCP: Asencion Noble, MD   Patient coming from: Home  Chief Complaint: melena, fatigue  HPI:  Jeremy Mclean is a 66 y.o. male with medical history of AAA, essential hypertension, COPD, tobacco abuse, and depression presenting with 5-6 days of melanotic stools.  The patient has been taking BC powders on a daily basis for the past 6 months.  In addition, the patient has been taking ibuprofen off and on, at least twice per week for the last 6 months.  He denies any fevers, chills, chest pain, nausea, vomiting, diarrhea, hematochezia, hematemesis.  He has complained of some epigastric burning, shortness of breath and dyspnea on exertion with associated dizziness for the past 4-5 days.  Unfortunately, he continues to smoke.  He denies any worsening cough or sputum production. In the emergency department, the patient was afebrile hemodynamically stable with 100% oxygen saturation.  BMP and LFTs were unremarkable.  Hemoglobin was 8.8 with WBC 13.9.  His hemoglobin was 14.2 on August 18, 2015.  EKG shows sinus rhythm with nonspecific ST-T wave changes.  Fecal occult blood test showed melanotic stool without blood.  Assessment/Plan: Acute blood loss anemia/symptomatic anemia -Likely secondary to upper GI bleed -Type and screen  Melanotic stool -Concerned about upper GI bleed secondary to NSAIDs -Continue Protonix drip -Clear liquid diet -GI consult  COPD/tobacco abuse -Continue Breo -start Duonebs -Tobacco cessation discussed -NicoDerm patch deferred  Essential hypertension -Holding amlodipine, lisinopril, and chlorthalidone secondary to soft blood pressure  Epigastric discomfort/chest discomfort -Cycle troponins -Chest x-ray  Leukocytosis -Patient is afebrile hemodynamically stable -UA and urine culture -Chest x-ray -Monitor clinically off antibiotics      Past Medical History:  Diagnosis  Date  . AAA (abdominal aortic aneurysm) ([place])   . Anxiety   . Arthritis   . COPD (chronic obstructive pulmonary disease) ([place])   . Depression   . Diverticulitis   . GERD (gastroesophageal reflux disease)   . Hypertension   . Kidney stone    Past Surgical History:  Procedure Laterality Date  . BACK SURGERY     cervical fusion  . COLONOSCOPY N/A 04/09/2014   Procedure: COLONOSCOPY;  Surgeon: Rogene Houston, MD;  Location: [facility];  Service: Endoscopy;  Laterality: N/A;  730 - moved to 7:30 - Ann to notify pt  . CYSTOSCOPY     with stent  . kidney stent    . Neck sx with plate     Social History:  reports that he has been smoking cigarettes.  He has a 40.00 pack-year smoking history. he has never used smokeless tobacco. He reports that he does not drink alcohol or use drugs.   Family History  Problem Relation Age of Onset  . Diabetes Mother   . Cancer Father   . Cancer Other   . Diabetes Other      Allergies  Allergen Reactions  . Contrast Media [Iodinated Diagnostic Agents] Other (See Comments)    Burning, needs pre meds  . Iohexol Hives, Itching and Other (See Comments)    Burning. Needs pre-meds   . Prednisone Other (See Comments)    Agitation and hyperactivity     Prior to Admission medications   Medication Sig Start Date End Date Taking? Authorizing Provider  albuterol (PROVENTIL HFA;VENTOLIN HFA) 108 (90 Base) MCG/ACT inhaler Inhale 1-2 puffs into the lungs every 6 (six) hours as needed for wheezing or shortness  of breath.   Yes [provider]  amLODipine (NORVASC) 10 MG tablet Take 10 mg by mouth daily.   Yes [provider]  BREO ELLIPTA 200-25 MCG/INH AEPB Inhale 1 puff into the lungs daily. 11/29/16  Yes [provider]  diazepam (VALIUM) 5 MG tablet Take 5 mg by mouth every 6 (six) hours as needed for anxiety. Anxiety   Yes [provider]  lisinopril (PRINIVIL,ZESTRIL) 40 MG tablet Take 40 mg by mouth daily.   Yes  [provider]  traMADol (ULTRAM) 50 MG tablet Take 50 mg by mouth every 6 (six) hours as needed for moderate pain.    Yes [provider]  chlorthalidone (HYGROTON) 25 MG tablet Take 25 mg by mouth daily. Reported on 07/31/2015 06/12/14   [provider]  ibuprofen (ADVIL,MOTRIN) 400 MG tablet Take 1 tablet (400 mg total) by mouth 3 (three) times daily. 08/16/15   Rancour, Annie Main, MD    Review of Systems:  Constitutional:  No weight loss, night sweats, Fevers, chills Head&Eyes: No headache.  No vision loss.  No eye pain or scotoma ENT:  No Difficulty swallowing,Tooth/dental problems,Sore throat,  No ear ache, post nasal drip,  Cardio-vascular:  No Orthopnea, PND, swelling in lower extremities,  dizziness, palpitations  GI:  No  abdominal pain, nausea, vomiting, diarrhea, loss of appetite, hematochezia, melena, heartburn, indigestion, Resp:  No coughing up of blood .No wheezing.No chest wall deformity  Skin:  no rash or lesions.  GU:  no dysuria, change in color of urine, no urgency or frequency. No flank pain.  Musculoskeletal:  No joint pain or swelling. No decreased range of motion. No back pain.  Psych:  No change in mood or affect. No depression or anxiety. Neurologic: No headache, no dysesthesia, no focal weakness, no vision loss. No syncope  Physical Exam: Vitals:   01/13/17 1100 01/13/17 1130 01/13/17 1151 01/13/17 1200  BP: 116/71 101/63 101/63 109/74  Pulse:  73 75 77  Resp: 17 14 16 16   Temp:   98.6 F (37 C)   TempSrc:   Oral   SpO2:  100% 100% 100%  Weight:       General:  A&O x 3, NAD, nontoxic, pleasant/cooperative Head/Eye: No conjunctival hemorrhage, no icterus, [place]/AT, No nystagmus ENT:  No icterus,  No thrush, good dentition, no pharyngeal exudate Neck:  No masses, no lymphadenpathy, no bruits CV:  RRR, no rub, no gallop, no S3 Lung: Bibasilar rales.  Bilateral expiratory wheeze.  Good air movement. Abdomen: soft/epigastric  discomfort, +BS, nondistended, no peritoneal signs Ext: No cyanosis, No rashes, No petechiae, No lymphangitis, No edema Neuro: CNII-XII intact, strength 4/5 in bilateral upper and lower extremities, no dysmetria  Labs on Admission:  Basic Metabolic Panel: Recent Labs  Lab 01/13/17 1015  NA 136  K 4.0  CL 105  CO2 25  GLUCOSE 111*  BUN 36*  CREATININE 1.10  CALCIUM 9.0   Liver Function Tests: Recent Labs  Lab 01/13/17 1015  AST 24  ALT 17  ALKPHOS 40  BILITOT 0.4  PROT 6.1*  ALBUMIN 3.5   No results for input(s): LIPASE, AMYLASE in the last 168 hours. No results for input(s): AMMONIA in the last 168 hours. CBC: Recent Labs  Lab 01/13/17 1015  WBC 13.9*  NEUTROABS 8.3*  HGB 8.8*  HCT 26.5*  MCV 98.9  PLT 255   Coagulation Profile: No results for input(s): INR, PROTIME in the last 168 hours. Cardiac Enzymes: Recent Labs  Lab  01/13/17 1015  TROPONINI <0.03   BNP: Invalid input(s): POCBNP CBG: No results for input(s): GLUCAP in the last 168 hours. Urine analysis:    Component Value Date/Time   COLORURINE AMBER (A) 08/16/2015 1716   APPEARANCEUR CLOUDY (A) 08/16/2015 1716   LABSPEC 1.023 08/16/2015 1716   PHURINE 6.5 08/16/2015 1716   GLUCOSEU NEGATIVE 08/16/2015 1716   HGBUR LARGE (A) 08/16/2015 1716   BILIRUBINUR NEGATIVE 08/16/2015 1716   KETONESUR 15 (A) 08/16/2015 1716   PROTEINUR 100 (A) 08/16/2015 1716   UROBILINOGEN 0.2 04/09/2013 1215   NITRITE NEGATIVE 08/16/2015 1716   LEUKOCYTESUR NEGATIVE 08/16/2015 1716   Sepsis Labs: @LABRCNTIP (procalcitonin:4,lacticidven:4) )No results found for this or any previous visit (from the past 240 hour(s)).   Radiological Exams on Admission: No results found.  EKG: Independently reviewed.  Sinus rhythm, nonspecific ST-T wave changes    Time spent:60 minutes Code Status:   FULL Family Communication:  No Family at bedside Disposition Plan: expect 2 day hospitalization Consults called:  GI--Fields DVT Prophylaxis: [place] Lovenox/Heparin   SCDs  Annalie Wenner, DO  [facility] Pager [phone]  If 7PM-7AM, please contact night-coverage [email or web address] Password [email or web address] 01/13/2017, 1:01 PM

## 2017-01-13 NOTE — ED Triage Notes (Signed)
Pt brought in by EMS due to increasing weakness . Pt reports dark stools for 4 days. P reports burning feeling in abdomen. No vomiting
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
## 2017-01-13 NOTE — Progress Notes (Signed)
Attempted to gain second IV access. Charge nurse attempted twice and was unsuccessful. Pt refused another attempt by [facility]. Pt stated he was fine and did not want another IV.

## 2017-01-14 ENCOUNTER — Encounter (HOSPITAL_COMMUNITY): Payer: Self-pay | Admitting: Gastroenterology

## 2017-01-14 ENCOUNTER — Encounter (HOSPITAL_COMMUNITY)
Admission: EM | Disposition: A | Discharge status: Home / Self Care | Payer: Self-pay | Source: Home / Self Care | Attending: Internal Medicine

## 2017-01-14 DIAGNOSIS — K2970 Gastritis, unspecified, without bleeding: Secondary | ICD-10-CM

## 2017-01-14 DIAGNOSIS — K922 Gastrointestinal hemorrhage, unspecified: Secondary | ICD-10-CM

## 2017-01-14 DIAGNOSIS — K222 Esophageal obstruction: Secondary | ICD-10-CM

## 2017-01-14 HISTORY — PX: ESOPHAGOGASTRODUODENOSCOPY: SHX5428

## 2017-01-14 HISTORY — PX: BIOPSY: SHX5522

## 2017-01-14 LAB — CBC
HEMATOCRIT: 22.1 % — AB (ref 39.0–52.0)
Hemoglobin: 7.1 g/dL — ABNORMAL LOW (ref 13.0–17.0)
MCH: 32.1 pg (ref 26.0–34.0)
MCHC: 32.1 g/dL (ref 30.0–36.0)
MCV: 100 fL (ref 78.0–100.0)
Platelets: 229 10*3/uL (ref 150–400)
RBC: 2.21 MIL/uL — ABNORMAL LOW (ref 4.22–5.81)
RDW: 14.4 % (ref 11.5–15.5)
WBC: 9.8 10*3/uL (ref 4.0–10.5)

## 2017-01-14 LAB — PROTIME-INR
INR: 1.09
PROTHROMBIN TIME: 14 s (ref 11.4–15.2)

## 2017-01-14 LAB — TROPONIN I: Troponin I: <0.03 ng/mL (ref <0.03)

## 2017-01-14 LAB — APTT: APTT: 28 s (ref 24–36)

## 2017-01-14 LAB — ABO/RH: ABO/RH(D): O NEG

## 2017-01-14 LAB — PREPARE RBC (CROSSMATCH)

## 2017-01-14 SURGERY — EGD (ESOPHAGOGASTRODUODENOSCOPY)
Anesthesia: Moderate Sedation

## 2017-01-14 MED ORDER — MEPERIDINE HCL 100 MG/ML IJ SOLN
INTRAMUSCULAR | Status: AC
  Filled 2017-01-14: qty 2

## 2017-01-14 MED ORDER — LIDOCAINE VISCOUS 2 % MT SOLN
OROMUCOSAL | Status: DC | PRN
  Administered 2017-01-14: 1 via OROMUCOSAL

## 2017-01-14 MED ORDER — PANTOPRAZOLE SODIUM 40 MG PO TBEC
40.0000 mg | DELAYED_RELEASE_TABLET | Freq: Two times a day (BID) | ORAL | Status: DC
  Administered 2017-01-15: 40 mg via ORAL
  Filled 2017-01-14: qty 1

## 2017-01-14 MED ORDER — MEPERIDINE HCL 100 MG/ML IJ SOLN
INTRAMUSCULAR | Status: DC | PRN
  Administered 2017-01-14 (×2): 25 mg via INTRAVENOUS
  Administered 2017-01-14: 50 mg via INTRAVENOUS

## 2017-01-14 MED ORDER — LIDOCAINE VISCOUS 2 % MT SOLN
OROMUCOSAL | Status: AC
  Filled 2017-01-14: qty 15

## 2017-01-14 MED ORDER — SODIUM CHLORIDE 0.9 % IV SOLN
8.0000 mg/h | INTRAVENOUS | Status: AC
  Administered 2017-01-14: 8 mg/h via INTRAVENOUS
  Filled 2017-01-14: qty 80

## 2017-01-14 MED ORDER — SODIUM CHLORIDE 0.9 % IV SOLN
Freq: Once | INTRAVENOUS | Status: AC
  Administered 2017-01-14: 1 mL via INTRAVENOUS

## 2017-01-14 MED ORDER — MIDAZOLAM HCL 5 MG/5ML IJ SOLN
INTRAMUSCULAR | Status: AC
  Filled 2017-01-14: qty 10

## 2017-01-14 MED ORDER — MIDAZOLAM HCL 5 MG/5ML IJ SOLN
INTRAMUSCULAR | Status: DC | PRN
  Administered 2017-01-14: 2 mg via INTRAVENOUS
  Administered 2017-01-14 (×2): 1 mg via INTRAVENOUS
  Administered 2017-01-14: 2 mg via INTRAVENOUS

## 2017-01-14 NOTE — Plan of Care (Signed)
Pt is progressing 

## 2017-01-14 NOTE — Interval H&P Note (Signed)
History and Physical Interval Note:  01/14/2017 10:13 AM  Jeremy Mclean A Punch  has presented today for surgery, with the diagnosis of MELENA  The various methods of treatment have been discussed with the patient and family. After consideration of risks, benefits and other options for treatment, the patient has consented to  Procedure(s): ESOPHAGOGASTRODUODENOSCOPY (EGD) (N/A) as a surgical intervention .  The patient's history has been reviewed, patient examined, no change in status, stable for surgery.  I have reviewed the patient's chart and labs.  Questions were answered to the patient's satisfaction.     [employer]

## 2017-01-14 NOTE — Op Note (Signed)
[facility] Patient Name: Jeremy Mclean Procedure Date: 01/14/2017 9:38 AM MRN: [id number] Date of Birth: 09-Feb-1951 Attending MD: Barney Drain MD, MD CSN: [id number] Age: 66 Admit Type: Inpatient Procedure:                Upper GI endoscopy with COLD FORCEPS BIOPSY Indications:              Melena WHILE TAKING IBUPROFEN AND BC POWDERS                            WITHOUT A PPI. Providers:                Barney Drain MD, MD, Lurline Del, RN, Aram Candela Referring MD:             Asencion Noble Medicines:                Meperidine 100 mg IV, Midazolam 6 mg IV Complications:            No immediate complications. Estimated Blood Loss:     Estimated blood loss was minimal. Procedure:                Pre-Anesthesia Assessment:                           - Prior to the procedure, a History and Physical                            was performed, and patient medications and                            allergies were reviewed. The patient's tolerance of                            previous anesthesia was also reviewed. The risks                            and benefits of the procedure and the sedation                            options and risks were discussed with the patient.                            All questions were answered, and informed consent                            was obtained. Prior Anticoagulants: The patient has                            taken ibuprofen, last dose was 1 day prior to                            procedure. ASA Grade Assessment: II - A patient                            with mild systemic disease. After reviewing the  risks and benefits, the patient was deemed in                            satisfactory condition to undergo the procedure.                            After obtaining informed consent, the endoscope was                            passed under direct vision. Throughout the                            procedure, the patient's blood  pressure, pulse, and                            oxygen saturations were monitored continuously. The                            EG-[id number] ([id number]) scope was introduced through the                            mouth, and advanced to the second part of duodenum.                            The upper GI endoscopy was accomplished without                            difficulty. The patient tolerated the procedure                            well. Scope In: 10:31:17 AM Scope Out: 10:36:55 AM Total Procedure Duration: 0 hours 5 minutes 38 seconds  Findings:      One moderate (circumferential scarring or stenosis; an endoscope may       pass) benign-appearing, intrinsic stenosis was found. This measured 1.4       cm (inner diameter) and was traversed.      Diffuse moderate inflammation characterized by congestion (edema) and       erythema was found in the gastric body and in the gastric antrum.       Biopsies were taken with a cold forceps for Helicobacter pylori testing.      One non-bleeding cratered duodenal ulcer with no stigmata of bleeding       was found in the first portion of the duodenum.      Patchy mild inflammation characterized by congestion (edema) and       erythema was found in the first portion of the duodenum.      The second portion of the duodenum was normal. Impression:               - Benign-appearing esophageal STRICTURE                           - MODERATE Gastritis. Biopsied.                           - GI BLEED DUE  TO One non-bleeding duodenal ulcer                            with no stigmata of bleeding.                           - MILD Duodenitis. Moderate Sedation:      Moderate (conscious) sedation was administered by the endoscopy nurse       and supervised by the endoscopist. The following parameters were       monitored: oxygen saturation, heart rate, blood pressure, and response       to care. Total physician intraservice time was 16 minutes. Recommendation:            - Await pathology results.                           - Continue present medications.                           - D/C PROTIONIX GTT. Use Protonix (pantoprazole) 40                            mg PO BID.                           - Low fat diet.                           - Continue present medications.                           - Return patient to hospital ward for ongoing care.                           -DISCUSSED FINDINGS AND RECOMMENDATIONS WITH HIS                            WIFE AND DAUGHTER. Procedure Code(s):        --- Professional ---                           [phone], Esophagogastroduodenoscopy, flexible,                            transoral; with biopsy, single or multiple                           99152, Moderate sedation services provided by the                            same physician or other qualified health care                            professional performing the diagnostic or                            therapeutic service that the sedation supports,  requiring the presence of an independent trained                            observer to assist in the monitoring of the                            patient's level of consciousness and physiological                            status; initial 15 minutes of intraservice time,                            patient age 44 years or older Diagnosis Code(s):        --- Professional ---                           K22.2, Esophageal obstruction                           K29.70, Gastritis, unspecified, without bleeding                           K26.9, Duodenal ulcer, unspecified as acute or                            chronic, without hemorrhage or perforation                           K29.80, Duodenitis without bleeding                           K92.1, Melena (includes Hematochezia) CPT copyright 2016 American Medical Association. All rights reserved. The codes documented in this report are preliminary and  upon coder review may  be revised to meet current compliance requirements. Barney Drain, MD Barney Drain MD, MD 01/14/2017 10:56:40 AM This report has been signed electronically. Number of Addenda: 0

## 2017-01-14 NOTE — Discharge Summary (Signed)
Physician Discharge Summary  Jeremy Mclean BMW:[id number] DOB: Jan 11, 1951 DOA: 01/13/2017  PCP: Asencion Noble, MD  Admit date: 01/13/2017 Discharge date: 01/15/17  Admitted From: Home Disposition:  Home   Recommendations for Outpatient Follow-up:  1. Follow up with PCP in 1-2 weeks 2. Please obtain BMP/CBC in one week     Discharge Condition: Stable CODE STATUS:FULL Diet recommendation: Heart Healthy    Brief/Interim Summary: 66 y.o.malewith medical history ofAAA, essential hypertension, COPD, tobacco abuse, and depression presenting with 5-6 days of melanotic stools. The patient has been taking BC powders on a daily basis for the past 6 months. In addition, the patient has been taking ibuprofen off and on, at least twice per week for the last 6 months. He denies any fevers, chills, chest pain, nausea, vomiting, diarrhea, hematochezia, hematemesis. He has complained of some epigastric burning,shortness of breath and dyspnea on exertion with associated dizziness for the past 4-5 days. Unfortunately, he continues to smoke. He denies any worsening cough or sputum production. In the emergency department, the patient was afebrile hemodynamically stable with 100% oxygen saturation. BMP and LFTs were unremarkable. Hemoglobin was 8.8 with WBC 13.9.His hemoglobin was 14.2 on August 18, 2015. EKG shows sinus rhythm with nonspecific ST-T wave changes. Fecal occult blood test showed melanotic stool without blood.  GI was consulted.  The patient underwent EGD January 14, 2017 which revealed a nonbleeding duodenal ulcer which was likely the source of upper GI bleed.  He was transfused 1 unit PRBC for the admission.  the patient was transitioned to oral Protonix and his diet was advanced.    Discharge Diagnoses:   Acute blood loss anemia/symptomatic anemia -secondary to upper GI bleed -Transfuse one unit PRBC 11/24 -Hgb 8.3 on day of d/c  Melanotic stool/Upper GI bleed -upper GI  bleed secondary to NSAIDs -Continue Protonix drip>>>home with protonix po bid x 3 months, then qday -Clear liquid diet>>>heart healthy -GI consult appreciated -11/24 EGD--benign-appearing esophageal stricture, moderate gastritis, GIB due to nonbleeding duodenal ulcer, mild duodenitis  COPD/tobacco abuse -ContinueBreo -continue Duonebs -Tobacco cessation discussed -NicoDerm patch deferred -stable on RA  Essential hypertension -Holding amlodipine, lisinopril, and chlorthalidone secondary to soft blood pressure -will not restart as SBP remains in 100-110 range -follow up with PCP  Epigastric discomfort/chest discomfort -Cycle troponins--neg x 4 -Chest x-ray--neg  Leukocytosis -Patient is afebrile hemodynamically stable -UA--neg for pyuria -Chest x-ray--neg -Monitor clinically off antibiotics -improved     Discharge Instructions  Discharge Instructions    Diet - low sodium heart healthy   Complete by:  As directed    Increase activity slowly   Complete by:  As directed      Allergies as of 01/15/2017      Reactions   Contrast Media [iodinated Diagnostic Agents] Other (See Comments)   Burning, needs pre meds   Iohexol Hives, Itching, Other (See Comments)   Burning. Needs pre-meds   Prednisone Other (See Comments)   Agitation and hyperactivity      Medication List    STOP taking these medications   amLODipine 10 MG tablet Commonly known as:  NORVASC   chlorthalidone 25 MG tablet Commonly known as:  HYGROTON   ibuprofen 400 MG tablet Commonly known as:  ADVIL,MOTRIN   lisinopril 40 MG tablet Commonly known as:  PRINIVIL,ZESTRIL     TAKE these medications   albuterol 108 (90 Base) MCG/ACT inhaler Commonly known as:  PROVENTIL HFA;VENTOLIN HFA Inhale 1-2 puffs into the lungs every 6 (six) hours as needed for  wheezing or shortness of breath.   BREO ELLIPTA 200-25 MCG/INH Aepb Generic drug:  fluticasone furoate-vilanterol Inhale 1 puff into the  lungs daily.   diazepam 5 MG tablet Commonly known as:  VALIUM Take 5 mg by mouth every 6 (six) hours as needed for anxiety. Anxiety   pantoprazole 40 MG tablet Commonly known as:  PROTONIX Take 1 tablet (40 mg total) by mouth 2 (two) times daily before a meal.   traMADol 50 MG tablet Commonly known as:  ULTRAM Take 50 mg by mouth every 6 (six) hours as needed for moderate pain.       Allergies  Allergen Reactions  . Contrast Media [Iodinated Diagnostic Agents] Other (See Comments)    Burning, needs pre meds  . Iohexol Hives, Itching and Other (See Comments)    Burning. Needs pre-meds   . Prednisone Other (See Comments)    Agitation and hyperactivity    Consultations: GI--Fields  Procedures/Studies: Dg Chest 2 View  Result Date: 01/13/2017 CLINICAL DATA:  66 year old male with shortness of breath on exertion, nonproductive cough, heartburn, midsternal chest pain and fever for 5 days. EXAM: CHEST  2 VIEW COMPARISON:  06/15/2016 FINDINGS: The heart size and mediastinal contours are within normal limits. Both lungs are clear. The visualized skeletal structures are unremarkable. Stable nodular opacity at the left lung base is stable from multiple prior examinations and felt to represent a nipple shadow. IMPRESSION: No active cardiopulmonary disease. Electronically Signed   By: Kristopher Oppenheim M.D.   On: 01/13/2017 18:51   Dg Knee 3 Views Right  Result Date: 01/10/2017 CLINICAL DATA:  Right knee pain after injury last week. EXAM: RIGHT KNEE - 3 VIEW COMPARISON:  None. FINDINGS: No evidence of fracture, dislocation, or joint effusion. No evidence of arthropathy. Mild patellar spurring is noted. Soft tissues are unremarkable. IMPRESSION: No significant abnormality seen in the right knee. Electronically Signed   By: Marijo Conception, M.D.   On: 01/10/2017 09:42        Discharge Exam: Vitals:   01/15/17 0500 01/15/17 0813  BP: 105/62   Pulse: 81   Resp: 18   Temp: 98.7 F  (37.1 C)   SpO2: 98% 98%   Vitals:   01/14/17 2031 01/14/17 2038 01/15/17 0500 01/15/17 0813  BP: (!) 102/54  105/62   Pulse: 81 78 81   Resp: 16 18 18    Temp: (!) 100.5 F (38.1 C)  98.7 F (37.1 C)   TempSrc: Oral  Oral   SpO2: 99% 98% 98% 98%  Weight:      Height:        General: Pt is alert, awake, not in acute distress Cardiovascular: RRR, S1/S2 +, no rubs, no gallops Respiratory: CTA bilaterally, no wheezing, no rhonchi Abdominal: Soft, NT, ND, bowel sounds + Extremities: no edema, no cyanosis   The results of significant diagnostics from this hospitalization (including imaging, microbiology, ancillary and laboratory) are listed below for reference.    Significant Diagnostic Studies: Dg Chest 2 View  Result Date: 01/13/2017 CLINICAL DATA:  66 year old male with shortness of breath on exertion, nonproductive cough, heartburn, midsternal chest pain and fever for 5 days. EXAM: CHEST  2 VIEW COMPARISON:  06/15/2016 FINDINGS: The heart size and mediastinal contours are within normal limits. Both lungs are clear. The visualized skeletal structures are unremarkable. Stable nodular opacity at the left lung base is stable from multiple prior examinations and felt to represent a nipple shadow. IMPRESSION: No active cardiopulmonary disease. Electronically Signed  By: Kristopher Oppenheim M.D.   On: 01/13/2017 18:51   Dg Knee 3 Views Right  Result Date: 01/10/2017 CLINICAL DATA:  Right knee pain after injury last week. EXAM: RIGHT KNEE - 3 VIEW COMPARISON:  None. FINDINGS: No evidence of fracture, dislocation, or joint effusion. No evidence of arthropathy. Mild patellar spurring is noted. Soft tissues are unremarkable. IMPRESSION: No significant abnormality seen in the right knee. Electronically Signed   By: Marijo Conception, M.D.   On: 01/10/2017 09:42     Microbiology: Recent Results (from the past 240 hour(s))  Culture, Urine     Status: None   Collection Time: 01/13/17  6:00 PM    Result Value Ref Range Status   Specimen Description URINE, CLEAN CATCH  Final   Special Requests NONE  Final   Culture   Final    NO GROWTH Performed at [facility], [place] [address] [place], [place] [phone]    Report Status 01/15/2017 FINAL  Final     Labs: Basic Metabolic Panel: Recent Labs  Lab 01/13/17 1015  NA 136  K 4.0  CL 105  CO2 25  GLUCOSE 111*  BUN 36*  CREATININE 1.10  CALCIUM 9.0   Liver Function Tests: Recent Labs  Lab 01/13/17 1015  AST 24  ALT 17  ALKPHOS 40  BILITOT 0.4  PROT 6.1*  ALBUMIN 3.5   No results for input(s): LIPASE, AMYLASE in the last 168 hours. No results for input(s): AMMONIA in the last 168 hours. CBC: Recent Labs  Lab 01/13/17 1015 01/14/17 0446 01/15/17 0711  WBC 13.9* 9.8 10.5  NEUTROABS 8.3*  --   --   HGB 8.8* 7.1* 8.3*  HCT 26.5* 22.1* 25.7*  MCV 98.9 100.0 98.5  PLT 255 229 269   Cardiac Enzymes: Recent Labs  Lab 01/13/17 1015 01/13/17 1558 01/13/17 2228 01/14/17 0446  TROPONINI <0.03 <0.03 <0.03 <0.03   BNP: Invalid input(s): POCBNP CBG: No results for input(s): GLUCAP in the last 168 hours.  Time coordinating discharge:  Greater than 30 minutes  Signed:  Lutisha Knoche, DO [facility] Pager: [phone] 01/15/2017, 12:45 PM

## 2017-01-14 NOTE — Procedures (Deleted)
Surgeon: Barney Drain  ASA Class: 3  INDICATION: ANEMIA. ATTEMPTED TO PREP PT FOR 2 DAYS WITH MIRALAX.  PRE-OPERATIVE DIAGNOSIS: ANEMIA POST-OPERATIVE DIAGNOSIS:  ANEMIA  PROCEDURE:  Procedure(s): ANOSCOPY, INCOMPLETE COLONOSCOPY  SURGEON:  Surgeon(s): Dorothyann Peng, MD  MEDS:  FENTANYL 50 MCG IV VERSED 3 MG IV  PLAN OF CARE:  PROCEED TO EGD   Procedure technique: Pt  prepped and draped. MEDS REVIEWED. COLONOSCOPE INSERTED. FORMED STOOL IN RECTUM AND UNABLE TO PASS SCOPE BEYOND 20 CM. SCOPE WITHDRAWN. Pt tolerated procedure well.

## 2017-01-14 NOTE — Progress Notes (Signed)
PROGRESS NOTE  Jeremy Mclean DDU:[id number] DOB: Sep 04, 1950 DOA: 01/13/2017 PCP: Asencion Noble, MD  Brief History:   66 y.o. male with medical history of AAA, essential hypertension, COPD, tobacco abuse, and depression presenting with 5-6 days of melanotic stools.  The patient has been taking BC powders on a daily basis for the past 6 months.  In addition, the patient has been taking ibuprofen off and on, at least twice per week for the last 6 months.  He denies any fevers, chills, chest pain, nausea, vomiting, diarrhea, hematochezia, hematemesis.  He has complained of some epigastric burning, shortness of breath and dyspnea on exertion with associated dizziness for the past 4-5 days.  Unfortunately, he continues to smoke.  He denies any worsening cough or sputum production. In the emergency department, the patient was afebrile hemodynamically stable with 100% oxygen saturation.  BMP and LFTs were unremarkable.  Hemoglobin was 8.8 with WBC 13.9.  His hemoglobin was 14.2 on August 18, 2015.  EKG shows sinus rhythm with nonspecific ST-T wave changes.  Fecal occult blood test showed melanotic stool without blood    Assessment/Plan: Acute blood loss anemia/symptomatic anemia -secondary to upper GI bleed -Transfuse one unit PRBC 11/24  Melanotic stool/Upper GI bleed -upper GI bleed secondary to NSAIDs -Continue Protonix drip -Clear liquid diet>>>heart healthy -GI consult appreciated -11/24 EGD--benign-appearing esophageal stricture, moderate gastritis, GIB due to nonbleeding duodenal ulcer, mild duodenitis  COPD/tobacco abuse -Continue Breo -continue Duonebs -Tobacco cessation discussed -NicoDerm patch deferred -stable on RA  Essential hypertension -Holding amlodipine, lisinopril, and chlorthalidone secondary to soft blood pressure  Epigastric discomfort/chest discomfort -Cycle troponins--neg x 4 -Chest x-ray--neg  Leukocytosis -Patient is afebrile hemodynamically  stable -UA--neg for pyuria -Chest x-ray--neg -Monitor clinically off antibiotics -improved  Disposition Plan:   Home 11/25 if cleared by GI Family Communication:   Spouse updated at bedside 11/24  Consultants:  GI--Fields  Code Status:  FULL  DVT Prophylaxis:  SCDs   Procedures: As Listed in Progress Note Above  Antibiotics: None    Subjective: Patient denies fevers, chills, headache, chest pain, dyspnea, nausea, vomiting, diarrhea, abdominal pain, dysuria, hematuria, hematochezia, and melena.'  Objective: Vitals:   01/14/17 1110 01/14/17 1433 01/14/17 1445 01/14/17 1520  BP:   (!) 108/56 (!) 98/46  Pulse: 69  78 79  Resp: 13  14 18   Temp:   98 F (36.7 C) 98.9 F (37.2 C)  TempSrc:   Oral Oral  SpO2: 100% 98% 100%   Weight:      Height:        Intake/Output Summary (Last 24 hours) at 01/14/2017 1701 Last data filed at 01/14/2017 1300 Gross per 24 hour  Intake 2142.5 ml  Output 850 ml  Net 1292.5 ml   Weight change:  Exam:   General:  Pt is alert, follows commands appropriately, not in acute distress  HEENT: No icterus, No thrush, No neck mass, Outlook/AT  Cardiovascular: RRR, S1/S2, no rubs, no gallops  Respiratory: Diminished breath sounds at the bases.  Bibasilar rales.  No wheezing.  Abdomen: Soft/+BS, non tender, non distended, no guarding  Extremities: No edema, No lymphangitis, No petechiae, No rashes, no synovitis   Data Reviewed: I have personally reviewed following labs and imaging studies Basic Metabolic Panel: Recent Labs  Lab 01/13/17 1015  NA 136  K 4.0  CL 105  CO2 25  GLUCOSE 111*  BUN 36*  CREATININE 1.10  CALCIUM 9.0   Liver  Function Tests: Recent Labs  Lab 01/13/17 1015  AST 24  ALT 17  ALKPHOS 40  BILITOT 0.4  PROT 6.1*  ALBUMIN 3.5   No results for input(s): LIPASE, AMYLASE in the last 168 hours. No results for input(s): AMMONIA in the last 168 hours. Coagulation Profile: Recent Labs  Lab 01/14/17 0446   INR 1.09   CBC: Recent Labs  Lab 01/13/17 1015 01/14/17 0446  WBC 13.9* 9.8  NEUTROABS 8.3*  --   HGB 8.8* 7.1*  HCT 26.5* 22.1*  MCV 98.9 100.0  PLT 255 229   Cardiac Enzymes: Recent Labs  Lab 01/13/17 1015 01/13/17 1558 01/13/17 2228 01/14/17 0446  TROPONINI <0.03 <0.03 <0.03 <0.03   BNP: Invalid input(s): POCBNP CBG: No results for input(s): GLUCAP in the last 168 hours. HbA1C: No results for input(s): HGBA1C in the last 72 hours. Urine analysis:    Component Value Date/Time   COLORURINE YELLOW 01/13/2017 1810   APPEARANCEUR HAZY (A) 01/13/2017 1810   LABSPEC 1.021 01/13/2017 1810   PHURINE 5.0 01/13/2017 1810   GLUCOSEU NEGATIVE 01/13/2017 1810   HGBUR NEGATIVE 01/13/2017 1810   BILIRUBINUR NEGATIVE 01/13/2017 1810   KETONESUR NEGATIVE 01/13/2017 1810   PROTEINUR NEGATIVE 01/13/2017 1810   UROBILINOGEN 0.2 04/09/2013 1215   NITRITE NEGATIVE 01/13/2017 1810   LEUKOCYTESUR NEGATIVE 01/13/2017 1810   Sepsis Labs: @LABRCNTIP (procalcitonin:4,lacticidven:4) )No results found for this or any previous visit (from the past 240 hour(s)).   Scheduled Meds: . fluticasone furoate-vilanterol  1 puff Inhalation Daily  . ipratropium-albuterol  3 mL Nebulization TID  . lidocaine      . meperidine      . midazolam      . [START ON 01/15/2017] pantoprazole  40 mg Oral BID AC   Continuous Infusions:  Procedures/Studies: Dg Chest 2 View  Result Date: 01/13/2017 CLINICAL DATA:  66 year old male with shortness of breath on exertion, nonproductive cough, heartburn, midsternal chest pain and fever for 5 days. EXAM: CHEST  2 VIEW COMPARISON:  06/15/2016 FINDINGS: The heart size and mediastinal contours are within normal limits. Both lungs are clear. The visualized skeletal structures are unremarkable. Stable nodular opacity at the left lung base is stable from multiple prior examinations and felt to represent a nipple shadow. IMPRESSION: No active cardiopulmonary disease.  Electronically Signed   By: Kristopher Oppenheim M.D.   On: 01/13/2017 18:51   Dg Knee 3 Views Right  Result Date: 01/10/2017 CLINICAL DATA:  Right knee pain after injury last week. EXAM: RIGHT KNEE - 3 VIEW COMPARISON:  None. FINDINGS: No evidence of fracture, dislocation, or joint effusion. No evidence of arthropathy. Mild patellar spurring is noted. Soft tissues are unremarkable. IMPRESSION: No significant abnormality seen in the right knee. Electronically Signed   By: Marijo Conception, M.D.   On: 01/10/2017 09:42    Laurence Folz, DO  [facility] Pager [phone]  If 7PM-7AM, please contact night-coverage [email or web address] Password [email or web address] 01/14/2017, 5:01 PM   LOS: 1 day

## 2017-01-15 LAB — TYPE AND SCREEN
ABO/RH(D): O NEG
ANTIBODY SCREEN: NEGATIVE
Unit division: 0

## 2017-01-15 LAB — BPAM RBC
BLOOD PRODUCT EXPIRATION DATE: 201812152359
ISSUE DATE / TIME: 201811241444
UNIT TYPE AND RH: 9500

## 2017-01-15 LAB — CBC
HCT: 25.7 % — ABNORMAL LOW (ref 39.0–52.0)
Hemoglobin: 8.3 g/dL — ABNORMAL LOW (ref 13.0–17.0)
MCH: 31.8 pg (ref 26.0–34.0)
MCHC: 32.3 g/dL (ref 30.0–36.0)
MCV: 98.5 fL (ref 78.0–100.0)
PLATELETS: 269 10*3/uL (ref 150–400)
RBC: 2.61 MIL/uL — ABNORMAL LOW (ref 4.22–5.81)
RDW: 15.5 % (ref 11.5–15.5)
WBC: 10.5 10*3/uL (ref 4.0–10.5)

## 2017-01-15 LAB — URINE CULTURE: CULTURE: NO GROWTH

## 2017-01-15 MED ORDER — PANTOPRAZOLE SODIUM 40 MG PO TBEC: 40.0000 mg | DELAYED_RELEASE_TABLET | Freq: Two times a day (BID) | ORAL | 2 refills | Status: AC

## 2017-01-15 NOTE — Progress Notes (Signed)
Patient ID: Jeremy Mclean, male   DOB: 10-25-1950, 66 y.o.   MRN: [id number]   Assessment/Plan: ADMITTED WITH UGIB W/ MELENA DUE TO PUD WHILE TAKING BC POWDERS AND IBUPROFEN. CLINICALLY IMPROVED.   PLAN: 1. BID PROTONIX FOR 3 MOS THE ONCE DAILY 2. D/C HOME. OPV IN 4 MOS W/ SLF 3. WILL CALL WITH PATHOLOGY RESULTS   Subjective: Since I last evaluated the patient HE HAD AN EGD. TOLERATING POS. NO BRBPR OR MELENA.   Objective: Vital signs in last 24 hours: Vitals:   01/15/17 0500 01/15/17 0813  BP: 105/62   Pulse: 81   Resp: 18   Temp: 98.7 F (37.1 C)   SpO2: 98% 98%   General appearance: alert, cooperative and no distress Resp: clear to auscultation bilaterally Cardio: regular rate and rhythm GI: soft, non-tender; bowel sounds normal;   Lab Results: Hb 8.3    Studies/Results: No results found.  Medications: I have reviewed the patient's current medications.   LOS: 5 days   [employer]

## 2017-01-15 NOTE — Plan of Care (Signed)
Pt is progressing 

## 2017-01-15 NOTE — Progress Notes (Signed)
Removed both IVs-clean, dry, and intact. Reviewed d/c paperwork with the pt and wife. Reviewed home meds and followup appts.  Answered all questions. Wheeled stable pt and belongings  to main entrance where he got in the car with his wife. Marland Kitchen

## 2017-01-16 ENCOUNTER — Encounter (HOSPITAL_COMMUNITY): Payer: Self-pay | Admitting: Gastroenterology

## 2017-01-18 ENCOUNTER — Telehealth: Payer: Self-pay | Admitting: Gastroenterology

## 2017-01-18 NOTE — Telephone Encounter (Signed)
Reminder in epic °

## 2017-01-18 NOTE — Telephone Encounter (Signed)
Please call pt. HIS stomach Bx shows gastritis/ulcers due to ASA products and ibuprofen.   AVOID ASA AND IBUPROFEN. PROTONIX BID FOR 3 MOS THEN ONCE DAILY. FOLLOW UP IN 6 MOS WITH DR. FIELDS E30 PUD.

## 2017-01-19 NOTE — Telephone Encounter (Signed)
LMOM to call. Mailing a letter also.

## 2017-03-21 DIAGNOSIS — F419 Anxiety disorder, unspecified: Secondary | ICD-10-CM | POA: Diagnosis not present

## 2017-03-21 DIAGNOSIS — I10 Essential (primary) hypertension: Secondary | ICD-10-CM | POA: Diagnosis not present

## 2017-03-21 DIAGNOSIS — E785 Hyperlipidemia, unspecified: Secondary | ICD-10-CM | POA: Diagnosis not present

## 2017-03-21 DIAGNOSIS — K219 Gastro-esophageal reflux disease without esophagitis: Secondary | ICD-10-CM | POA: Diagnosis not present

## 2017-03-21 DIAGNOSIS — Z79899 Other long term (current) drug therapy: Secondary | ICD-10-CM | POA: Diagnosis not present

## 2017-03-21 DIAGNOSIS — Z125 Encounter for screening for malignant neoplasm of prostate: Secondary | ICD-10-CM | POA: Diagnosis not present

## 2017-03-24 DIAGNOSIS — Z6835 Body mass index (BMI) 35.0-35.9, adult: Secondary | ICD-10-CM | POA: Diagnosis not present

## 2017-03-24 DIAGNOSIS — K269 Duodenal ulcer, unspecified as acute or chronic, without hemorrhage or perforation: Secondary | ICD-10-CM | POA: Diagnosis not present

## 2017-03-24 DIAGNOSIS — D649 Anemia, unspecified: Secondary | ICD-10-CM | POA: Diagnosis not present

## 2017-03-24 DIAGNOSIS — I714 Abdominal aortic aneurysm, without rupture: Secondary | ICD-10-CM | POA: Diagnosis not present

## 2017-03-31 DIAGNOSIS — R262 Difficulty in walking, not elsewhere classified: Secondary | ICD-10-CM | POA: Diagnosis not present

## 2017-03-31 DIAGNOSIS — R270 Ataxia, unspecified: Secondary | ICD-10-CM | POA: Diagnosis not present

## 2017-03-31 DIAGNOSIS — M6281 Muscle weakness (generalized): Secondary | ICD-10-CM | POA: Diagnosis not present

## 2017-03-31 DIAGNOSIS — R296 Repeated falls: Secondary | ICD-10-CM | POA: Diagnosis not present

## 2017-04-03 DIAGNOSIS — M6281 Muscle weakness (generalized): Secondary | ICD-10-CM | POA: Diagnosis not present

## 2017-04-03 DIAGNOSIS — R296 Repeated falls: Secondary | ICD-10-CM | POA: Diagnosis not present

## 2017-04-03 DIAGNOSIS — R262 Difficulty in walking, not elsewhere classified: Secondary | ICD-10-CM | POA: Diagnosis not present

## 2017-04-03 DIAGNOSIS — R270 Ataxia, unspecified: Secondary | ICD-10-CM | POA: Diagnosis not present

## 2017-04-07 DIAGNOSIS — R296 Repeated falls: Secondary | ICD-10-CM | POA: Diagnosis not present

## 2017-04-07 DIAGNOSIS — R262 Difficulty in walking, not elsewhere classified: Secondary | ICD-10-CM | POA: Diagnosis not present

## 2017-04-07 DIAGNOSIS — M6281 Muscle weakness (generalized): Secondary | ICD-10-CM | POA: Diagnosis not present

## 2017-04-07 DIAGNOSIS — R270 Ataxia, unspecified: Secondary | ICD-10-CM | POA: Diagnosis not present

## 2017-04-10 DIAGNOSIS — R296 Repeated falls: Secondary | ICD-10-CM | POA: Diagnosis not present

## 2017-04-10 DIAGNOSIS — R262 Difficulty in walking, not elsewhere classified: Secondary | ICD-10-CM | POA: Diagnosis not present

## 2017-04-10 DIAGNOSIS — M6281 Muscle weakness (generalized): Secondary | ICD-10-CM | POA: Diagnosis not present

## 2017-04-10 DIAGNOSIS — R270 Ataxia, unspecified: Secondary | ICD-10-CM | POA: Diagnosis not present

## 2017-04-12 DIAGNOSIS — R296 Repeated falls: Secondary | ICD-10-CM | POA: Diagnosis not present

## 2017-04-12 DIAGNOSIS — M6281 Muscle weakness (generalized): Secondary | ICD-10-CM | POA: Diagnosis not present

## 2017-04-12 DIAGNOSIS — R270 Ataxia, unspecified: Secondary | ICD-10-CM | POA: Diagnosis not present

## 2017-04-12 DIAGNOSIS — R262 Difficulty in walking, not elsewhere classified: Secondary | ICD-10-CM | POA: Diagnosis not present

## 2017-04-17 DIAGNOSIS — R296 Repeated falls: Secondary | ICD-10-CM | POA: Diagnosis not present

## 2017-04-17 DIAGNOSIS — R270 Ataxia, unspecified: Secondary | ICD-10-CM | POA: Diagnosis not present

## 2017-04-17 DIAGNOSIS — R262 Difficulty in walking, not elsewhere classified: Secondary | ICD-10-CM | POA: Diagnosis not present

## 2017-04-17 DIAGNOSIS — M6281 Muscle weakness (generalized): Secondary | ICD-10-CM | POA: Diagnosis not present

## 2017-04-19 DIAGNOSIS — R296 Repeated falls: Secondary | ICD-10-CM | POA: Diagnosis not present

## 2017-04-19 DIAGNOSIS — R270 Ataxia, unspecified: Secondary | ICD-10-CM | POA: Diagnosis not present

## 2017-04-19 DIAGNOSIS — M6281 Muscle weakness (generalized): Secondary | ICD-10-CM | POA: Diagnosis not present

## 2017-04-19 DIAGNOSIS — R262 Difficulty in walking, not elsewhere classified: Secondary | ICD-10-CM | POA: Diagnosis not present

## 2017-04-24 DIAGNOSIS — R270 Ataxia, unspecified: Secondary | ICD-10-CM | POA: Diagnosis not present

## 2017-04-24 DIAGNOSIS — R262 Difficulty in walking, not elsewhere classified: Secondary | ICD-10-CM | POA: Diagnosis not present

## 2017-04-24 DIAGNOSIS — R296 Repeated falls: Secondary | ICD-10-CM | POA: Diagnosis not present

## 2017-04-24 DIAGNOSIS — M6281 Muscle weakness (generalized): Secondary | ICD-10-CM | POA: Diagnosis not present

## 2017-04-26 DIAGNOSIS — R262 Difficulty in walking, not elsewhere classified: Secondary | ICD-10-CM | POA: Diagnosis not present

## 2017-04-26 DIAGNOSIS — R270 Ataxia, unspecified: Secondary | ICD-10-CM | POA: Diagnosis not present

## 2017-04-26 DIAGNOSIS — M6281 Muscle weakness (generalized): Secondary | ICD-10-CM | POA: Diagnosis not present

## 2017-04-26 DIAGNOSIS — R296 Repeated falls: Secondary | ICD-10-CM | POA: Diagnosis not present

## 2017-05-01 DIAGNOSIS — M6281 Muscle weakness (generalized): Secondary | ICD-10-CM | POA: Diagnosis not present

## 2017-05-01 DIAGNOSIS — R262 Difficulty in walking, not elsewhere classified: Secondary | ICD-10-CM | POA: Diagnosis not present

## 2017-05-01 DIAGNOSIS — R296 Repeated falls: Secondary | ICD-10-CM | POA: Diagnosis not present

## 2017-05-01 DIAGNOSIS — R270 Ataxia, unspecified: Secondary | ICD-10-CM | POA: Diagnosis not present

## 2017-05-03 DIAGNOSIS — R262 Difficulty in walking, not elsewhere classified: Secondary | ICD-10-CM | POA: Diagnosis not present

## 2017-05-03 DIAGNOSIS — M6281 Muscle weakness (generalized): Secondary | ICD-10-CM | POA: Diagnosis not present

## 2017-05-03 DIAGNOSIS — R270 Ataxia, unspecified: Secondary | ICD-10-CM | POA: Diagnosis not present

## 2017-05-03 DIAGNOSIS — R296 Repeated falls: Secondary | ICD-10-CM | POA: Diagnosis not present

## 2017-05-08 DIAGNOSIS — M6281 Muscle weakness (generalized): Secondary | ICD-10-CM | POA: Diagnosis not present

## 2017-05-08 DIAGNOSIS — R262 Difficulty in walking, not elsewhere classified: Secondary | ICD-10-CM | POA: Diagnosis not present

## 2017-05-08 DIAGNOSIS — R270 Ataxia, unspecified: Secondary | ICD-10-CM | POA: Diagnosis not present

## 2017-05-08 DIAGNOSIS — R296 Repeated falls: Secondary | ICD-10-CM | POA: Diagnosis not present

## 2017-05-11 NOTE — Progress Notes (Signed)
Established Abdominal Aortic Aneurysm   History of Present Illness   Jeremy Mclean is a 67 y.o. ([birth year]) male who presents with chief complaint: follow up for AAA.  Previous studies demonstrate an AAA: 4.1 cm x 41 cm. R CIA aneurysm: 3.7 cm x 3.7 cm.  L CIA aneurysm was 2.0 cm x 1.8 cm. On prior CTA, the R CIA appears to be only 3.0 cm.  The patient does NOThave back or abdominal pain current. The patient is current an active smoker.  The patient's PMH, PSH, SH, and FamHx were reviewed on 05/17/17 are unchanged from 11/11/16.  Current Outpatient Medications  Medication Sig Dispense Refill  . albuterol (PROVENTIL HFA;VENTOLIN HFA) 108 (90 Base) MCG/ACT inhaler Inhale 1-2 puffs into the lungs every 6 (six) hours as needed for wheezing or shortness of breath.    Marland Kitchen BREO ELLIPTA 200-25 MCG/INH AEPB Inhale 1 puff into the lungs daily.  12  . diazepam (VALIUM) 5 MG tablet Take 5 mg by mouth every 6 (six) hours as needed for anxiety. Anxiety    . pantoprazole (PROTONIX) 40 MG tablet Take 1 tablet (40 mg total) by mouth 2 (two) times daily before a meal. 60 tablet 2  . traMADol (ULTRAM) 50 MG tablet Take 50 mg by mouth every 6 (six) hours as needed for moderate pain.      No current facility-administered medications for this visit.    Facility-Administered Medications Ordered in Other Visits  Medication Dose Route Frequency Provider Last Rate Last Dose  . rabies immune globulin (HYPERAB) injection 1,720 Units  1,720 Units Intramuscular Once Jennye Boroughs M, PA-C      . rabies vaccine (RABAVERT/IMOVAX) injection 1 mL  1 mL Intramuscular Once Duaine Dredge, PA-C        On ROS today: no back or abd pain, no embolic sx, no intermittent claudication    Physical Examination   Vitals:   05/17/17 0845  BP: (!) 132/93  Pulse: 60  Resp: 18  Temp: (!) 97 F (36.1 C)  TempSrc: Oral  SpO2: 98%  Weight: 204 lb (92.5 kg)  Height: 5\' 7"  (1.702 m)   Body mass index is 31.95  kg/m.  General Alert, O x 3, WD, NAD  Pulmonary Sym exp, good B air movt, CTA B  Cardiac RRR, Nl S1, S2, no Murmurs, No rubs, No S3,S4  Vascular Vessel Right Left  Radial Palpable Palpable  Brachial Palpable Palpable  Carotid Palpable, No Bruit Palpable, No Bruit  Aorta Not palpable N/A  Femoral Palpable Palpable  Popliteal Not palpable Not palpable  PT Faintly palpable Faintly palpable  DP Faintly palpable Faintly palpable    Gastro- intestinal soft, non-distended, non-tender to palpation, No guarding or rebound, no HSM, no masses, no CVAT B, No palpable prominent aortic pulse,    Musculo- skeletal M/S 5/5 throughout  , Extremities without ischemic changes  , No edema present, No visible varicosities , No Lipodermatosclerosis present  Neurologic Pain and light touch intact in extremities , Motor exam as listed above    Non-Invasive Vascular Imaging   AAA Duplex (05/17/2017)  Current size: 4.3 cm x 4.4 cm (4.1 cm x 4.1 cm)  R CIA: 1.6 cm x 2.1 cm (3.7 cm x 3.7 cm)  L CIA: 1.1 cm x 1.0 cm (2.0 cm x 1.8 cm)   Medical Decision Making   Jeremy Mclean is a 67 y.o. (January 23, 1951) male who presents with: asymptomatic small AAA with previously left small CIA  aneurysm and right moderate-large CIA aneurysm   AAA findings in regards to B CIA aneurysms are so far off from last time that I think repeat CTA abd/pelvis is indicated.  Will obtain this exam and have pt follow up in the next 2-4 weeks.  Thank you for allowing [place] to participate in this patient's care.   Adele Barthel, MD, FACS [facility] [place] Office: [phone] Pager: [phone]

## 2017-05-17 ENCOUNTER — Other Ambulatory Visit: Payer: Self-pay

## 2017-05-17 ENCOUNTER — Ambulatory Visit (HOSPITAL_COMMUNITY)
Admission: RE | Admit: 2017-05-17 | Discharge: 2017-05-17 | Disposition: A | Discharge status: Home / Self Care | Payer: Medicare HMO | Source: Ambulatory Visit | Attending: Vascular Surgery | Admitting: Vascular Surgery

## 2017-05-17 ENCOUNTER — Encounter: Payer: Self-pay | Admitting: Gastroenterology

## 2017-05-17 ENCOUNTER — Ambulatory Visit (INDEPENDENT_AMBULATORY_CARE_PROVIDER_SITE_OTHER): Payer: Medicare HMO | Admitting: Vascular Surgery

## 2017-05-17 ENCOUNTER — Encounter: Payer: Self-pay | Admitting: Vascular Surgery

## 2017-05-17 VITALS — BP 132/93 | HR 60 | Temp 97.0°F | Resp 18 | Ht 67.0 in | Wt 204.0 lb

## 2017-05-17 DIAGNOSIS — I723 Aneurysm of iliac artery: Secondary | ICD-10-CM

## 2017-05-17 DIAGNOSIS — I714 Abdominal aortic aneurysm, without rupture, unspecified: Secondary | ICD-10-CM

## 2017-05-18 ENCOUNTER — Other Ambulatory Visit: Payer: Self-pay

## 2017-05-18 DIAGNOSIS — I714 Abdominal aortic aneurysm, without rupture, unspecified: Secondary | ICD-10-CM

## 2017-05-22 ENCOUNTER — Other Ambulatory Visit: Payer: Self-pay

## 2017-05-22 DIAGNOSIS — I714 Abdominal aortic aneurysm, without rupture, unspecified: Secondary | ICD-10-CM

## 2017-05-22 DIAGNOSIS — Z01818 Encounter for other preprocedural examination: Secondary | ICD-10-CM

## 2017-06-19 ENCOUNTER — Ambulatory Visit (HOSPITAL_COMMUNITY)
Admission: RE | Admit: 2017-06-19 | Discharge: 2017-06-19 | Disposition: A | Discharge status: Home / Self Care | Payer: Medicare HMO | Source: Ambulatory Visit | Attending: Vascular Surgery | Admitting: Vascular Surgery

## 2017-06-19 DIAGNOSIS — I714 Abdominal aortic aneurysm, without rupture, unspecified: Secondary | ICD-10-CM

## 2017-06-20 ENCOUNTER — Other Ambulatory Visit: Payer: Self-pay

## 2017-06-20 MED ORDER — PREDNISONE 50 MG PO TABS: ORAL_TABLET | ORAL | 0 refills | Status: DC

## 2017-06-20 MED ORDER — DIPHENHYDRAMINE HCL 50 MG PO CAPS: ORAL_CAPSULE | ORAL | 0 refills | Status: DC

## 2017-06-21 ENCOUNTER — Other Ambulatory Visit: Payer: Self-pay

## 2017-06-21 ENCOUNTER — Ambulatory Visit: Payer: Medicare Other | Admitting: Vascular Surgery

## 2017-06-21 MED ORDER — PREDNISONE 50 MG PO TABS: ORAL_TABLET | ORAL | 0 refills | Status: DC

## 2017-06-21 MED ORDER — DIPHENHYDRAMINE HCL 50 MG PO CAPS: ORAL_CAPSULE | ORAL | 0 refills | Status: DC

## 2017-06-21 NOTE — Progress Notes (Signed)
Called patient 5.1.2019 @ 1000am to inform him of appointment time and to pick up his premeds at Pharmacy. Told patient to call Ebony Hail at Dr. Lianne Moris office if he has any questions about prescription.

## 2017-06-26 ENCOUNTER — Ambulatory Visit (HOSPITAL_COMMUNITY)
Admission: RE | Admit: 2017-06-26 | Discharge: 2017-06-26 | Disposition: A | Discharge status: Home / Self Care | Payer: Medicare HMO | Source: Ambulatory Visit | Attending: Vascular Surgery | Admitting: Vascular Surgery

## 2017-07-04 ENCOUNTER — Ambulatory Visit (HOSPITAL_COMMUNITY)
Admission: RE | Admit: 2017-07-04 | Discharge: 2017-07-04 | Disposition: A | Discharge status: Home / Self Care | Payer: Medicare HMO | Source: Ambulatory Visit | Attending: Vascular Surgery | Admitting: Vascular Surgery

## 2017-07-04 DIAGNOSIS — I714 Abdominal aortic aneurysm, without rupture: Secondary | ICD-10-CM | POA: Diagnosis not present

## 2017-07-04 LAB — POCT I-STAT CREATININE: CREATININE: 1.2 mg/dL (ref 0.61–1.24)

## 2017-07-04 MED ORDER — IOPAMIDOL (ISOVUE-370) INJECTION 76%
100.0000 mL | Freq: Once | INTRAVENOUS | Status: AC | PRN
  Administered 2017-07-04: 100 mL via INTRAVENOUS

## 2017-07-07 DIAGNOSIS — I714 Abdominal aortic aneurysm, without rupture: Secondary | ICD-10-CM | POA: Diagnosis not present

## 2017-07-07 DIAGNOSIS — I10 Essential (primary) hypertension: Secondary | ICD-10-CM | POA: Diagnosis not present

## 2017-07-12 ENCOUNTER — Other Ambulatory Visit: Payer: Self-pay

## 2017-07-12 ENCOUNTER — Ambulatory Visit (INDEPENDENT_AMBULATORY_CARE_PROVIDER_SITE_OTHER): Payer: Medicare HMO | Admitting: Vascular Surgery

## 2017-07-12 ENCOUNTER — Encounter: Payer: Self-pay | Admitting: Vascular Surgery

## 2017-07-12 VITALS — BP 122/84 | HR 68 | Resp 18 | Ht 67.0 in | Wt 203.0 lb

## 2017-07-12 DIAGNOSIS — I714 Abdominal aortic aneurysm, without rupture, unspecified: Secondary | ICD-10-CM

## 2017-07-12 DIAGNOSIS — I723 Aneurysm of iliac artery: Secondary | ICD-10-CM | POA: Diagnosis not present

## 2017-07-12 NOTE — Progress Notes (Signed)
Established Abdominal Aortic Aneurysm   History of Present Illness   Jeremy Mclean is a 67 y.o. (1950/08/07) male who presents with chief complaint: follow up for AAA.  Previous studies demonstrate an AAA, measuring 4.1 cm x 4.1 cm.  Recent AAA duplex there was a marked change in R CIA diameter so I felt CTA abd/pelvis was necessary.  The patient is currently asx without any back or abd pain.  He also has had no evidence of embolic disease.  The patient's PMH, PSH, SH, and FamHx were reviewed on 07/12/17 are unchanged from 05/17/17.  Current Outpatient Medications  Medication Sig Dispense Refill  . albuterol (PROVENTIL HFA;VENTOLIN HFA) 108 (90 Base) MCG/ACT inhaler Inhale 1-2 puffs into the lungs every 6 (six) hours as needed for wheezing or shortness of breath.    Marland Kitchen BREO ELLIPTA 200-25 MCG/INH AEPB Inhale 1 puff into the lungs daily.  12  . diazepam (VALIUM) 5 MG tablet Take 5 mg by mouth every 6 (six) hours as needed for anxiety. Anxiety    . diphenhydrAMINE (BENADRYL) 50 MG capsule Take 50 mg by mouth 1 hour prior to your procedure. 1 capsule 0  . pantoprazole (PROTONIX) 40 MG tablet Take 1 tablet (40 mg total) by mouth 2 (two) times daily before a meal. 60 tablet 2  . predniSONE (DELTASONE) 50 MG tablet One tablet (50mg ) 13 hours prior to procedure; one tablet (50mg ) 7 hours prior to procedure and then one tablet (50 mg) one hour prior to procedure. 3 tablet 0  . traMADol (ULTRAM) 50 MG tablet Take 50 mg by mouth every 6 (six) hours as needed for moderate pain.      No current facility-administered medications for this visit.    Facility-Administered Medications Ordered in Other Visits  Medication Dose Route Frequency Provider Last Rate Last Dose  . rabies immune globulin (HYPERAB) injection 1,720 Units  1,720 Units Intramuscular Once Jennye Boroughs M, PA-C      . rabies vaccine (RABAVERT/IMOVAX) injection 1 mL  1 mL Intramuscular Once Duaine Dredge, PA-C        On ROS  today: no intermittent claudication , left arm felt related to nerve compression s/p C-spine surgery  Physical Examination   Vitals:   07/12/17 1549  BP: 122/84  Pulse: 68  Resp: 18  SpO2: 95%  Weight: 203 lb (92.1 kg)  Height: 5\' 7"  (1.702 m)   Body mass index is 31.79 kg/m.  General Alert, O x 3, WD, NAD  Pulmonary Sym exp, good B air movt, CTA B  Cardiac RRR, Nl S1, S2, no Murmurs, No rubs, No S3,S4  Vascular Vessel Right Left  Radial Palpable Palpable  Brachial Palpable Palpable  Carotid Palpable, No Bruit Palpable, No Bruit  Aorta Not palpable N/A  Femoral Palpable Palpable  Popliteal Not palpable Not palpable  PT Palpable Palpable  DP Not palpable Not palpable    Gastro- intestinal soft, non-distended, non-tender to palpation, No guarding or rebound, no HSM, no masses, no CVAT B, No palpable prominent aortic pulse,    Musculo- skeletal M/S 5/5 throughout  , Extremities without ischemic changes  , No edema present, No visible varicosities , No Lipodermatosclerosis present  Neurologic Pain and light touch intact in extremities  , Motor exam as listed above    Radiology     CTA abd/pelvis (07/04/17):  1. Maximal diameter of the tortuous and fusiform infrarenal abdominal aortic aneurysm is felt to be stable at 4.2 cm. However, proximal  and distal aspects of the aneurysm appear 5-6 mm larger in diameter based on measurements documented above. Also supporting overall aneurysm enlargement and slower intraluminal flow is increased prominence of mural thrombus along the right lateral lumen of the aneurysm. 2. Slightly more prominent aneurysmal disease of the distal common iliac arteries bilaterally, right greater than left, and the proximal right internal iliac artery.  I reviewed the patient's CTA, AAA remains moderate is size without any worrisome findings.  Pt doubt pt's anatomy is compatible with an infrarenal device as I'm measuring 31 mm at 15 mm distal to neck.   R CIA is roughly 2.9 cm with R IIA ectatic at the take off.  L CIA is 1.9 cm.   Medical Decision Making   Jeremy Mclean is a 67 y.o. (1950-05-29) male who presents with: asymptomatic AAA with slowly increasing size, R > L CIA aneurysms, R IIA ectasia   Patient will likely need juxtarenal open repair given size of aorta at the neck.  R IIA may need ligation at that time,  R Aortic limb might able to implant on external iliac artery at the iliac bifurcation.  Similarly L aortic limb might be implanted on bifurcation into left iliac bifurcation.   At this point, there is no absolute indication to proceeding with repair.  Based on this patient's exam and diagnostic studies, he needs repeat AAA duplex in 6 months.  The threshold for repair is AAA size > 5.5 cm, growth > 1 cm/yr, CIA aneurysm > 3.5 cm and symptomatic status.  I emphasized the importance of maximal medical management including strict control of blood pressure, blood glucose, and lipid levels, antiplatelet agents, obtaining regular exercise, and cessation of smoking.    Thank you for allowing [place] to participate in this patient's care.   Adele Barthel, MD, FACS [facility] [place] Office: [phone] Pager: [phone]

## 2017-08-03 ENCOUNTER — Other Ambulatory Visit: Payer: Self-pay

## 2017-08-03 DIAGNOSIS — I713 Abdominal aortic aneurysm, ruptured, unspecified: Secondary | ICD-10-CM

## 2017-11-02 DIAGNOSIS — J449 Chronic obstructive pulmonary disease, unspecified: Secondary | ICD-10-CM | POA: Diagnosis not present

## 2017-11-02 DIAGNOSIS — I714 Abdominal aortic aneurysm, without rupture: Secondary | ICD-10-CM | POA: Diagnosis not present

## 2017-11-02 DIAGNOSIS — I10 Essential (primary) hypertension: Secondary | ICD-10-CM | POA: Diagnosis not present

## 2018-01-03 ENCOUNTER — Other Ambulatory Visit (HOSPITAL_COMMUNITY): Payer: Medicare HMO

## 2018-01-03 ENCOUNTER — Ambulatory Visit: Payer: Medicare HMO | Admitting: Vascular Surgery

## 2018-01-09 ENCOUNTER — Other Ambulatory Visit: Payer: Self-pay

## 2018-01-09 ENCOUNTER — Ambulatory Visit (INDEPENDENT_AMBULATORY_CARE_PROVIDER_SITE_OTHER): Payer: Medicare HMO | Admitting: Vascular Surgery

## 2018-01-09 ENCOUNTER — Encounter: Payer: Self-pay | Admitting: Vascular Surgery

## 2018-01-09 ENCOUNTER — Ambulatory Visit (HOSPITAL_COMMUNITY)
Admission: RE | Admit: 2018-01-09 | Discharge: 2018-01-09 | Disposition: A | Discharge status: Home / Self Care | Payer: Medicare HMO | Source: Ambulatory Visit | Attending: Vascular Surgery | Admitting: Vascular Surgery

## 2018-01-09 VITALS — BP 155/103 | HR 66 | Temp 97.4°F | Resp 16 | Ht 70.0 in | Wt 202.0 lb

## 2018-01-09 DIAGNOSIS — I714 Abdominal aortic aneurysm, without rupture, unspecified: Secondary | ICD-10-CM

## 2018-01-09 DIAGNOSIS — I723 Aneurysm of iliac artery: Secondary | ICD-10-CM | POA: Diagnosis not present

## 2018-01-09 DIAGNOSIS — I713 Abdominal aortic aneurysm, ruptured, unspecified: Secondary | ICD-10-CM

## 2018-01-09 NOTE — Progress Notes (Signed)
Patient name: Jeremy Mclean MRN: [id number] DOB: 26-Feb-1950 Sex: male  REASON FOR VISIT: 46-month follow-up for infrarenal abdominal aneurysm and bilateral common iliac aneurysm surveillance  HPI: Jeremy Mclean is a 67 y.o. male with history of COPD that presents for 53-month follow-up for ongoing surveillance of his infrarenal and bilateral common iliac aneurysms.  He was previously followed by Dr. Bridgett Larsson and was last seen in May 2019 and had a 4.2 cm infrarenal aneurysm as well as a 2.9 cm right common iliac and 1.9 cm left common iliac aneurysm.  There was concern that given a 31 mm neck he would likely not be a endovascular candidate.  On today's evaluation he reports a lot of abdominal distention with bloating and gas over the last several weeks.  He states he is going to see his primary care doctor this afternoon.  He is got some kind of burning upper abdominal epigastric discomfort but no lower abdominal pain and states has a hx of ulcer disease.  Past Medical History:  Diagnosis Date  . AAA (abdominal aortic aneurysm) ([place])   . Anxiety   . Arthritis   . COPD (chronic obstructive pulmonary disease) ([place])   . Depression   . Diverticulitis   . GERD (gastroesophageal reflux disease)   . Hypertension   . Kidney stone     Past Surgical History:  Procedure Laterality Date  . BACK SURGERY     cervical fusion  . BIOPSY  01/14/2017   Procedure: BIOPSY;  Surgeon: Danie Binder, MD;  [facility];  Service: Endoscopy;;  gastric   . COLONOSCOPY N/A 04/09/2014   Procedure: COLONOSCOPY;  Surgeon: Rogene Houston, MD;  Location: [facility];  Service: Endoscopy;  Laterality: N/A;  730 - moved to 7:30 - Ann to notify pt  . CYSTOSCOPY     with stent  . ESOPHAGOGASTRODUODENOSCOPY N/A 01/14/2017   Procedure: ESOPHAGOGASTRODUODENOSCOPY (EGD);  Surgeon: Danie Binder, MD;  Location: [facility];  Service: Endoscopy;  Laterality: N/A;  . kidney stent    . Neck sx with plate       Family History  Problem Relation Age of Onset  . Diabetes Mother   . Cancer Father   . Cancer Other   . Diabetes Other     SOCIAL HISTORY: Social History   Tobacco Use  . Smoking status: Current Every Day Smoker    Packs/day: 1.00    Years: 40.00    Pack years: 40.00    Types: Cigarettes  . Smokeless tobacco: Never Used  Substance Use Topics  . Alcohol use: No    Alcohol/week: 0.0 standard drinks    Allergies  Allergen Reactions  . Contrast Media [Iodinated Diagnostic Agents] Other (See Comments)    Burning, needs pre meds  . Iohexol Hives, Itching and Other (See Comments)    Burning. Needs pre-meds   . Prednisone Other (See Comments)    Agitation and hyperactivity    Current Outpatient Medications  Medication Sig Dispense Refill  . albuterol (PROVENTIL HFA;VENTOLIN HFA) 108 (90 Base) MCG/ACT inhaler Inhale 1-2 puffs into the lungs every 6 (six) hours as needed for wheezing or shortness of breath.    Marland Kitchen amLODipine (NORVASC) 10 MG tablet TK 1 T PO QD  4  . BREO ELLIPTA 200-25 MCG/INH AEPB Inhale 1 puff into the lungs daily.  12  . diazepam (VALIUM) 5 MG tablet Take 5 mg by mouth every 6 (six) hours as needed for anxiety.  Anxiety    . diphenhydrAMINE (BENADRYL) 50 MG capsule Take 50 mg by mouth 1 hour prior to your procedure. 1 capsule 0  . lisinopril (PRINIVIL,ZESTRIL) 40 MG tablet TK 1 T PO QD  4  . pantoprazole (PROTONIX) 40 MG tablet Take 1 tablet (40 mg total) by mouth 2 (two) times daily before a meal. 60 tablet 2  . predniSONE (DELTASONE) 50 MG tablet One tablet (50mg ) 13 hours prior to procedure; one tablet (50mg ) 7 hours prior to procedure and then one tablet (50 mg) one hour prior to procedure. 3 tablet 0  . traMADol (ULTRAM) 50 MG tablet Take 50 mg by mouth every 6 (six) hours as needed for moderate pain.      No current facility-administered medications for this visit.    Facility-Administered Medications Ordered in Other Visits  Medication Dose Route  Frequency Provider Last Rate Last Dose  . rabies immune globulin (HYPERAB) injection 1,720 Units  1,720 Units Intramuscular Once Jennye Boroughs M, PA-C      . rabies vaccine (RABAVERT/IMOVAX) injection 1 mL  1 mL Intramuscular Once Duaine Dredge, PA-C        REVIEW OF SYSTEMS:  [X]  denotes positive finding, [ ]  denotes negative finding Cardiac  Comments:  Chest pain or chest pressure:    Shortness of breath upon exertion:    Short of breath when lying flat:    Irregular heart rhythm:        Vascular    Pain in calf, thigh, or hip brought on by ambulation:    Pain in feet at night that wakes you up from your sleep:     Blood clot in your veins:    Leg swelling:         Pulmonary    Oxygen at home:    Productive cough:     Wheezing:         Neurologic    Sudden weakness in arms or legs:     Sudden numbness in arms or legs:     Sudden onset of difficulty speaking or slurred speech:    Temporary loss of vision in one eye:     Problems with dizziness:         Gastrointestinal    Blood in stool:     Vomited blood:         Genitourinary    Burning when urinating:     Blood in urine:        Psychiatric    Major depression:         Hematologic    Bleeding problems:    Problems with blood clotting too easily:        Skin    Rashes or ulcers:        Constitutional    Fever or chills:      PHYSICAL EXAM: Vitals:   01/09/18 0913  BP: (!) 155/103  Pulse: 66  Resp: 16  Temp: (!) 97.4 F (36.3 C)  TempSrc: Oral  SpO2: 100%  Weight: 91.6 kg  Height: 5\' 10"  (1.778 m)    GENERAL: The patient is a well-nourished male, in no acute distress. The vital signs are documented above. CARDIAC: There is a regular rate and rhythm.  VASCULAR:  2+ palpable bilateral femoral pulses 2+ palpable PT pulses bilateral lower extremities PULMONARY: There is good air exchange bilaterally without wheezing or rales. ABDOMEN: No lower or midline tenderness.  Some discomfort in upper  epigastric region.  No rebound or  guarding. MUSCULOSKELETAL: There are no major deformities or cyanosis. NEUROLOGIC: No focal weakness or paresthesias are detected. SKIN: There are no ulcers or rashes noted. PSYCHIATRIC: The patient has a normal affect.  DATA:   I independent reviewed his aortoiliac duplex and his infrarenal aneurysm now appears to be about 4.5 cm from 4.2 cm.  Could not safely visualize common iliac arteries given patient discomfort.  Assessment/Plan:  67 year old male that presents for ongoing surveillance of his infrarenal and bilateral common iliac aneurysms.  On duplex today his infrarenal aneurysm is slightly increased from 4.2 to 4.5 cm but cannot visualize the iliacs due to his discomfort during the exam.  He is having a fair amount of bloating and gas and upper epigastric burning pain and is seeing his PCP today.  Given that his last study six months ago he had a 2.9 cm right common iliac aneurysm and a 1.9 left common iliac aneurysm I think we should repeat a CT scan to ensure there is no significant change that would warrant intervention at this time.  Discussed that we typically consider intervening on common iliac aneurysms in the 3 to 3.5 cm range.  I will order CT today and then tentatively schedule him for follow-up in 6 months with a repeat aortoiliac duplex.  I will contact patient with results and we can change plans accordingly if needed pending any interval changes.   Marty Heck, MD [facility] Office: [phone] Pager: [phone]

## 2018-01-10 ENCOUNTER — Other Ambulatory Visit: Payer: Self-pay

## 2018-01-10 ENCOUNTER — Telehealth: Payer: Self-pay | Admitting: Nurse Practitioner

## 2018-01-10 DIAGNOSIS — I714 Abdominal aortic aneurysm, without rupture, unspecified: Secondary | ICD-10-CM

## 2018-01-10 MED ORDER — PREDNISONE 50 MG PO TABS: ORAL_TABLET | ORAL | 0 refills | Status: DC

## 2018-01-10 MED ORDER — DIPHENHYDRAMINE HCL 50 MG PO CAPS: ORAL_CAPSULE | ORAL | 0 refills | Status: DC

## 2018-01-10 NOTE — Telephone Encounter (Signed)
Phone call to patient to inform patient of 13 hr prep instructions for CT on 11/25. Pt verbalized understanding. Rx called into [facility] in [place]. 13hr prep instructions: 0130- 50mg  Prednisone 0730- 50mg  Prednisone 1330- 50mg  Prednisone and 50mg  Benadryl

## 2018-01-15 ENCOUNTER — Other Ambulatory Visit: Payer: Medicare HMO

## 2018-01-22 DIAGNOSIS — Z23 Encounter for immunization: Secondary | ICD-10-CM | POA: Diagnosis not present

## 2018-01-29 ENCOUNTER — Ambulatory Visit (HOSPITAL_COMMUNITY)
Admission: RE | Admit: 2018-01-29 | Discharge: 2018-01-29 | Disposition: A | Discharge status: Home / Self Care | Payer: Medicare HMO | Source: Ambulatory Visit | Attending: Vascular Surgery | Admitting: Vascular Surgery

## 2018-01-29 DIAGNOSIS — I723 Aneurysm of iliac artery: Secondary | ICD-10-CM | POA: Diagnosis not present

## 2018-01-29 DIAGNOSIS — I714 Abdominal aortic aneurysm, without rupture, unspecified: Secondary | ICD-10-CM

## 2018-01-29 LAB — POCT I-STAT CREATININE: Creatinine, Ser: 1 mg/dL (ref 0.61–1.24)

## 2018-01-29 MED ORDER — IOPAMIDOL (ISOVUE-370) INJECTION 76%
100.0000 mL | Freq: Once | INTRAVENOUS | Status: AC | PRN
  Administered 2018-01-29: 100 mL via INTRAVENOUS

## 2018-02-05 ENCOUNTER — Telehealth: Payer: Self-pay | Admitting: Vascular Surgery

## 2018-02-05 NOTE — Telephone Encounter (Signed)
Have tried to call Mr. Matich several times over the last several weeks to discuss his CTA for ongoing surveillance of his infrarenal abdominal aortic aneurysm and right common iliac aneurysm.  The right common iliac aneurysm has grown slightly from about 2.9 cm to just over 3 cm.  Typically repair common iliac aneuryms in the 3.0-3.5 cm range.  The infrarenal abdominal aortic aneurysm has grown from 4.2 to 4.4 cm.  I do not think he is a straightforward endovascular candidate given his short aneurysm neck and would likely require an open repair.  Given that and minimal growth over past 6 months, I am fine with ongoing surveillance with repeat CT in another 6 months.  I will try and call him again this week with contacts that he made available to [place] in clinic.  Marty Heck, MD [facility] [place] Office: [phone] Pager: [place]

## 2018-02-28 DIAGNOSIS — J449 Chronic obstructive pulmonary disease, unspecified: Secondary | ICD-10-CM | POA: Diagnosis not present

## 2018-02-28 DIAGNOSIS — I10 Essential (primary) hypertension: Secondary | ICD-10-CM | POA: Diagnosis not present

## 2018-02-28 DIAGNOSIS — F419 Anxiety disorder, unspecified: Secondary | ICD-10-CM | POA: Diagnosis not present

## 2018-02-28 DIAGNOSIS — Z79899 Other long term (current) drug therapy: Secondary | ICD-10-CM | POA: Diagnosis not present

## 2018-02-28 DIAGNOSIS — Z125 Encounter for screening for malignant neoplasm of prostate: Secondary | ICD-10-CM | POA: Diagnosis not present

## 2018-03-07 DIAGNOSIS — I714 Abdominal aortic aneurysm, without rupture: Secondary | ICD-10-CM | POA: Diagnosis not present

## 2018-03-07 DIAGNOSIS — J449 Chronic obstructive pulmonary disease, unspecified: Secondary | ICD-10-CM | POA: Diagnosis not present

## 2018-03-07 DIAGNOSIS — I10 Essential (primary) hypertension: Secondary | ICD-10-CM | POA: Diagnosis not present

## 2018-03-07 DIAGNOSIS — E785 Hyperlipidemia, unspecified: Secondary | ICD-10-CM | POA: Diagnosis not present

## 2018-03-07 DIAGNOSIS — Z6834 Body mass index (BMI) 34.0-34.9, adult: Secondary | ICD-10-CM | POA: Diagnosis not present

## 2018-06-19 ENCOUNTER — Other Ambulatory Visit: Payer: Self-pay

## 2018-06-19 DIAGNOSIS — I714 Abdominal aortic aneurysm, without rupture, unspecified: Secondary | ICD-10-CM

## 2018-06-19 DIAGNOSIS — I723 Aneurysm of iliac artery: Secondary | ICD-10-CM

## 2018-07-03 DIAGNOSIS — J449 Chronic obstructive pulmonary disease, unspecified: Secondary | ICD-10-CM | POA: Diagnosis not present

## 2018-07-03 DIAGNOSIS — I10 Essential (primary) hypertension: Secondary | ICD-10-CM | POA: Diagnosis not present

## 2018-07-10 ENCOUNTER — Other Ambulatory Visit (HOSPITAL_COMMUNITY): Payer: Medicare HMO

## 2018-07-10 ENCOUNTER — Encounter (HOSPITAL_COMMUNITY): Payer: Medicare HMO

## 2018-07-10 ENCOUNTER — Ambulatory Visit: Payer: Medicare HMO | Admitting: Vascular Surgery

## 2018-11-02 DIAGNOSIS — J449 Chronic obstructive pulmonary disease, unspecified: Secondary | ICD-10-CM | POA: Diagnosis not present

## 2018-11-02 DIAGNOSIS — M5090 Cervical disc disorder, unspecified, unspecified cervical region: Secondary | ICD-10-CM | POA: Diagnosis not present

## 2018-11-02 DIAGNOSIS — I10 Essential (primary) hypertension: Secondary | ICD-10-CM | POA: Diagnosis not present

## 2018-11-13 ENCOUNTER — Encounter (HOSPITAL_COMMUNITY): Payer: Medicare HMO

## 2018-11-13 ENCOUNTER — Ambulatory Visit (HOSPITAL_COMMUNITY)
Admission: RE | Admit: 2018-11-13 | Discharge: 2018-11-13 | Disposition: A | Discharge status: Home / Self Care | Payer: Medicare HMO | Source: Ambulatory Visit | Attending: Vascular Surgery | Admitting: Vascular Surgery

## 2018-11-13 ENCOUNTER — Other Ambulatory Visit: Payer: Self-pay

## 2018-11-13 ENCOUNTER — Other Ambulatory Visit: Payer: Self-pay | Admitting: Vascular Surgery

## 2018-11-13 ENCOUNTER — Ambulatory Visit (INDEPENDENT_AMBULATORY_CARE_PROVIDER_SITE_OTHER): Payer: Medicare HMO | Admitting: Vascular Surgery

## 2018-11-13 ENCOUNTER — Encounter: Payer: Self-pay | Admitting: Vascular Surgery

## 2018-11-13 VITALS — BP 142/92 | HR 65 | Temp 97.4°F | Resp 20 | Ht 70.0 in | Wt 212.0 lb

## 2018-11-13 DIAGNOSIS — I723 Aneurysm of iliac artery: Secondary | ICD-10-CM

## 2018-11-13 DIAGNOSIS — I714 Abdominal aortic aneurysm, without rupture, unspecified: Secondary | ICD-10-CM

## 2018-11-13 MED ORDER — DIPHENHYDRAMINE HCL 50 MG PO CAPS: ORAL_CAPSULE | ORAL | 0 refills | Status: AC

## 2018-11-13 MED ORDER — PREDNISONE 50 MG PO TABS: ORAL_TABLET | ORAL | 0 refills | Status: DC

## 2018-11-13 NOTE — Progress Notes (Signed)
[facility]  Patient name: Jeremy Mclean MRN: [id number] DOB: Feb 13, 1951 Sex: male  REASON FOR VISIT: Continued follow-up of abdominal aortic aneurysm and right common iliac artery aneurysm  HPI: Jeremy Mclean is a 68 y.o. male here for continued follow-up.  He has requested that I follow his aneurysmal disease.  He initially saw Dr. Geryl Councilman and then Dr. Carlis Abbott.  He has a known moderate size abdominal aortic aneurysm and also moderate to large right common iliac artery aneurysm.  He is here today for repeat duplex evaluation.  He has no symptoms referable to this.  His most recent evaluation included a CT angiogram on 01/31/2008.  At that time his aorta was in the mid 4 cm range.  His common iliac artery was estimated at 3.1 cm.  He has no cardiac difficulty and has no history of peripheral vascular occlusive disease.  He is a Geneticist, molecular.  He is here today with his wife.  Past Medical History:  Diagnosis Date  . AAA (abdominal aortic aneurysm) ([place])   . Anxiety   . Arthritis   . COPD (chronic obstructive pulmonary disease) ([place])   . Depression   . Diverticulitis   . GERD (gastroesophageal reflux disease)   . Hypertension   . Kidney stone     Family History  Problem Relation Age of Onset  . Diabetes Mother   . Cancer Father   . Cancer Other   . Diabetes Other     SOCIAL HISTORY: Social History   Tobacco Use  . Smoking status: Current Every Day Smoker    Packs/day: 1.00    Years: 40.00    Pack years: 40.00    Types: Cigarettes  . Smokeless tobacco: Never Used  Substance Use Topics  . Alcohol use: No    Alcohol/week: 0.0 standard drinks    Allergies  Allergen Reactions  . Contrast Media [Iodinated Diagnostic Agents] Other (See Comments)    Burning, needs pre meds  . Iohexol Hives, Itching and Other (See Comments)    Burning. Needs pre-meds   . Prednisone Other (See Comments)    Agitation and  hyperactivity    Current Outpatient Medications  Medication Sig Dispense Refill  . albuterol (PROVENTIL HFA;VENTOLIN HFA) 108 (90 Base) MCG/ACT inhaler Inhale 1-2 puffs into the lungs every 6 (six) hours as needed for wheezing or shortness of breath.    Marland Kitchen amLODipine (NORVASC) 10 MG tablet TK 1 T PO QD  4  . BREO ELLIPTA 200-25 MCG/INH AEPB Inhale 1 puff into the lungs daily.  12  . diazepam (VALIUM) 5 MG tablet Take 5 mg by mouth every 6 (six) hours as needed for anxiety. Anxiety    . lisinopril (PRINIVIL,ZESTRIL) 40 MG tablet TK 1 T PO QD  4  . pantoprazole (PROTONIX) 40 MG tablet Take 1 tablet (40 mg total) by mouth 2 (two) times daily before a meal. 60 tablet 2  . traMADol (ULTRAM) 50 MG tablet Take 50 mg by mouth every 6 (six) hours as needed for moderate pain.     . diphenhydrAMINE (BENADRYL) 50 MG capsule Take 50 mg by mouth 1 hour prior to your procedure. (Patient not taking: Reported on 11/13/2018) 1 capsule 0  . predniSONE (DELTASONE) 50 MG tablet One tablet (50mg ) 13 hours prior to procedure; one tablet (50mg ) 7 hours prior to procedure and then one tablet (50 mg) one hour prior to procedure. (Patient not taking: Reported on 11/13/2018) 3 tablet 0  No current facility-administered medications for this visit.    Facility-Administered Medications Ordered in Other Visits  Medication Dose Route Frequency Provider Last Rate Last Dose  . rabies immune globulin (HYPERAB) injection 1,720 Units  1,720 Units Intramuscular Once Jennye Boroughs M, PA-C      . rabies vaccine (RABAVERT/IMOVAX) injection 1 mL  1 mL Intramuscular Once Duaine Dredge, PA-C        REVIEW OF SYSTEMS:  [X]  denotes positive finding, [ ]  denotes negative finding Cardiac  Comments:  Chest pain or chest pressure:    Shortness of breath upon exertion: x   Short of breath when lying flat:    Irregular heart rhythm:        Vascular    Pain in calf, thigh, or hip brought on by ambulation:    Pain in feet at night  that wakes you up from your sleep:     Blood clot in your veins:    Leg swelling:           PHYSICAL EXAM: Vitals:   11/13/18 0858  BP: (!) 142/92  Pulse: 65  Resp: 20  Temp: (!) 97.4 F (36.3 C)  SpO2: 96%  Weight: 212 lb (96.2 kg)  Height: 5\' 10"  (1.778 m)    GENERAL: The patient is a well-nourished male, in no acute distress. The vital signs are documented above. CARDIOVASCULAR: Carotid arteries without bruits bilaterally.  He has obesity and I do not palpate an aneurysm.  He has easily palpable femoral pulses bilaterally.  I do not palpate popliteal pulses or aneurysm. PULMONARY: There is good air exchange  MUSCULOSKELETAL: There are no major deformities or cyanosis. NEUROLOGIC: No focal weakness or paresthesias are detected. SKIN: There are no ulcers or rashes noted. PSYCHIATRIC: The patient has a normal affect.  DATA:  Noninvasive studies today somewhat limited due to bowel gas.  His largest aortic measurement is slightly increased to 4.7 cm.  His right common iliac artery appears to be more dilated at 4.2 cm.  MEDICAL ISSUES: Long discussion with the patient and his wife present.  I would be surprised if his right iliac artery had indeed increased over 1 cm in 9 months since the CT scan.  This may be an over estimation by ultrasound.  I have recommended repeat CT scan for further identification.  In reviewing his CT from December 2019, he does have a very short infrarenal aortic neck.  He has a very unusual high takeoff of his left renal artery.  Does have extension of his right common iliac artery aneurysm into his iliac bifurcation.  In all likelihood will require open repair.  We will coordinate a outpatient CT scan at [facility] and I will discuss this further with him by telephone following the imaging procedure.  If he shows any growth since December 2019, I would recommend repair.  Explained to him that we would obtain cardiac clearance prior to any surgery.     Rosetta Posner, MD FACS [facility] Tel [phone] Pager [phone]

## 2018-11-20 DIAGNOSIS — Z23 Encounter for immunization: Secondary | ICD-10-CM | POA: Diagnosis not present

## 2018-11-26 ENCOUNTER — Ambulatory Visit (HOSPITAL_COMMUNITY)
Admission: RE | Admit: 2018-11-26 | Discharge: 2018-11-26 | Disposition: A | Discharge status: Home / Self Care | Payer: Medicare HMO | Source: Ambulatory Visit | Attending: Vascular Surgery | Admitting: Vascular Surgery

## 2018-11-26 ENCOUNTER — Other Ambulatory Visit: Payer: Self-pay

## 2018-11-26 DIAGNOSIS — I723 Aneurysm of iliac artery: Secondary | ICD-10-CM

## 2018-11-26 DIAGNOSIS — I714 Abdominal aortic aneurysm, without rupture, unspecified: Secondary | ICD-10-CM

## 2018-11-26 LAB — POCT I-STAT CREATININE: Creatinine, Ser: 1.1 mg/dL (ref 0.61–1.24)

## 2018-11-26 MED ORDER — IOHEXOL 350 MG/ML SOLN
100.0000 mL | Freq: Once | INTRAVENOUS | Status: AC | PRN
  Administered 2018-11-26: 100 mL via INTRAVENOUS

## 2018-12-03 DIAGNOSIS — J441 Chronic obstructive pulmonary disease with (acute) exacerbation: Secondary | ICD-10-CM | POA: Diagnosis not present

## 2018-12-04 ENCOUNTER — Other Ambulatory Visit: Payer: Self-pay | Admitting: *Deleted

## 2018-12-04 DIAGNOSIS — Z20828 Contact with and (suspected) exposure to other viral communicable diseases: Secondary | ICD-10-CM | POA: Diagnosis not present

## 2018-12-04 DIAGNOSIS — Z20822 Contact with and (suspected) exposure to covid-19: Secondary | ICD-10-CM

## 2018-12-06 LAB — NOVEL CORONAVIRUS, NAA: SARS-CoV-2, NAA: NOT DETECTED

## 2018-12-11 ENCOUNTER — Telehealth: Payer: Self-pay | Admitting: Vascular Surgery

## 2018-12-11 NOTE — Telephone Encounter (Signed)
I called the patient to discuss his recent CT scan done at [facility] on 11/26/2018.  This did show continued mention of his infrarenal aorta to 4.6 cm from previous of 4.4 and December 2019.  He has also had enlargement in his right common iliac aneurysm now 3.5 up from 3.2.  He does have fusiform dilatation of his left common iliac artery at 2.1.  At his young age and continued expansion I have recommended elective repair.  He is not a candidate for infrarenal stent graft repair due to angulation and extension up to the level of his right renal artery which is lowest renal artery.  I have recommended open repair.  We will coordinate cardiac screen prior to surgery and will get him back to our office for face-to-face discussion given the magnitude of the operation.  He is comfortable with this plan.

## 2018-12-14 ENCOUNTER — Encounter: Payer: Self-pay | Admitting: Cardiovascular Disease

## 2018-12-14 ENCOUNTER — Other Ambulatory Visit: Payer: Self-pay

## 2018-12-14 ENCOUNTER — Ambulatory Visit (INDEPENDENT_AMBULATORY_CARE_PROVIDER_SITE_OTHER): Payer: Medicare HMO | Admitting: Cardiovascular Disease

## 2018-12-14 VITALS — BP 128/86 | HR 66 | Temp 97.2°F | Ht 70.0 in | Wt 213.2 lb

## 2018-12-14 DIAGNOSIS — Z01818 Encounter for other preprocedural examination: Secondary | ICD-10-CM

## 2018-12-14 DIAGNOSIS — Z008 Encounter for other general examination: Secondary | ICD-10-CM | POA: Diagnosis not present

## 2018-12-14 DIAGNOSIS — Z13220 Encounter for screening for lipoid disorders: Secondary | ICD-10-CM

## 2018-12-14 DIAGNOSIS — I723 Aneurysm of iliac artery: Secondary | ICD-10-CM | POA: Diagnosis not present

## 2018-12-14 NOTE — Assessment & Plan Note (Signed)
History of essential hypertension with blood pressure measured today at 128/86.  He is on amlodipine and lisinopril.

## 2018-12-14 NOTE — Assessment & Plan Note (Signed)
Recent abdominal CTA performed 11/26/2018 revealed an infrarenal abdominal like aneurysm measuring 4.6 cm followed by Dr. Donnetta Hutching.

## 2018-12-14 NOTE — Patient Instructions (Signed)
Medication Instructions:  Your physician recommends that you continue on your current medications as directed. Please refer to the Current Medication list given to you today.  If you need a refill on your cardiac medications before your next appointment, please call your pharmacy.   Lab work: Your physician recommends that you return for lab work WITHIN 1 WEEK:  FASTING LIPID PROFILE AND LIVER FUNCTION TEST.  YOU WILL RECEIVE A LAB SLIP IN THE MAIL. PLEASE DO NOT EAT OR DRINK (EXCEPT WATER) ANYTHING AFTER MIDNIGHT ON THE DAY YOU CHOOSE TO PRESENT FOR LAB WORK. YOU MAY EAT AFTER YOUR BLOOD HAS BEEN COLLECTED. NO APPOINTMENT IS NEEDED.   If you have labs (blood work) drawn today and your tests are completely normal, you will receive your results only by:  [place] (if you have MyChart) OR  A paper copy in the mail If you have any lab test that is abnormal or we need to change your treatment, we will call you to review the results.  Testing/Procedures: Your physician has requested that you have a lexiscan myoview. For further information please visit [email or web address]. Please follow instruction sheet, as given.   Cardiac Nuclear Scan A cardiac nuclear scan is a test that is done to check the flow of blood to your heart. It is done when you are resting and when you are exercising. The test looks for problems such as:  Not enough blood reaching a portion of the heart.  The heart muscle not working as it should. You may need this test if:  You have heart disease.  You have had lab results that are not normal.  You have had heart surgery or a balloon procedure to open up blocked arteries (angioplasty).  You have chest pain.  You have shortness of breath. In this test, a special dye (tracer) is put into your bloodstream. The tracer will travel to your heart. A camera will then take pictures of your heart to see how the tracer moves through your heart. This test is usually done  at a hospital and takes 2-4 hours. Tell a doctor about:  Any allergies you have.  All medicines you are taking, including vitamins, herbs, eye drops, creams, and over-the-counter medicines.  Any problems you or family members have had with anesthetic medicines.  Any blood disorders you have.  Any surgeries you have had.  Any medical conditions you have.  Whether you are pregnant or may be pregnant. What are the risks? Generally, this is a safe test. However, problems may occur, such as:  Serious chest pain and heart attack. This is only a risk if the stress portion of the test is done.  Rapid heartbeat.  A feeling of warmth in your chest. This feeling usually does not last long.  Allergic reaction to the tracer. What happens before the test?  Ask your doctor about changing or stopping your normal medicines. This is important.  Follow instructions from your doctor about what you cannot eat or drink.  Remove your jewelry on the day of the test. What happens during the test?  An IV tube will be inserted into one of your veins.  Your doctor will give you a small amount of tracer through the IV tube.  You will wait for 20-40 minutes while the tracer moves through your bloodstream.  Your heart will be monitored with an electrocardiogram (ECG).  You will lie down on an exam table.  Pictures of your heart will be taken for about 15-20  minutes.  You may also have a stress test. For this test, one of these things may be done: ? You will be asked to exercise on a treadmill or a stationary bike. ? You will be given medicines that will make your heart work harder. This is done if you are unable to exercise.  When blood flow to your heart has peaked, a tracer will again be given through the IV tube.  After 20-40 minutes, you will get back on the exam table. More pictures will be taken of your heart.  Depending on the tracer that is used, more pictures may need to be taken 3-4  hours later.  Your IV tube will be removed when the test is over. The test may vary among doctors and hospitals. What happens after the test?  Ask your doctor: ? Whether you can return to your normal schedule, including diet, activities, and medicines. ? Whether you should drink more fluids. This will help to remove the tracer from your body. Drink enough fluid to keep your pee (urine) pale yellow.  Ask your doctor, or the department that is doing the test: ? When will my results be ready? ? How will I get my results? Summary  A cardiac nuclear scan is a test that is done to check the flow of blood to your heart.  Tell your doctor whether you are pregnant or may be pregnant.  Before the test, ask your doctor about changing or stopping your normal medicines. This is important.  Ask your doctor whether you can return to your normal activities. You may be asked to drink more fluids. This information is not intended to replace advice given to you by your health care provider. Make sure you discuss any questions you have with your health care provider. Document Released: 07/24/2017 Document Revised: 05/30/2018 Document Reviewed: 07/24/2017 Elsevier Patient Education  [place]: At [facility] you and your health needs are our priority.  As part of our continuing mission to provide you with exceptional heart care, we have created designated Provider Care Teams.  These Care Teams include your primary Cardiologist (physician) and Advanced Practice Providers (APPs -  Physician Assistants and Nurse Practitioners) who all work together to provide you with the care you need, when you need it.  You will need a follow up appointment AS NEEDED. You may see Dr. Gwenlyn Found or one of the following Advanced Practice Providers on your designated Care Team:    Kerin Ransom, PA-C  [address] PA-C  [place], [place]

## 2018-12-14 NOTE — Assessment & Plan Note (Signed)
Jeremy Mclean has a right iliac artery aneurysm measuring 4.2 cm.  He is scheduled to have this repaired by Dr. Jacelyn Grip was referred for preoperative clearance.  His cardiovascular risk factors are notable for 50 pack years of tobacco use and treated hypertension.  I am going to get a pharmacologic Myoview stress test to risk stratify him.

## 2018-12-14 NOTE — Progress Notes (Signed)
12/14/2018 Jeremy Mclean   [birth year]  [id number]  Primary Physician Jeremy Noble, MD Primary Cardiologist: Jeremy Harp MD Jeremy Mclean, [place]  HPI:  Jeremy Mclean is a 68 y.o. moderately overweight married Caucasian male father of 2 daughters, grandfather of 2 grandchildren referred by Jeremy Mclean for preoperative clearance before right common iliac artery aneurysm resection and grafting.  He has seen Jeremy Mclean in the past for preoperative clearance before endoluminal stent grafting back in 2017 by Jeremy Mclean  which never was performed.  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  His risk factors include 50 pack years of tobacco abuse currently smoking 1 pack/day as well as treated hypertension.  There is no family history for heart disease.  Never had heart attack or stroke.  He denies chest pain or shortness of breath.  He does have a 4.6 cm infrarenal abdominal aortic aneurysm of 4.2 cm right common iliac artery aneurysm.   Current Meds  Medication Sig  . albuterol (PROVENTIL HFA;VENTOLIN HFA) 108 (90 Base) MCG/ACT inhaler Inhale 1-2 puffs into the lungs every 6 (six) hours as needed for wheezing or shortness of breath.  Marland Kitchen amLODipine (NORVASC) 10 MG tablet TK 1 T PO QD  . BREO ELLIPTA 200-25 MCG/INH AEPB Inhale 1 puff into the lungs daily.  . diazepam (VALIUM) 5 MG tablet Take 5 mg by mouth every 6 (six) hours as needed for anxiety. Anxiety  . diphenhydrAMINE (BENADRYL) 50 MG capsule Take 50 mg by mouth 1 hour prior to your procedure.  Marland Kitchen lisinopril (PRINIVIL,ZESTRIL) 40 MG tablet TK 1 T PO QD  . pantoprazole (PROTONIX) 40 MG tablet Take 1 tablet (40 mg total) by mouth 2 (two) times daily before a meal.  . predniSONE (DELTASONE) 50 MG tablet One tablet (50mg ) 13 hours prior to procedure; one tablet (50mg ) 7 hours prior to procedure and then one tablet (50 mg) one hour prior to procedure.  . traMADol (ULTRAM) 50 MG tablet Take 50 mg by mouth every 6 (six) hours as needed for moderate  pain.      Allergies  Allergen Reactions  . Contrast Media [Iodinated Diagnostic Agents] Other (See Comments)    Burning, needs pre meds  . Iohexol Hives, Itching and Other (See Comments)    Burning. Needs pre-meds   . Prednisone Other (See Comments)    Agitation and hyperactivity.  Per patient, he can take small doses for IV Dye Prep.    Social History   Socioeconomic History  . Marital status: Married    Spouse name: Not on file  . Number of children: Not on file  . Years of education: Not on file  . Highest education level: Not on file  Occupational History  . Not on file  Social Needs  . Financial resource strain: Not on file  . Food insecurity    Worry: Not on file    Inability: Not on file  . Transportation needs    Medical: Not on file    Non-medical: Not on file  Tobacco Use  . Smoking status: Current Every Day Smoker    Packs/day: 1.00    Years: 40.00    Pack years: 40.00    Types: Cigarettes  . Smokeless tobacco: Never Used  Substance and Sexual Activity  . Alcohol use: No    Alcohol/week: 0.0 standard drinks  . Drug use: No  . Sexual activity: Yes    Birth control/protection: None  Lifestyle  .  Physical activity    Days per week: Not on file    Minutes per session: Not on file  . Stress: Not on file  Relationships  . Social Herbalist on phone: Not on file    Gets together: Not on file    Attends religious service: Not on file    Active member of club or organization: Not on file    Attends meetings of clubs or organizations: Not on file    Relationship status: Not on file  . Intimate partner violence    Fear of current or ex partner: Not on file    Emotionally abused: Not on file    Physically abused: Not on file    Forced sexual activity: Not on file  Other Topics Concern  . Not on file  Social History Narrative  . Not on file     Review of Systems: General: negative for chills, fever, night sweats or weight changes.   Cardiovascular: negative for chest pain, dyspnea on exertion, edema, orthopnea, palpitations, paroxysmal nocturnal dyspnea or shortness of breath Dermatological: negative for rash Respiratory: negative for cough or wheezing Urologic: negative for hematuria Abdominal: negative for nausea, vomiting, diarrhea, bright red blood per rectum, melena, or hematemesis Neurologic: negative for visual changes, syncope, or dizziness All other systems reviewed and are otherwise negative except as noted above.    Blood pressure 128/86, pulse 66, temperature (!) 97.2 F (36.2 C), height 5\' 10"  (1.778 m), weight 213 lb 3.2 oz (96.7 kg), SpO2 95 %.  General appearance: alert and no distress Neck: no adenopathy, no carotid bruit, no JVD, supple, symmetrical, trachea midline and thyroid not enlarged, symmetric, no tenderness/mass/nodules Lungs: clear to auscultation bilaterally Heart: regular rate and rhythm, S1, S2 normal, no murmur, click, rub or gallop Extremities: extremities normal, atraumatic, no cyanosis or edema Pulses: 2+ and symmetric Skin: Skin color, texture, turgor normal. No rashes or lesions Neurologic: Alert and oriented X 3, normal strength and tone. Normal symmetric reflexes. Normal coordination and gait  EKG sinus bradycardia at 59 without ST or T wave changes.  Personally reviewed this EKG.  ASSESSMENT AND PLAN:   Abdominal aortic aneurysm (HCC) Recent abdominal CTA performed 11/26/2018 revealed an infrarenal abdominal like aneurysm measuring 4.6 cm followed by Jeremy Mclean.  Essential hypertension History of essential hypertension with blood pressure measured today at 128/86.  He is on amlodipine and lisinopril.  Iliac artery aneurysm [facility]) Jeremy Mclean has a right iliac artery aneurysm measuring 4.2 cm.  He is scheduled to have this repaired by Jeremy Mclean was referred for preoperative clearance.  His cardiovascular risk factors are notable for 50 pack years of tobacco use and  treated hypertension.  I am going to get a pharmacologic Myoview stress test to risk stratify him.      Jeremy Harp MD FACP,FACC,FAHA, [facility] 12/14/2018 11:52 AM

## 2018-12-25 ENCOUNTER — Encounter: Payer: Self-pay | Admitting: *Deleted

## 2018-12-25 ENCOUNTER — Ambulatory Visit (INDEPENDENT_AMBULATORY_CARE_PROVIDER_SITE_OTHER): Payer: Medicare HMO | Admitting: Vascular Surgery

## 2018-12-25 ENCOUNTER — Telehealth (HOSPITAL_COMMUNITY): Payer: Self-pay

## 2018-12-25 ENCOUNTER — Encounter: Payer: Self-pay | Admitting: Vascular Surgery

## 2018-12-25 ENCOUNTER — Other Ambulatory Visit: Payer: Self-pay

## 2018-12-25 VITALS — BP 135/90 | HR 70 | Temp 97.4°F | Resp 20 | Ht 70.0 in | Wt 213.0 lb

## 2018-12-25 DIAGNOSIS — I714 Abdominal aortic aneurysm, without rupture: Secondary | ICD-10-CM | POA: Diagnosis not present

## 2018-12-25 DIAGNOSIS — I723 Aneurysm of iliac artery: Secondary | ICD-10-CM | POA: Diagnosis not present

## 2018-12-25 DIAGNOSIS — I713 Abdominal aortic aneurysm, ruptured, unspecified: Secondary | ICD-10-CM

## 2018-12-25 NOTE — H&P (View-Only) (Signed)
Vascular and Vein Specialist of [facility]  Patient name: Jeremy Mclean MRN: [id number] DOB: March 02, 1950 Sex: male  REASON FOR VISIT: Discuss planned open aneurysm repair  HPI: Jeremy Mclean is a 68 y.o. male here today with his wife for continued discussion of aneurysm repair.  He has had continued expansion of his aorta and right common iliac artery aneurysm.  His right common iliac artery aneurysm is now at 3.5 cm.  He reports that he is quite nervous about this and feels that he is always anxious about doing anything for fear of causing a rupture.  He is scheduled for Myoview to rule out any asymptomatic coronary disease on 12/27/2018.  He has had no prior intra-abdominal surgery.  Past Medical History:  Diagnosis Date  . AAA (abdominal aortic aneurysm) ([place])   . Anxiety   . Arthritis   . COPD (chronic obstructive pulmonary disease) ([place])   . Depression   . Diverticulitis   . GERD (gastroesophageal reflux disease)   . Hypertension   . Kidney stone     Family History  Problem Relation Age of Onset  . Diabetes Mother   . Cancer Father   . Cancer Other   . Diabetes Other     SOCIAL HISTORY: Social History   Tobacco Use  . Smoking status: Current Every Day Smoker    Packs/day: 1.00    Years: 40.00    Pack years: 40.00    Types: Cigarettes  . Smokeless tobacco: Never Used  Substance Use Topics  . Alcohol use: No    Alcohol/week: 0.0 standard drinks    Allergies  Allergen Reactions  . Contrast Media [Iodinated Diagnostic Agents] Other (See Comments)    Burning, needs pre meds  . Iohexol Hives, Itching and Other (See Comments)    Burning. Needs pre-meds   . Prednisone Other (See Comments)    Agitation and hyperactivity.  Per patient, he can take small doses for IV Dye Prep.    Current Outpatient Medications  Medication Sig Dispense Refill  . albuterol (PROVENTIL HFA;VENTOLIN HFA) 108 (90 Base) MCG/ACT inhaler Inhale 1-2  puffs into the lungs every 6 (six) hours as needed for wheezing or shortness of breath.    Marland Kitchen amLODipine (NORVASC) 10 MG tablet TK 1 T PO QD  4  . BREO ELLIPTA 200-25 MCG/INH AEPB Inhale 1 puff into the lungs daily.  12  . diazepam (VALIUM) 5 MG tablet Take 5 mg by mouth every 6 (six) hours as needed for anxiety. Anxiety    . diphenhydrAMINE (BENADRYL) 50 MG capsule Take 50 mg by mouth 1 hour prior to your procedure. 1 capsule 0  . lisinopril (PRINIVIL,ZESTRIL) 40 MG tablet TK 1 T PO QD  4  . pantoprazole (PROTONIX) 40 MG tablet Take 1 tablet (40 mg total) by mouth 2 (two) times daily before a meal. 60 tablet 2  . predniSONE (DELTASONE) 50 MG tablet One tablet (50mg ) 13 hours prior to procedure; one tablet (50mg ) 7 hours prior to procedure and then one tablet (50 mg) one hour prior to procedure. 3 tablet 0  . traMADol (ULTRAM) 50 MG tablet Take 50 mg by mouth every 6 (six) hours as needed for moderate pain.      No current facility-administered medications for this visit.    Facility-Administered Medications Ordered in Other Visits  Medication Dose Route Frequency Provider Last Rate Last Dose  . rabies immune globulin (HYPERAB) injection 1,720 Units  1,720 Units Intramuscular Once Jennye Boroughs  M, PA-C      . rabies vaccine (RABAVERT/IMOVAX) injection 1 mL  1 mL Intramuscular Once Duaine Dredge, PA-C        REVIEW OF SYSTEMS:  [X]  denotes positive finding, [ ]  denotes negative finding Cardiac  Comments:  Chest pain or chest pressure:    Shortness of breath upon exertion:    Short of breath when lying flat:    Irregular heart rhythm:        Vascular    Pain in calf, thigh, or hip brought on by ambulation:    Pain in feet at night that wakes you up from your sleep:     Blood clot in your veins:    Leg swelling:           PHYSICAL EXAM: Vitals:   12/25/18 1546  BP: 135/90  Pulse: 70  Resp: 20  Temp: (!) 97.4 F (36.3 C)  SpO2: 95%  Weight: 213 lb (96.6 kg)  Height: 5'  10" (1.778 m)    GENERAL: The patient is a well-nourished male, in no acute distress. The vital signs are documented above. CARDIOVASCULAR: 2+ radial and 2+ femoral pulses bilaterally PULMONARY: There is good air exchange  MUSCULOSKELETAL: There are no major deformities or cyanosis. NEUROLOGIC: No focal weakness or paresthesias are detected. SKIN: There are no ulcers or rashes noted. PSYCHIATRIC: The patient has a normal affect.  DATA:  Reviewed his CT scan with the patient and his wife.  This does show a 4.4 cm infrarenal aneurysm and 3.5 cm right common iliac artery aneurysm and 2.1 cm ectasia of his left common iliac artery.  He does not have any aneurysmal change in his external or internal iliac arteries bilaterally.  MEDICAL ISSUES: I discussed options with the patient.  He is not a candidate for stent graft due to angulation and extension of his aneurysm to the level of the renal arteries.  I have recommended open repair.  Described the procedure including 1 to 2-day expected ICU stay and 5 to 7-day hospitalization.  Also explained potential complications of morbidity and mortality and expected his mortality would be acceptable in the 2% range.  He wishes to proceed with surgery as soon as possible and we will coordinate this after cardiac clearance    Rosetta Posner, MD [facility] Vascular and Vein Specialists of [facility] Tel [phone] Pager [phone]

## 2018-12-25 NOTE — Progress Notes (Signed)
Vascular and Vein Specialist of [facility]  Patient name: Jeremy Mclean MRN: [id number] DOB: February 02, 1951 Sex: male  REASON FOR VISIT: Discuss planned open aneurysm repair  HPI: Jeremy Mclean is a 68 y.o. male here today with his wife for continued discussion of aneurysm repair.  He has had continued expansion of his aorta and right common iliac artery aneurysm.  His right common iliac artery aneurysm is now at 3.5 cm.  He reports that he is quite nervous about this and feels that he is always anxious about doing anything for fear of causing a rupture.  He is scheduled for Myoview to rule out any asymptomatic coronary disease on 12/27/2018.  He has had no prior intra-abdominal surgery.  Past Medical History:  Diagnosis Date  . AAA (abdominal aortic aneurysm) ([place])   . Anxiety   . Arthritis   . COPD (chronic obstructive pulmonary disease) ([place])   . Depression   . Diverticulitis   . GERD (gastroesophageal reflux disease)   . Hypertension   . Kidney stone     Family History  Problem Relation Age of Onset  . Diabetes Mother   . Cancer Father   . Cancer Other   . Diabetes Other     SOCIAL HISTORY: Social History   Tobacco Use  . Smoking status: Current Every Day Smoker    Packs/day: 1.00    Years: 40.00    Pack years: 40.00    Types: Cigarettes  . Smokeless tobacco: Never Used  Substance Use Topics  . Alcohol use: No    Alcohol/week: 0.0 standard drinks    Allergies  Allergen Reactions  . Contrast Media [Iodinated Diagnostic Agents] Other (See Comments)    Burning, needs pre meds  . Iohexol Hives, Itching and Other (See Comments)    Burning. Needs pre-meds   . Prednisone Other (See Comments)    Agitation and hyperactivity.  Per patient, he can take small doses for IV Dye Prep.    Current Outpatient Medications  Medication Sig Dispense Refill  . albuterol (PROVENTIL HFA;VENTOLIN HFA) 108 (90 Base) MCG/ACT inhaler Inhale 1-2  puffs into the lungs every 6 (six) hours as needed for wheezing or shortness of breath.    Marland Kitchen amLODipine (NORVASC) 10 MG tablet TK 1 T PO QD  4  . BREO ELLIPTA 200-25 MCG/INH AEPB Inhale 1 puff into the lungs daily.  12  . diazepam (VALIUM) 5 MG tablet Take 5 mg by mouth every 6 (six) hours as needed for anxiety. Anxiety    . diphenhydrAMINE (BENADRYL) 50 MG capsule Take 50 mg by mouth 1 hour prior to your procedure. 1 capsule 0  . lisinopril (PRINIVIL,ZESTRIL) 40 MG tablet TK 1 T PO QD  4  . pantoprazole (PROTONIX) 40 MG tablet Take 1 tablet (40 mg total) by mouth 2 (two) times daily before a meal. 60 tablet 2  . predniSONE (DELTASONE) 50 MG tablet One tablet (50mg ) 13 hours prior to procedure; one tablet (50mg ) 7 hours prior to procedure and then one tablet (50 mg) one hour prior to procedure. 3 tablet 0  . traMADol (ULTRAM) 50 MG tablet Take 50 mg by mouth every 6 (six) hours as needed for moderate pain.      No current facility-administered medications for this visit.    Facility-Administered Medications Ordered in Other Visits  Medication Dose Route Frequency Provider Last Rate Last Dose  . rabies immune globulin (HYPERAB) injection 1,720 Units  1,720 Units Intramuscular Once Jennye Boroughs  M, PA-C      . rabies vaccine (RABAVERT/IMOVAX) injection 1 mL  1 mL Intramuscular Once Duaine Dredge, PA-C        REVIEW OF SYSTEMS:  [X]  denotes positive finding, [ ]  denotes negative finding Cardiac  Comments:  Chest pain or chest pressure:    Shortness of breath upon exertion:    Short of breath when lying flat:    Irregular heart rhythm:        Vascular    Pain in calf, thigh, or hip brought on by ambulation:    Pain in feet at night that wakes you up from your sleep:     Blood clot in your veins:    Leg swelling:           PHYSICAL EXAM: Vitals:   12/25/18 1546  BP: 135/90  Pulse: 70  Resp: 20  Temp: (!) 97.4 F (36.3 C)  SpO2: 95%  Weight: 213 lb (96.6 kg)  Height: 5'  10" (1.778 m)    GENERAL: The patient is a well-nourished male, in no acute distress. The vital signs are documented above. CARDIOVASCULAR: 2+ radial and 2+ femoral pulses bilaterally PULMONARY: There is good air exchange  MUSCULOSKELETAL: There are no major deformities or cyanosis. NEUROLOGIC: No focal weakness or paresthesias are detected. SKIN: There are no ulcers or rashes noted. PSYCHIATRIC: The patient has a normal affect.  DATA:  Reviewed his CT scan with the patient and his wife.  This does show a 4.4 cm infrarenal aneurysm and 3.5 cm right common iliac artery aneurysm and 2.1 cm ectasia of his left common iliac artery.  He does not have any aneurysmal change in his external or internal iliac arteries bilaterally.  MEDICAL ISSUES: I discussed options with the patient.  He is not a candidate for stent graft due to angulation and extension of his aneurysm to the level of the renal arteries.  I have recommended open repair.  Described the procedure including 1 to 2-day expected ICU stay and 5 to 7-day hospitalization.  Also explained potential complications of morbidity and mortality and expected his mortality would be acceptable in the 2% range.  He wishes to proceed with surgery as soon as possible and we will coordinate this after cardiac clearance    Rosetta Posner, MD [facility] Vascular and Vein Specialists of [facility] Tel [phone] Pager [phone]

## 2018-12-25 NOTE — Telephone Encounter (Signed)
Encounter complete. 

## 2018-12-26 ENCOUNTER — Telehealth (HOSPITAL_COMMUNITY): Payer: Self-pay

## 2018-12-26 ENCOUNTER — Other Ambulatory Visit: Payer: Self-pay | Admitting: *Deleted

## 2018-12-26 NOTE — Telephone Encounter (Signed)
Encounter complete. 

## 2018-12-27 ENCOUNTER — Ambulatory Visit (HOSPITAL_COMMUNITY)
Admission: RE | Admit: 2018-12-27 | Discharge: 2018-12-27 | Disposition: A | Discharge status: Home / Self Care | Payer: Medicare HMO | Source: Ambulatory Visit | Attending: Cardiology | Admitting: Cardiology

## 2018-12-27 ENCOUNTER — Other Ambulatory Visit: Payer: Self-pay

## 2018-12-27 DIAGNOSIS — I10 Essential (primary) hypertension: Secondary | ICD-10-CM | POA: Insufficient documentation

## 2018-12-27 DIAGNOSIS — Z01818 Encounter for other preprocedural examination: Secondary | ICD-10-CM | POA: Insufficient documentation

## 2018-12-27 DIAGNOSIS — I713 Abdominal aortic aneurysm, ruptured: Secondary | ICD-10-CM | POA: Insufficient documentation

## 2018-12-27 DIAGNOSIS — J449 Chronic obstructive pulmonary disease, unspecified: Secondary | ICD-10-CM | POA: Diagnosis not present

## 2018-12-27 DIAGNOSIS — Z79899 Other long term (current) drug therapy: Secondary | ICD-10-CM | POA: Diagnosis not present

## 2018-12-27 DIAGNOSIS — F17210 Nicotine dependence, cigarettes, uncomplicated: Secondary | ICD-10-CM | POA: Insufficient documentation

## 2018-12-27 DIAGNOSIS — Z13220 Encounter for screening for lipoid disorders: Secondary | ICD-10-CM | POA: Diagnosis not present

## 2018-12-27 MED ORDER — TECHNETIUM TC 99M TETROFOSMIN IV KIT
31.9000 | PACK | Freq: Once | INTRAVENOUS | Status: AC | PRN
  Administered 2018-12-27: 31.9 via INTRAVENOUS
  Filled 2018-12-27: qty 32

## 2018-12-27 MED ORDER — TECHNETIUM TC 99M TETROFOSMIN IV KIT
10.9000 | PACK | Freq: Once | INTRAVENOUS | Status: AC | PRN
  Administered 2018-12-27: 10.9 via INTRAVENOUS
  Filled 2018-12-27: qty 11

## 2018-12-27 MED ORDER — REGADENOSON 0.4 MG/5ML IV SOLN
0.4000 mg | Freq: Once | INTRAVENOUS | Status: AC
  Administered 2018-12-27: 0.4 mg via INTRAVENOUS

## 2018-12-28 ENCOUNTER — Telehealth: Payer: Self-pay | Admitting: *Deleted

## 2018-12-28 DIAGNOSIS — E7800 Pure hypercholesterolemia, unspecified: Secondary | ICD-10-CM

## 2018-12-28 LAB — MYOCARDIAL PERFUSION IMAGING
LV dias vol: 101 mL (ref 62–150)
LV sys vol: 41 mL
Peak HR: 83 {beats}/min
Rest HR: 63 {beats}/min
SDS: 4
SRS: 5
SSS: 9
TID: 1.22

## 2018-12-28 LAB — HEPATIC FUNCTION PANEL
ALT: 19 IU/L (ref 0–44)
AST: 24 IU/L (ref 0–40)
Albumin: 4.3 g/dL (ref 3.8–4.8)
Alkaline Phosphatase: 64 IU/L (ref 39–117)
Bilirubin Total: 0.3 mg/dL (ref 0.0–1.2)
Bilirubin, Direct: 0.09 mg/dL (ref 0.00–0.40)
Total Protein: 7.1 g/dL (ref 6.0–8.5)

## 2018-12-28 LAB — LIPID PANEL
Chol/HDL Ratio: 6.2 ratio — ABNORMAL HIGH (ref 0.0–5.0)
Cholesterol, Total: 246 mg/dL — ABNORMAL HIGH (ref 100–199)
HDL: 40 mg/dL (ref >39)
LDL Chol Calc (NIH): 176 mg/dL — ABNORMAL HIGH (ref 0–99)
Triglycerides: 163 mg/dL — ABNORMAL HIGH (ref 0–149)
VLDL Cholesterol Cal: 30 mg/dL (ref 5–40)

## 2018-12-28 MED ORDER — ATORVASTATIN CALCIUM 40 MG PO TABS: 40.0000 mg | ORAL_TABLET | Freq: Every day | ORAL | 3 refills | Status: DC

## 2018-12-28 NOTE — Telephone Encounter (Addendum)
Spoke with pt, aware of results. New script sent to the pharmacy and Lab orders mailed to the pt.   ----- Message from Lorretta Harp, MD sent at 12/28/2018  1:26 PM EST ----- Severely elevated LDL of 176.  He does have abdominal aortic aneurysm.  Add atorvastatin 40 mg a day and recheck in 2 months

## 2019-01-09 ENCOUNTER — Telehealth: Payer: Self-pay | Admitting: Cardiovascular Disease

## 2019-01-09 NOTE — Telephone Encounter (Signed)
Okay to stop atorvastatin. Please schedule appt with Lipid clinic (pharmacist) to discuss therapy options.  LDL very elevated and need treatment.  Noted pre-admit for surgery on 11/25. Okay to schedule appt for December.

## 2019-01-09 NOTE — Telephone Encounter (Signed)
Pts wife states that patient can not take any statins. She says he does not tolerate them due to them causing a decrease in platelets. Pt has been told when he was in the hospital in the past not to take any statins. Pts wife would like Dr. Gwenlyn Found to stop the Lipitor and start a new medication instead.

## 2019-01-09 NOTE — Telephone Encounter (Signed)
Spoke with the patients wife and advised her of [facility] recommendation. Pt will stop statin. Pt expecting call to schedule appt with the Lipid clinic.

## 2019-01-09 NOTE — Telephone Encounter (Signed)
Pt's wife calling stating that Dr. Gwenlyn Found started pt on Atorvastatin and pt is unable to take this medication. Wife would like to know if there is something else other than a statin that pt could take. Wife would like a call back concerning this matter. Please address

## 2019-01-10 ENCOUNTER — Telehealth: Payer: Self-pay | Admitting: *Deleted

## 2019-01-10 ENCOUNTER — Encounter: Payer: Self-pay | Admitting: Vascular Surgery

## 2019-01-10 NOTE — Telephone Encounter (Signed)
Called to schedule pt for a lipid clinic visit pt indicated that they are about to have a procedure where they will be laid up for while and want their appt as late as possible so I scheduled them for 03/07/19. Pt voiced understanding

## 2019-01-10 NOTE — Telephone Encounter (Signed)
Left message for patient to call and schedule LIPID consult with the Pharm D

## 2019-01-11 ENCOUNTER — Telehealth: Payer: Self-pay | Admitting: *Deleted

## 2019-01-11 NOTE — Pre-Procedure Instructions (Signed)
Merrit Domonkos Eubanks  01/11/2019      [facility] [id number] - [employer] [employer], [place] [place] [address] Phone: [phone] Fax: [phone]    Your procedure is scheduled on Wednesday, November 25  Report to [facility], Main Entrance or Entrance "A" at 5:30 AM                Your surgery or procedure is scheduled for 7:30 A.M.   Call this number if you have problems the morning of surgery: [phone]  This is the number for the Pre- Surgical Desk.        For any other questions, please call [phone], Monday - Friday 8 AM - 4 PM.   Remember:  Do not eat or drink after midnight the evening before surgery.                   Take these medicines the morning of surgery with A SIP OF WATER :              atorvastatin (LIPITOR) Take if needed: traMADol (ULTRAM)  pantoprazole (PROTONIX) albuterol (PROVENTIL HFA;VENTOLIN HFA), please bring it with you.    STOP/ Do Not Start  taking Aspirin, Aspirin Products (Goody Powder, Excedrin Migraine), Ibuprofen (Advil), Naproxen (Aleve), Vitamins and Herbal Products (ie Fish Oil).   Do not wear jewelry, make-up or nail polish.  Do not wear lotions, powders, or perfumes, or deodorant.  Do not shave 48 hours prior to surgery.  Men may shave face and neck.  Do not bring valuables to the hospital.  [facility] is not responsible for any belongings or valuables.  Contacts, dentures or bridgework may not be worn into surgery.  Leave your suitcase in the car.  After surgery it may be brought to your room.  For patients admitted to the hospital, discharge time will be determined by your treatment team.  Special instructions: [facility]- Preparing For Surgery  Before surgery, you can play an important role. Because skin is not sterile, your skin needs to be as free of germs as possible. You can reduce the number of germs on your skin by washing with CHG  (chlorahexidine gluconate) Soap before surgery.  CHG is an antiseptic cleaner which kills germs and bonds with the skin to continue killing germs even after washing.    Oral Hygiene is also important to reduce your risk of infection.  Remember - BRUSH YOUR TEETH THE MORNING OF SURGERY WITH YOUR REGULAR TOOTHPASTE  Please do not use if you have an allergy to CHG or antibacterial soaps. If your skin becomes reddened/irritated stop using the CHG.  Do not shave (including legs and underarms) for at least 48 hours prior to first CHG shower. It is OK to shave your face.  Please follow these instructions carefully.   1. Shower the NIGHT BEFORE SURGERY and the MORNING OF SURGERY with CHG.   2. If you chose to wash your hair, wash your hair first as usual with your normal shampoo.  3. After you shampoo, rinse your hair and body thoroughly to remove the shampoo.  4. Use CHG as you would any other liquid soap. You can apply CHG directly to the skin and wash gently with a scrungie or a clean washcloth.   5. Apply the CHG Soap to your body ONLY FROM THE NECK DOWN.  Do not use  on open wounds or open sores. Avoid contact with your eyes, ears, mouth and genitals (private parts). Wash Face and genitals (private parts)  with your normal soap.  6. Wash thoroughly, paying special attention to the area where your surgery will be performed.  7. Thoroughly rinse your body with warm water from the neck down.  8. DO NOT shower/wash with your normal soap after using and rinsing off the CHG Soap.  9. Pat yourself dry with a CLEAN TOWEL.  10. Wear CLEAN PAJAMAS to bed the night before surgery, wear comfortable clothes the morning of surgery  11. Place CLEAN SHEETS on your bed the night of your first shower and DO NOT SLEEP WITH PETS.  Day of Surgery: Shower as instructed above. Do not apply any deodorants/lotions.  Please wear clean clothes to the hospital/surgery center.   Remember to brush your teeth WITH  YOUR REGULAR TOOTHPASTE.  Please read over the following fact sheets that you were given.

## 2019-01-11 NOTE — Telephone Encounter (Signed)
Biochemist, clinical for surgery finalized today from [facility]. Faxed received. Information given to patient's wife.

## 2019-01-14 ENCOUNTER — Other Ambulatory Visit: Payer: Self-pay

## 2019-01-14 ENCOUNTER — Other Ambulatory Visit (HOSPITAL_COMMUNITY)
Admission: RE | Admit: 2019-01-14 | Discharge: 2019-01-14 | Disposition: A | Discharge status: Home / Self Care | Payer: Medicare HMO | Source: Ambulatory Visit | Attending: Vascular Surgery | Admitting: Vascular Surgery

## 2019-01-14 ENCOUNTER — Encounter (HOSPITAL_COMMUNITY): Payer: Self-pay

## 2019-01-14 ENCOUNTER — Other Ambulatory Visit: Payer: Self-pay | Admitting: *Deleted

## 2019-01-14 ENCOUNTER — Encounter (HOSPITAL_COMMUNITY)
Admission: RE | Admit: 2019-01-14 | Discharge: 2019-01-14 | Disposition: A | Discharge status: Home / Self Care | Payer: Medicare HMO | Source: Ambulatory Visit | Attending: Vascular Surgery | Admitting: Vascular Surgery

## 2019-01-14 DIAGNOSIS — Z978 Presence of other specified devices: Secondary | ICD-10-CM | POA: Diagnosis not present

## 2019-01-14 DIAGNOSIS — F419 Anxiety disorder, unspecified: Secondary | ICD-10-CM | POA: Diagnosis not present

## 2019-01-14 DIAGNOSIS — I714 Abdominal aortic aneurysm, without rupture: Secondary | ICD-10-CM | POA: Diagnosis not present

## 2019-01-14 DIAGNOSIS — J449 Chronic obstructive pulmonary disease, unspecified: Secondary | ICD-10-CM | POA: Diagnosis not present

## 2019-01-14 DIAGNOSIS — Z01812 Encounter for preprocedural laboratory examination: Secondary | ICD-10-CM | POA: Insufficient documentation

## 2019-01-14 DIAGNOSIS — Z20828 Contact with and (suspected) exposure to other viral communicable diseases: Secondary | ICD-10-CM | POA: Diagnosis not present

## 2019-01-14 DIAGNOSIS — Z7951 Long term (current) use of inhaled steroids: Secondary | ICD-10-CM | POA: Diagnosis not present

## 2019-01-14 DIAGNOSIS — I10 Essential (primary) hypertension: Secondary | ICD-10-CM | POA: Diagnosis not present

## 2019-01-14 DIAGNOSIS — Z452 Encounter for adjustment and management of vascular access device: Secondary | ICD-10-CM | POA: Diagnosis not present

## 2019-01-14 DIAGNOSIS — Z4682 Encounter for fitting and adjustment of non-vascular catheter: Secondary | ICD-10-CM | POA: Diagnosis not present

## 2019-01-14 DIAGNOSIS — Z79899 Other long term (current) drug therapy: Secondary | ICD-10-CM | POA: Diagnosis not present

## 2019-01-14 DIAGNOSIS — I700 Atherosclerosis of aorta: Secondary | ICD-10-CM | POA: Diagnosis not present

## 2019-01-14 DIAGNOSIS — Z95828 Presence of other vascular implants and grafts: Secondary | ICD-10-CM | POA: Diagnosis not present

## 2019-01-14 DIAGNOSIS — F17210 Nicotine dependence, cigarettes, uncomplicated: Secondary | ICD-10-CM | POA: Diagnosis not present

## 2019-01-14 DIAGNOSIS — I723 Aneurysm of iliac artery: Secondary | ICD-10-CM | POA: Diagnosis not present

## 2019-01-14 DIAGNOSIS — Z87442 Personal history of urinary calculi: Secondary | ICD-10-CM | POA: Diagnosis not present

## 2019-01-14 DIAGNOSIS — K219 Gastro-esophageal reflux disease without esophagitis: Secondary | ICD-10-CM | POA: Diagnosis not present

## 2019-01-14 DIAGNOSIS — M1990 Unspecified osteoarthritis, unspecified site: Secondary | ICD-10-CM | POA: Diagnosis not present

## 2019-01-14 HISTORY — DX: Personal history of urinary calculi: Z87.442

## 2019-01-14 HISTORY — DX: Other specified disorders of bone, unspecified site: M89.8X9

## 2019-01-14 HISTORY — DX: Dyspnea, unspecified: R06.00

## 2019-01-14 LAB — COMPREHENSIVE METABOLIC PANEL
ALT: 26 U/L (ref 0–44)
AST: 27 U/L (ref 15–41)
Albumin: 4 g/dL (ref 3.5–5.0)
Alkaline Phosphatase: 50 U/L (ref 38–126)
Anion gap: 11 (ref 5–15)
BUN: 14 mg/dL (ref 8–23)
CO2: 23 mmol/L (ref 22–32)
Calcium: 9.6 mg/dL (ref 8.9–10.3)
Chloride: 104 mmol/L (ref 98–111)
Creatinine, Ser: 1.21 mg/dL (ref 0.61–1.24)
GFR calc Af Amer: >60 mL/min (ref >60)
GFR calc non Af Amer: >60 mL/min (ref >60)
Glucose, Bld: 92 mg/dL (ref 70–99)
Potassium: 4.4 mmol/L (ref 3.5–5.1)
Sodium: 138 mmol/L (ref 135–145)
Total Bilirubin: 0.6 mg/dL (ref 0.3–1.2)
Total Protein: 7.4 g/dL (ref 6.5–8.1)

## 2019-01-14 LAB — CBC
HCT: 49 % (ref 39.0–52.0)
Hemoglobin: 16.3 g/dL (ref 13.0–17.0)
MCH: 32.9 pg (ref 26.0–34.0)
MCHC: 33.3 g/dL (ref 30.0–36.0)
MCV: 98.8 fL (ref 80.0–100.0)
Platelets: 302 10*3/uL (ref 150–400)
RBC: 4.96 MIL/uL (ref 4.22–5.81)
RDW: 13.6 % (ref 11.5–15.5)
WBC: 10.4 10*3/uL (ref 4.0–10.5)
nRBC: 0 % (ref 0.0–0.2)

## 2019-01-14 LAB — URINALYSIS, ROUTINE W REFLEX MICROSCOPIC
Bilirubin Urine: NEGATIVE
Glucose, UA: NEGATIVE mg/dL
Hgb urine dipstick: NEGATIVE
Ketones, ur: NEGATIVE mg/dL
Leukocytes,Ua: NEGATIVE
Nitrite: NEGATIVE
Protein, ur: NEGATIVE mg/dL
Specific Gravity, Urine: 1.011 (ref 1.005–1.030)
pH: 7 (ref 5.0–8.0)

## 2019-01-14 LAB — SURGICAL PCR SCREEN
MRSA, PCR: NEGATIVE
Staphylococcus aureus: NEGATIVE

## 2019-01-14 LAB — PROTIME-INR
INR: 0.9 (ref 0.8–1.2)
Prothrombin Time: 12.3 seconds (ref 11.4–15.2)

## 2019-01-14 LAB — APTT: aPTT: 30 seconds (ref 24–36)

## 2019-01-14 LAB — PREPARE RBC (CROSSMATCH)

## 2019-01-14 LAB — ABO/RH: ABO/RH(D): O NEG

## 2019-01-14 LAB — SARS CORONAVIRUS 2 (TAT 6-24 HRS): SARS Coronavirus 2: NEGATIVE

## 2019-01-14 NOTE — Progress Notes (Signed)
PCP - Dr Asencion Noble Cardiologist - Dr. Lorie Phenix  PPM/ICD - denies Device Orders - N/A Rep Notified - N/A  Chest x-ray - N/A EKG - 12/14/2018 Stress Test - 12/27/2018 ECHO - 12/27/2018 Cardiac Cath -   Sleep Study - denies CPAP - N/A  Blood Thinner Instructions: N/A Aspirin Instructions: N/A  ERAS Protcol - No PRE-SURGERY Ensure or G2- N/A  COVID TEST- Scheduled for today after PAT appointment. Patient verbalized understanding of self-quarantine instructions, appointment time and place.  Anesthesia review: YES, pre-op clearance  Patient denies shortness of breath, fever, cough and chest pain at PAT appointment  All instructions explained to the patient, with a verbal understanding of the material. Patient agrees to go over the instructions while at home for a better understanding. Patient also instructed to self quarantine after being tested for COVID-19. The opportunity to ask questions was provided.

## 2019-01-15 ENCOUNTER — Encounter (HOSPITAL_COMMUNITY): Payer: Self-pay

## 2019-01-15 NOTE — Anesthesia Preprocedure Evaluation (Addendum)
Anesthesia Evaluation  Patient identified by MRN, date of birth, ID band Patient awake    Reviewed: Allergy & Precautions, NPO status , Patient's Chart, lab work & pertinent test results  Airway Mallampati: III  TM Distance: >3 FB Neck ROM: Full    Dental  (+) Dental Advisory Given, Poor Dentition   Pulmonary COPD, Current Smoker and Patient abstained from smoking.,    breath sounds clear to auscultation       Cardiovascular hypertension, Pt. on medications + Peripheral Vascular Disease   Rhythm:Regular Rate:Normal  Nuclear stress test 12/27/18: Nuclear stress EF: 60%. The left ventricular ejection fraction is normal (55-65%). There was no ST segment deviation noted during stress. This is a low risk study. There is no evidence of ischemia or previous infarction. The study is normal.   Neuro/Psych negative neurological ROS     GI/Hepatic Neg liver ROS, GERD  ,  Endo/Other  negative endocrine ROS  Renal/GU negative Renal ROS     Musculoskeletal   Abdominal   Peds  Hematology negative hematology ROS (+)   Anesthesia Other Findings   Reproductive/Obstetrics                           Lab Results  Component Value Date   WBC 10.4 01/14/2019   HGB 16.3 01/14/2019   HCT 49.0 01/14/2019   MCV 98.8 01/14/2019   PLT 302 01/14/2019   Lab Results  Component Value Date   CREATININE 1.21 01/14/2019   BUN 14 01/14/2019   NA 138 01/14/2019   K 4.4 01/14/2019   CL 104 01/14/2019   CO2 23 01/14/2019    Anesthesia Physical Anesthesia Plan  ASA: III  Anesthesia Plan: General   Post-op Pain Management:    Induction: Intravenous  PONV Risk Score and Plan: 2 and Dexamethasone, Ondansetron and Treatment may vary due to age or medical condition  Airway Management Planned: Oral ETT  Additional Equipment: Arterial line, CVP and Ultrasound Guidance Line Placement  Intra-op Plan:   Post-operative  Plan: Possible Post-op intubation/ventilation and Extubation in OR  Informed Consent: I have reviewed the patients History and Physical, chart, labs and discussed the procedure including the risks, benefits and alternatives for the proposed anesthesia with the patient or authorized representative who has indicated his/her understanding and acceptance.     Dental advisory given  Plan Discussed with: CRNA  Anesthesia Plan Comments:       Anesthesia Quick Evaluation

## 2019-01-15 NOTE — Progress Notes (Signed)
Anesthesia Chart Review:  Case: [id number] Date/Time: 01/16/19 0715   Procedure: OPEN REPAIR OF ABDOMINAL AORTIC ANEURYSM (N/A )   Anesthesia type: General   Pre-op diagnosis: ABDOMINAL AORTIC ANEURYSM   Location: [facility] OR ROOM 11 / [facility]   Surgeon: Rosetta Posner, MD      DISCUSSION: Patient is a 68 year old male scheduled for the above procedure.  History includes smoking, AAA/bilateral CIA aneurysms, COPD, dyspnea, hypertension, anxiety, diverticulitis, C6-7 ACDF 09/22/09.  Recent normal stress test per Dr. Gwenlyn Found.    COVID-19 test negative 01/14/19.  If no acute changes and I would anticipate that he could proceed as planned.   VS: BP 140/86   Pulse 74   Ht 5\' 10"  (1.778 m)   Wt 97.7 kg   SpO2 95%   BMI 30.91 kg/m   PROVIDERS: Asencion Noble, MD is PCP Quay Burow, MD is cardiologist - see for preoperative evaluation 12/14/18.   LABS: Labs reviewed: Acceptable for surgery. (all labs ordered are listed, but only abnormal results are displayed)  Labs Reviewed  SURGICAL PCR SCREEN  APTT  CBC  COMPREHENSIVE METABOLIC PANEL  PROTIME-INR  URINALYSIS, ROUTINE W REFLEX MICROSCOPIC  PREPARE RBC (CROSSMATCH)  TYPE AND SCREEN  ABO/RH     IMAGES: CTA abd/pelvis 11/26/18: IMPRESSION: 1. 4.6 cm fusiform infrarenal abdominal aortic aneurysm (previously 4.4 cm). Recommend followup by abdomen and pelvis CTA in 6 months, and vascular surgery referral/consultation if not already obtained. This recommendation follows ACR consensus guidelines: White Paper of the ACR Incidental Findings Committee II on Vascular Findings. J Am Coll Radiol 2013; 10:789-794. 2. 3.5 cm right common iliac artery aneurysm (previously 3.2) and 2.1 cm left common iliac aneurysm.   EKG: 12/13/08: SB at 59 bpm   CV: Nuclear stress test 12/27/18:  Nuclear stress EF: 60%. The left ventricular ejection fraction is normal (55-65%).  There was no ST segment deviation noted during stress.  This is a low risk  study. There is no evidence of ischemia or previous infarction  The study is normal.     Past Medical History:  Diagnosis Date  . AAA (abdominal aortic aneurysm) ([place])   . Anxiety   . Arthritis   . COPD (chronic obstructive pulmonary disease) ([place])   . Degenerative disorder of bone   . Depression   . Diverticulitis   . Dyspnea    due to COPD and when walking long distances per patient  . GERD (gastroesophageal reflux disease)   . History of kidney stones   . Hypertension   . Kidney stone     Past Surgical History:  Procedure Laterality Date  . BIOPSY  01/14/2017   Procedure: BIOPSY;  Surgeon: Danie Binder, MD;  Location: [facility] ENDO SUITE;  Service: Endoscopy;;  gastric   . CERVICAL SPINE SURGERY     C6-7 ACDF 09/22/09  . COLONOSCOPY N/A 04/09/2014   Procedure: COLONOSCOPY;  Surgeon: Rogene Houston, MD;  Location: [facility] ENDO SUITE;  Service: Endoscopy;  Laterality: N/A;  730 - moved to 7:30 - Ann to notify pt  . CYSTOSCOPY     with stent  . ESOPHAGOGASTRODUODENOSCOPY N/A 01/14/2017   Procedure: ESOPHAGOGASTRODUODENOSCOPY (EGD);  Surgeon: Danie Binder, MD;  Location: [facility];  Service: Endoscopy;  Laterality: N/A;  . kidney stent      MEDICATIONS: . albuterol (PROVENTIL HFA;VENTOLIN HFA) 108 (90 Base) MCG/ACT inhaler  . amLODipine (NORVASC) 10 MG tablet  . atorvastatin (LIPITOR) 40 MG tablet  . BREO ELLIPTA 200-25 MCG/INH  AEPB  . diazepam (VALIUM) 5 MG tablet  . diphenhydrAMINE (BENADRYL) 25 MG tablet  . diphenhydrAMINE (BENADRYL) 50 MG capsule  . lisinopril (PRINIVIL,ZESTRIL) 40 MG tablet  . pantoprazole (PROTONIX) 40 MG tablet  . predniSONE (DELTASONE) 50 MG tablet  . traMADol (ULTRAM) 50 MG tablet   No current facility-administered medications for this encounter.    . rabies immune globulin (HYPERAB) injection 1,720 Units  . rabies vaccine (RABAVERT/IMOVAX) injection 1 mL  He had not started new medication Lipitor as of 01/14/19. Prednisone dose is a  pre-procedure order.    Myra Gianotti, PA-C Surgical Short Stay/Anesthesiology [facility] Phone [phone] [facility] Phone [phone] 01/15/2019 10:46 AM

## 2019-01-16 ENCOUNTER — Inpatient Hospital Stay (HOSPITAL_COMMUNITY): Payer: Medicare HMO

## 2019-01-16 ENCOUNTER — Encounter (HOSPITAL_COMMUNITY)
Admission: RE | Disposition: A | Discharge status: Home Health | Payer: Self-pay | Source: Ambulatory Visit | Attending: Vascular Surgery

## 2019-01-16 ENCOUNTER — Inpatient Hospital Stay (HOSPITAL_COMMUNITY): Payer: Medicare HMO | Admitting: Vascular Surgery

## 2019-01-16 ENCOUNTER — Inpatient Hospital Stay (HOSPITAL_COMMUNITY)
Admission: RE | Admit: 2019-01-16 | Discharge: 2019-01-21 | DRG: 269 | Disposition: A | Discharge status: Home Health | Payer: Medicare HMO | Attending: Vascular Surgery | Admitting: Vascular Surgery

## 2019-01-16 ENCOUNTER — Encounter (HOSPITAL_COMMUNITY): Payer: Self-pay | Admitting: *Deleted

## 2019-01-16 ENCOUNTER — Inpatient Hospital Stay (HOSPITAL_COMMUNITY): Payer: Medicare HMO | Admitting: Anesthesiology

## 2019-01-16 ENCOUNTER — Other Ambulatory Visit: Payer: Self-pay

## 2019-01-16 DIAGNOSIS — Z7951 Long term (current) use of inhaled steroids: Secondary | ICD-10-CM

## 2019-01-16 DIAGNOSIS — Z79899 Other long term (current) drug therapy: Secondary | ICD-10-CM | POA: Diagnosis not present

## 2019-01-16 DIAGNOSIS — I723 Aneurysm of iliac artery: Secondary | ICD-10-CM | POA: Diagnosis present

## 2019-01-16 DIAGNOSIS — F17210 Nicotine dependence, cigarettes, uncomplicated: Secondary | ICD-10-CM | POA: Diagnosis present

## 2019-01-16 DIAGNOSIS — Z87442 Personal history of urinary calculi: Secondary | ICD-10-CM

## 2019-01-16 DIAGNOSIS — M1990 Unspecified osteoarthritis, unspecified site: Secondary | ICD-10-CM | POA: Diagnosis present

## 2019-01-16 DIAGNOSIS — K219 Gastro-esophageal reflux disease without esophagitis: Secondary | ICD-10-CM | POA: Diagnosis present

## 2019-01-16 DIAGNOSIS — F419 Anxiety disorder, unspecified: Secondary | ICD-10-CM | POA: Diagnosis present

## 2019-01-16 DIAGNOSIS — I10 Essential (primary) hypertension: Secondary | ICD-10-CM | POA: Diagnosis present

## 2019-01-16 DIAGNOSIS — I714 Abdominal aortic aneurysm, without rupture, unspecified: Secondary | ICD-10-CM | POA: Diagnosis present

## 2019-01-16 DIAGNOSIS — Z95828 Presence of other vascular implants and grafts: Secondary | ICD-10-CM

## 2019-01-16 DIAGNOSIS — Z20828 Contact with and (suspected) exposure to other viral communicable diseases: Secondary | ICD-10-CM | POA: Diagnosis present

## 2019-01-16 HISTORY — PX: ABDOMINAL AORTIC ANEURYSM REPAIR: SHX42

## 2019-01-16 LAB — POCT I-STAT 7, (LYTES, BLD GAS, ICA,H+H)
Acid-Base Excess: 2 mmol/L (ref 0.0–2.0)
Bicarbonate: 28.7 mmol/L — ABNORMAL HIGH (ref 20.0–28.0)
Calcium, Ion: 1.22 mmol/L (ref 1.15–1.40)
HCT: 42 % (ref 39.0–52.0)
Hemoglobin: 14.3 g/dL (ref 13.0–17.0)
O2 Saturation: 100 %
Patient temperature: 35.7
Potassium: 4.6 mmol/L (ref 3.5–5.1)
Sodium: 138 mmol/L (ref 135–145)
TCO2: 30 mmol/L (ref 22–32)
pCO2 arterial: 49.2 mmHg — ABNORMAL HIGH (ref 32.0–48.0)
pH, Arterial: 7.368 (ref 7.350–7.450)
pO2, Arterial: 269 mmHg — ABNORMAL HIGH (ref 83.0–108.0)

## 2019-01-16 LAB — BASIC METABOLIC PANEL
Anion gap: 7 (ref 5–15)
BUN: 11 mg/dL (ref 8–23)
CO2: 23 mmol/L (ref 22–32)
Calcium: 7.8 mg/dL — ABNORMAL LOW (ref 8.9–10.3)
Chloride: 108 mmol/L (ref 98–111)
Creatinine, Ser: 1.15 mg/dL (ref 0.61–1.24)
GFR calc Af Amer: >60 mL/min (ref >60)
GFR calc non Af Amer: >60 mL/min (ref >60)
Glucose, Bld: 142 mg/dL — ABNORMAL HIGH (ref 70–99)
Potassium: 4.7 mmol/L (ref 3.5–5.1)
Sodium: 138 mmol/L (ref 135–145)

## 2019-01-16 LAB — BLOOD GAS, ARTERIAL
Acid-base deficit: 2.6 mmol/L — ABNORMAL HIGH (ref 0.0–2.0)
Bicarbonate: 22.8 mmol/L (ref 20.0–28.0)
FIO2: 36
O2 Saturation: 98.5 %
Patient temperature: 36.7
pCO2 arterial: 46.8 mmHg (ref 32.0–48.0)
pH, Arterial: 7.307 — ABNORMAL LOW (ref 7.350–7.450)
pO2, Arterial: 141 mmHg — ABNORMAL HIGH (ref 83.0–108.0)

## 2019-01-16 LAB — CBC
HCT: 39.7 % (ref 39.0–52.0)
Hemoglobin: 13.5 g/dL (ref 13.0–17.0)
MCH: 33.7 pg (ref 26.0–34.0)
MCHC: 34 g/dL (ref 30.0–36.0)
MCV: 99 fL (ref 80.0–100.0)
Platelets: 209 10*3/uL (ref 150–400)
RBC: 4.01 MIL/uL — ABNORMAL LOW (ref 4.22–5.81)
RDW: 13.7 % (ref 11.5–15.5)
WBC: 19.3 10*3/uL — ABNORMAL HIGH (ref 4.0–10.5)
nRBC: 0 % (ref 0.0–0.2)

## 2019-01-16 LAB — PROTIME-INR
INR: 1.1 (ref 0.8–1.2)
Prothrombin Time: 14.4 seconds (ref 11.4–15.2)

## 2019-01-16 LAB — MAGNESIUM: Magnesium: 2.1 mg/dL (ref 1.7–2.4)

## 2019-01-16 LAB — PREPARE RBC (CROSSMATCH)

## 2019-01-16 LAB — APTT: aPTT: 30 seconds (ref 24–36)

## 2019-01-16 SURGERY — ANEURYSM ABDOMINAL AORTIC REPAIR
Anesthesia: General | Site: Abdomen

## 2019-01-16 MED ORDER — LIDOCAINE 2% (20 MG/ML) 5 ML SYRINGE
INTRAMUSCULAR | Status: AC
  Filled 2019-01-16: qty 5

## 2019-01-16 MED ORDER — SODIUM CHLORIDE 0.9 % IV SOLN
INTRAVENOUS | Status: AC
  Filled 2019-01-16: qty 1.2

## 2019-01-16 MED ORDER — PANTOPRAZOLE SODIUM 40 MG PO TBEC: 40.0000 mg | DELAYED_RELEASE_TABLET | Freq: Every day | ORAL | Status: DC

## 2019-01-16 MED ORDER — FENTANYL CITRATE (PF) 250 MCG/5ML IJ SOLN
INTRAMUSCULAR | Status: AC
  Filled 2019-01-16: qty 10

## 2019-01-16 MED ORDER — PANTOPRAZOLE SODIUM 40 MG PO TBEC
40.0000 mg | DELAYED_RELEASE_TABLET | Freq: Every day | ORAL | Status: DC
  Administered 2019-01-17 – 2019-01-20 (×4): 40 mg via ORAL
  Filled 2019-01-16 (×4): qty 1

## 2019-01-16 MED ORDER — PROPOFOL 10 MG/ML IV BOLUS
INTRAVENOUS | Status: DC | PRN
  Administered 2019-01-16: 150 mg via INTRAVENOUS

## 2019-01-16 MED ORDER — MIDAZOLAM HCL 2 MG/2ML IJ SOLN
INTRAMUSCULAR | Status: AC
  Filled 2019-01-16: qty 2

## 2019-01-16 MED ORDER — SODIUM CHLORIDE 0.9 % IV SOLN
INTRAVENOUS | Status: DC
  Administered 2019-01-16 – 2019-01-20 (×7): via INTRAVENOUS

## 2019-01-16 MED ORDER — DOCUSATE SODIUM 100 MG PO CAPS
100.0000 mg | ORAL_CAPSULE | Freq: Every day | ORAL | Status: DC
  Administered 2019-01-17 – 2019-01-21 (×5): 100 mg via ORAL
  Filled 2019-01-16 (×5): qty 1

## 2019-01-16 MED ORDER — SODIUM CHLORIDE 0.9 % IV SOLN
INTRAVENOUS | Status: DC | PRN
  Administered 2019-01-16: 500 mL

## 2019-01-16 MED ORDER — FLUTICASONE FUROATE-VILANTEROL 200-25 MCG/INH IN AEPB
1.0000 | INHALATION_SPRAY | Freq: Every evening | RESPIRATORY_TRACT | Status: DC
  Administered 2019-01-16: 1 via RESPIRATORY_TRACT
  Filled 2019-01-16: qty 28

## 2019-01-16 MED ORDER — ONDANSETRON HCL 4 MG/2ML IJ SOLN
INTRAMUSCULAR | Status: DC | PRN
  Administered 2019-01-16: 4 mg via INTRAVENOUS

## 2019-01-16 MED ORDER — PANTOPRAZOLE SODIUM 40 MG IV SOLR
40.0000 mg | Freq: Every day | INTRAVENOUS | Status: DC
  Administered 2019-01-16: 40 mg via INTRAVENOUS
  Filled 2019-01-16 (×2): qty 40

## 2019-01-16 MED ORDER — ONDANSETRON HCL 4 MG/2ML IJ SOLN: 4.0000 mg | Freq: Once | INTRAMUSCULAR | Status: DC | PRN

## 2019-01-16 MED ORDER — ALBUTEROL SULFATE (2.5 MG/3ML) 0.083% IN NEBU
2.5000 mg | INHALATION_SOLUTION | Freq: Four times a day (QID) | RESPIRATORY_TRACT | Status: DC | PRN
  Administered 2019-01-17 – 2019-01-18 (×2): 2.5 mg via RESPIRATORY_TRACT
  Filled 2019-01-16 (×2): qty 3

## 2019-01-16 MED ORDER — PROTAMINE SULFATE 10 MG/ML IV SOLN
INTRAVENOUS | Status: AC
  Filled 2019-01-16: qty 5

## 2019-01-16 MED ORDER — METOPROLOL TARTRATE 5 MG/5ML IV SOLN: 2.0000 mg | INTRAVENOUS | Status: DC | PRN

## 2019-01-16 MED ORDER — CEFAZOLIN SODIUM-DEXTROSE 2-4 GM/100ML-% IV SOLN
2.0000 g | Freq: Three times a day (TID) | INTRAVENOUS | Status: AC
  Administered 2019-01-16 – 2019-01-17 (×2): 2 g via INTRAVENOUS
  Filled 2019-01-16 (×2): qty 100

## 2019-01-16 MED ORDER — POTASSIUM CHLORIDE CRYS ER 20 MEQ PO TBCR: 20.0000 meq | EXTENDED_RELEASE_TABLET | Freq: Once | ORAL | Status: DC | PRN

## 2019-01-16 MED ORDER — ONDANSETRON HCL 4 MG/2ML IJ SOLN
INTRAMUSCULAR | Status: AC
  Filled 2019-01-16: qty 2

## 2019-01-16 MED ORDER — HEPARIN SODIUM (PORCINE) 1000 UNIT/ML IJ SOLN
INTRAMUSCULAR | Status: AC
  Filled 2019-01-16: qty 2

## 2019-01-16 MED ORDER — HYDROMORPHONE HCL 1 MG/ML IJ SOLN
INTRAMUSCULAR | Status: AC
  Filled 2019-01-16: qty 2

## 2019-01-16 MED ORDER — HYDRALAZINE HCL 20 MG/ML IJ SOLN: 5.0000 mg | INTRAMUSCULAR | Status: DC | PRN

## 2019-01-16 MED ORDER — EPHEDRINE 5 MG/ML INJ
INTRAVENOUS | Status: AC
  Filled 2019-01-16: qty 10

## 2019-01-16 MED ORDER — PROPOFOL 10 MG/ML IV BOLUS
INTRAVENOUS | Status: AC
  Filled 2019-01-16: qty 20

## 2019-01-16 MED ORDER — CHLORHEXIDINE GLUCONATE CLOTH 2 % EX PADS
6.0000 | MEDICATED_PAD | Freq: Every day | CUTANEOUS | Status: DC
  Administered 2019-01-17 – 2019-01-18 (×2): 6 via TOPICAL

## 2019-01-16 MED ORDER — MIDAZOLAM HCL 2 MG/2ML IJ SOLN
INTRAMUSCULAR | Status: DC | PRN
  Administered 2019-01-16 (×2): 1 mg via INTRAVENOUS

## 2019-01-16 MED ORDER — MAGNESIUM SULFATE 2 GM/50ML IV SOLN: 2.0000 g | Freq: Once | INTRAVENOUS | Status: DC | PRN

## 2019-01-16 MED ORDER — LABETALOL HCL 5 MG/ML IV SOLN: 10.0000 mg | INTRAVENOUS | Status: DC | PRN

## 2019-01-16 MED ORDER — BISACODYL 10 MG RE SUPP
10.0000 mg | Freq: Every day | RECTAL | Status: DC | PRN
  Administered 2019-01-20: 09:00:00 10 mg via RECTAL
  Filled 2019-01-16 (×2): qty 1

## 2019-01-16 MED ORDER — DEXAMETHASONE SODIUM PHOSPHATE 10 MG/ML IJ SOLN
INTRAMUSCULAR | Status: DC | PRN
  Administered 2019-01-16 (×2): 5 mg via INTRAVENOUS

## 2019-01-16 MED ORDER — LIDOCAINE 2% (20 MG/ML) 5 ML SYRINGE
INTRAMUSCULAR | Status: DC | PRN
  Administered 2019-01-16: 60 mg via INTRAVENOUS

## 2019-01-16 MED ORDER — CEFAZOLIN SODIUM-DEXTROSE 2-4 GM/100ML-% IV SOLN
2.0000 g | INTRAVENOUS | Status: AC
  Administered 2019-01-16 (×2): 2 g via INTRAVENOUS
  Filled 2019-01-16: qty 100

## 2019-01-16 MED ORDER — MORPHINE SULFATE (PF) 2 MG/ML IV SOLN
2.0000 mg | INTRAVENOUS | Status: DC | PRN
  Administered 2019-01-16 – 2019-01-17 (×7): 2 mg via INTRAVENOUS
  Filled 2019-01-16 (×7): qty 1

## 2019-01-16 MED ORDER — MANNITOL 25 % IV SOLN
INTRAVENOUS | Status: DC | PRN
  Administered 2019-01-16: 25 g via INTRAVENOUS

## 2019-01-16 MED ORDER — ROCURONIUM BROMIDE 10 MG/ML (PF) SYRINGE
PREFILLED_SYRINGE | INTRAVENOUS | Status: AC
  Filled 2019-01-16: qty 10

## 2019-01-16 MED ORDER — 0.9 % SODIUM CHLORIDE (POUR BTL) OPTIME
TOPICAL | Status: DC | PRN
  Administered 2019-01-16: 3000 mL

## 2019-01-16 MED ORDER — ALBUMIN HUMAN 5 % IV SOLN
INTRAVENOUS | Status: DC | PRN
  Administered 2019-01-16 (×4): via INTRAVENOUS

## 2019-01-16 MED ORDER — GUAIFENESIN-DM 100-10 MG/5ML PO SYRP: 15.0000 mL | ORAL_SOLUTION | ORAL | Status: DC | PRN

## 2019-01-16 MED ORDER — CHLORHEXIDINE GLUCONATE 4 % EX LIQD: 60.0000 mL | Freq: Once | CUTANEOUS | Status: DC

## 2019-01-16 MED ORDER — PHENYLEPHRINE 40 MCG/ML (10ML) SYRINGE FOR IV PUSH (FOR BLOOD PRESSURE SUPPORT)
PREFILLED_SYRINGE | INTRAVENOUS | Status: DC | PRN
  Administered 2019-01-16: 40 ug via INTRAVENOUS

## 2019-01-16 MED ORDER — HYDROMORPHONE HCL 1 MG/ML IJ SOLN
0.2500 mg | INTRAMUSCULAR | Status: DC | PRN
  Administered 2019-01-16 (×4): 0.5 mg via INTRAVENOUS

## 2019-01-16 MED ORDER — LACTATED RINGERS IV SOLN
INTRAVENOUS | Status: DC | PRN
  Administered 2019-01-16 (×2): via INTRAVENOUS

## 2019-01-16 MED ORDER — PANTOPRAZOLE SODIUM 40 MG IV SOLR: 40.0000 mg | Freq: Every day | INTRAVENOUS | Status: DC

## 2019-01-16 MED ORDER — ACETAMINOPHEN 325 MG PO TABS: 325.0000 mg | ORAL_TABLET | ORAL | Status: DC | PRN

## 2019-01-16 MED ORDER — ONDANSETRON HCL 4 MG/2ML IJ SOLN: 4.0000 mg | Freq: Four times a day (QID) | INTRAMUSCULAR | Status: DC | PRN

## 2019-01-16 MED ORDER — PHENYLEPHRINE HCL-NACL 10-0.9 MG/250ML-% IV SOLN
INTRAVENOUS | Status: DC | PRN
  Administered 2019-01-16: 20 ug/min via INTRAVENOUS

## 2019-01-16 MED ORDER — SODIUM CHLORIDE 0.9 % IV SOLN
INTRAVENOUS | Status: DC
  Administered 2019-01-16: 12:00:00 via INTRAVENOUS

## 2019-01-16 MED ORDER — ALBUMIN HUMAN 5 % IV SOLN
INTRAVENOUS | Status: AC
  Filled 2019-01-16: qty 250

## 2019-01-16 MED ORDER — FLUTICASONE FUROATE-VILANTEROL 200-25 MCG/INH IN AEPB
1.0000 | INHALATION_SPRAY | Freq: Every evening | RESPIRATORY_TRACT | Status: DC
  Filled 2019-01-16: qty 28

## 2019-01-16 MED ORDER — PHENOL 1.4 % MT LIQD: 1.0000 | OROMUCOSAL | Status: DC | PRN

## 2019-01-16 MED ORDER — PROTAMINE SULFATE 10 MG/ML IV SOLN
INTRAVENOUS | Status: DC | PRN
  Administered 2019-01-16: 50 mg via INTRAVENOUS

## 2019-01-16 MED ORDER — ALBUMIN HUMAN 5 % IV SOLN
12.5000 g | Freq: Once | INTRAVENOUS | Status: AC
  Administered 2019-01-16: 14:00:00 12.5 g via INTRAVENOUS

## 2019-01-16 MED ORDER — HEPARIN SODIUM (PORCINE) 1000 UNIT/ML IJ SOLN
INTRAMUSCULAR | Status: DC | PRN
  Administered 2019-01-16: 3000 [IU] via INTRAVENOUS
  Administered 2019-01-16: 10000 [IU] via INTRAVENOUS

## 2019-01-16 MED ORDER — SODIUM CHLORIDE 0.9 % IV SOLN
500.0000 mL | Freq: Once | INTRAVENOUS | Status: AC | PRN
  Administered 2019-01-16: 14:00:00 500 mL via INTRAVENOUS

## 2019-01-16 MED ORDER — ACETAMINOPHEN 325 MG RE SUPP: 325.0000 mg | RECTAL | Status: DC | PRN

## 2019-01-16 MED ORDER — METOPROLOL TARTRATE 5 MG/5ML IV SOLN
2.5000 mg | Freq: Four times a day (QID) | INTRAVENOUS | Status: DC
  Administered 2019-01-16 – 2019-01-21 (×16): 2.5 mg via INTRAVENOUS
  Filled 2019-01-16 (×16): qty 5

## 2019-01-16 MED ORDER — SUGAMMADEX SODIUM 200 MG/2ML IV SOLN
INTRAVENOUS | Status: DC | PRN
  Administered 2019-01-16: 200 mg via INTRAVENOUS

## 2019-01-16 MED ORDER — PHENYLEPHRINE 40 MCG/ML (10ML) SYRINGE FOR IV PUSH (FOR BLOOD PRESSURE SUPPORT)
PREFILLED_SYRINGE | INTRAVENOUS | Status: AC
  Filled 2019-01-16: qty 10

## 2019-01-16 MED ORDER — FENTANYL CITRATE (PF) 250 MCG/5ML IJ SOLN
INTRAMUSCULAR | Status: DC | PRN
  Administered 2019-01-16 (×5): 50 ug via INTRAVENOUS
  Administered 2019-01-16: 150 ug via INTRAVENOUS
  Administered 2019-01-16: 100 ug via INTRAVENOUS

## 2019-01-16 MED ORDER — DEXAMETHASONE SODIUM PHOSPHATE 10 MG/ML IJ SOLN
INTRAMUSCULAR | Status: AC
  Filled 2019-01-16: qty 1

## 2019-01-16 MED ORDER — LACTATED RINGERS IV SOLN
INTRAVENOUS | Status: DC | PRN
  Administered 2019-01-16: 07:00:00 via INTRAVENOUS

## 2019-01-16 MED ORDER — ROCURONIUM BROMIDE 10 MG/ML (PF) SYRINGE
PREFILLED_SYRINGE | INTRAVENOUS | Status: DC | PRN
  Administered 2019-01-16: 50 mg via INTRAVENOUS
  Administered 2019-01-16: 30 mg via INTRAVENOUS
  Administered 2019-01-16: 50 mg via INTRAVENOUS

## 2019-01-16 SURGICAL SUPPLY — 43 items
ADH SKN CLS APL DERMABOND .7 (GAUZE/BANDAGES/DRESSINGS) ×1
CANISTER SUCT 3000ML PPV (MISCELLANEOUS) ×3 IMPLANT
CANNULA VESSEL 3MM 2 BLNT TIP (CANNULA) ×5 IMPLANT
CLIP LIGATING EXTRA MED SLVR (CLIP) ×3 IMPLANT
CLIP LIGATING EXTRA SM BLUE (MISCELLANEOUS) ×3 IMPLANT
COVER WAND RF STERILE (DRAPES) ×1 IMPLANT
DERMABOND ADVANCED (GAUZE/BANDAGES/DRESSINGS) ×2
DERMABOND ADVANCED .7 DNX12 (GAUZE/BANDAGES/DRESSINGS) IMPLANT
ELECT BLADE 4.0 EZ CLEAN MEGAD (MISCELLANEOUS) ×3
ELECT REM PT RETURN 9FT ADLT (ELECTROSURGICAL) ×3
ELECTRODE BLDE 4.0 EZ CLN MEGD (MISCELLANEOUS) IMPLANT
ELECTRODE REM PT RTRN 9FT ADLT (ELECTROSURGICAL) ×1 IMPLANT
FELT TEFLON 1X6 (MISCELLANEOUS) ×3 IMPLANT
GLOVE BIOGEL PI IND STRL 6.5 (GLOVE) IMPLANT
GLOVE BIOGEL PI INDICATOR 6.5 (GLOVE) ×4
GLOVE SS BIOGEL STRL SZ 7.5 (GLOVE) ×1 IMPLANT
GLOVE SUPERSENSE BIOGEL SZ 7.5 (GLOVE) ×2
GOWN STRL REUS W/ TWL LRG LVL3 (GOWN DISPOSABLE) ×3 IMPLANT
GOWN STRL REUS W/TWL LRG LVL3 (GOWN DISPOSABLE) ×9
GRAFT HEMASHIELD 18X9MM (Vascular Products) ×2 IMPLANT
INSERT FOGARTY 61MM (MISCELLANEOUS) ×6 IMPLANT
INSERT FOGARTY SM (MISCELLANEOUS) ×10 IMPLANT
KIT BASIN OR (CUSTOM PROCEDURE TRAY) ×3 IMPLANT
KIT TURNOVER KIT B (KITS) ×3 IMPLANT
NS IRRIG 1000ML POUR BTL (IV SOLUTION) ×6 IMPLANT
PACK AORTA (CUSTOM PROCEDURE TRAY) ×3 IMPLANT
PAD ARMBOARD 7.5X6 YLW CONV (MISCELLANEOUS) ×6 IMPLANT
SPONGE LAP 18X18 RF (DISPOSABLE) IMPLANT
STAPLER VISISTAT 35W (STAPLE) IMPLANT
SUT ETHIBOND 5 LR DA (SUTURE) IMPLANT
SUT PDS AB 1 TP1 54 (SUTURE) ×6 IMPLANT
SUT PROLENE 3 0 SH1 36 (SUTURE) ×6 IMPLANT
SUT PROLENE 5 0 C 1 24 (SUTURE) IMPLANT
SUT PROLENE 5 0 C 1 36 (SUTURE) ×6 IMPLANT
SUT SILK 2 0 SH CR/8 (SUTURE) ×3 IMPLANT
SUT VIC AB 2-0 CT1 36 (SUTURE) ×6 IMPLANT
SUT VIC AB 3-0 SH 27 (SUTURE) ×3
SUT VIC AB 3-0 SH 27X BRD (SUTURE) ×2 IMPLANT
SUT VIC AB 4-0 PS2 18 (SUTURE) ×4 IMPLANT
TOWEL GREEN STERILE (TOWEL DISPOSABLE) ×3 IMPLANT
TOWEL SURG RFD BLUE STRL DISP (DISPOSABLE) ×4 IMPLANT
TRAY FOLEY MTR SLVR 16FR STAT (SET/KITS/TRAYS/PACK) ×3 IMPLANT
WATER STERILE IRR 1000ML POUR (IV SOLUTION) ×6 IMPLANT

## 2019-01-16 NOTE — Op Note (Signed)
OPERATIVE REPORT  DATE OF SURGERY: 01/16/2019  PATIENT: Jeremy Mclean, 68 y.o. male MRN: [id number]  DOB: September 17, 1950  PRE-OPERATIVE DIAGNOSIS: Abdominal and bilateral common iliac artery aneurysms  POST-OPERATIVE DIAGNOSIS:  Same  PROCEDURE: Aorto by iliac bypass 18 x 9 Hemashield.  Anastomosis to left common iliac and right external iliac artery  SURGEON:  Curt Jews, M.D.  PHYSICIAN ASSISTANT: Liana Crocker, PA-C  ANESTHESIA: General  EBL: per anesthesia record  Total I/O In: [id number] [I.V.:2000; Blood:712; IV Piggyback:750] Out: [id number] [Urine:200; Blood:1640]  BLOOD ADMINISTERED: none  DRAINS: none  SPECIMEN: none  COUNTS CORRECT:  YES  PATIENT DISPOSITION:  PACU - hemodynamically stable  PROCEDURE DETAILS: Patient was taken up and placed to position with area of the abdomen was prepped draped you sterile fashion.  Incision was made from the level of xiphoid to the pubis and carried down through the midline fascia with electrocautery.  The peritoneum was entered.  The small bowel and large bowel were normal.  The Omni-Tract retractor was used for exposure.  The transverse colon omentum reflected superiorly and the small bowel was reflected to the right.  The retroperitoneum was opened over the aorta and the the artery was exposed to the level of the renal vein.  The aorta was encircled at this level below the level of the renal arteries.  Dissection was continued down over the aortic bifurcation.  There was a great deal of tortuosity in the common iliac arteries.  The left common iliac artery was dissected down in the pelvis to the level of the iliac bifurcation.  On the right the patient had a large iliac artery aneurysm and dissection was continued down to the iliac bifurcation.  The external and internal iliac arteries were individually controlled.  The patient was given 25 g of mannitol and 10,000 intravenous heparin.  After adequate circulation time the aorta was occluded  with a clamp just below the level of the renal arteries and the distal common iliac artery was occluded on the left with a Henley clamp and the internal and external iliac arteries were occluded on the right with Henley clamps.  The aorta was opened and mural thrombus was removed.  There was brisk backbleeding from the inferior mesenteric artery and this was oversewn at its origin with 2-0 silk figure-of-eight suture.  The aorta was transected below the level of the proximal clamp.  Additional lumbar bleeders were controlled with 2-0 silk figure-of-eight sutures.  The distal common iliac artery was transected on the left for an end-to-end anastomosis.  On the right the external iliac artery was transected and the origin of the right internal iliac artery was exposed.  The 18 x 9 Hemashield graft was brought onto the field.  Using a felt strip for reinforcement the graft was sewn into into the aorta with a running 3-0 Prolene suture.  This anastomosis was tested and found to be adequate.  Next the left limb of the graft was sewn into into the distal common iliac artery on the left with a running 5-0 Prolene suture.  Prior to completion of the anastomosis the usual flushing maneuvers were undertaken.  The anastomosis was completed and flow was restored to the left leg.  The left internal iliac was checked and had excellent backbleeding.  Therefore it was oversewn with a 3 2 layers of 3-0 Prolene suture.  The right limb of the graft was then cut to the appropriate length and was sewn into into the right external  iliac artery with a running 5-0 Prolene suture.  After the usual flushing maneuvers this anastomosis was completed and reflect close restored to the right leg.  The patient had normal femoral pulses bilaterally.  The patient was given 50 mg of protamine to reverse the heparin.  Wounds were irrigated with saline.  The aortic wall was closed over the graft with a running 0 Vicryl suture.  The retroperitoneum was  then closed to exclude the bowel from the graft with a running 0 Vicryl suture.  The small bowel was run its entirety and found to be without injury was placed back in the pelvis.  Transverse colon omentum were placed over this.  The midline fascia was closed with a #1 PDS suture beginning proximally distally and tying in the middle.  The skin was closed with 3-0 subcuticular Vicryl suture.  Sterile dressing was applied.  The patient had palpable posterior tibial pulses bilaterally.  He was extubated in the operating room was transferred to the recovery room in stable condition   Rosetta Posner, M.D., [facility] 01/16/2019 12:40 PM

## 2019-01-16 NOTE — Progress Notes (Signed)
PT Cancellation Note  Patient Details Name: Jeremy Mclean MRN: [id number] DOB: 09-26-1950   Cancelled Treatment:    Reason Eval/Treat Not Completed: Medical issues which prohibited therapy. PT attempted to see patient for evaluation however RN reports patient on bedrest currently, as wellas being lethargic after recent discharge from PACU. PT will hold evaluation at this time.   Zenaida Niece 01/16/2019, 3:58 PM

## 2019-01-16 NOTE — Anesthesia Procedure Notes (Signed)
Central Venous Catheter Insertion Performed by: Roberts Gaudy, MD, anesthesiologist Start/End11/25/2020 7:00 AM, 01/16/2019 7:10 AM Patient location: Pre-op. Preanesthetic checklist: patient identified, IV checked, site marked, risks and benefits discussed, surgical consent, monitors and equipment checked, pre-op evaluation and timeout performed Position: supine Catheter size: 12 Fr Central line was placed.Triple lumen Procedure performed using ultrasound guided technique. Ultrasound Notes:anatomy identified, needle tip was noted to be adjacent to the nerve/plexus identified, no ultrasound evidence of intravascular and/or intraneural injection and image(s) printed for medical record Attempts: 1 Following insertion, line sutured, dressing applied and Biopatch. Post procedure assessment: blood return through all ports and free fluid flow  Patient tolerated the procedure well with no immediate complications.

## 2019-01-16 NOTE — Interval H&P Note (Signed)
History and Physical Interval Note:  01/16/2019 7:21 AM  Jeremy Mclean  has presented today for surgery, with the diagnosis of ABDOMINAL AORTIC ANEURYSM.  The various methods of treatment have been discussed with the patient and family. After consideration of risks, benefits and other options for treatment, the patient has consented to  Procedure(s): OPEN REPAIR OF ABDOMINAL AORTIC ANEURYSM (N/A) as a surgical intervention.  The patient's history has been reviewed, patient examined, no change in status, stable for surgery.  I have reviewed the patient's chart and labs.  Questions were answered to the patient's satisfaction.     Curt Jews

## 2019-01-16 NOTE — Anesthesia Procedure Notes (Signed)
Procedure Name: Intubation Date/Time: 01/16/2019 7:44 AM Performed by: Alain Marion, CRNA Pre-anesthesia Checklist: Patient identified, Emergency Drugs available, Suction available and Patient being monitored Patient Re-evaluated:Patient Re-evaluated prior to induction Oxygen Delivery Method: Circle System Utilized Preoxygenation: Pre-oxygenation with 100% oxygen Induction Type: IV induction Ventilation: Mask ventilation without difficulty and Oral airway inserted - appropriate to patient size Laryngoscope Size: Sabra Heck and 2 Grade View: Grade I Tube type: Oral Tube size: 7.5 mm Number of attempts: 1 Airway Equipment and Method: Stylet and Oral airway Placement Confirmation: ETT inserted through vocal cords under direct vision,  positive ETCO2 and breath sounds checked- equal and bilateral Secured at: 22 cm Tube secured with: Tape Dental Injury: Teeth and Oropharynx as per pre-operative assessment

## 2019-01-16 NOTE — Discharge Instructions (Signed)
 [facility]  Discharge Instructions   Open Aortic Surgery  Please refer to the following instructions for your post-procedure care. Your surgeon or Physician Assistant will discuss any changes with you.  Activity  Avoid lifting more than eight pounds (a gallon of milk) until after your first post-operative visit. You are encouraged to walk as much as you can. You can slowly return to normal activities but must avoid strenuous activity and heavy lifting until your doctor tells you it's okay. Heavy lifting can hurt the incision and cause a hernia. Avoid activities such as vacuuming or swinging a golf club. It is normal to feel tired for several weeks after your surgery. Do not drive until your doctor gives the okay and you are no longer taking prescription pain medications. It is also normal to have difficulty with sleep habits, eating and bowl movements after surgery. These will go away with time.  Bathing/Showering  Shower daily after you go home. Do not soak in a bathtub, hot tub, or swim until the incision heals.  Incision Care  Shower every day. Clean your incision with mild soap and water. Pat the area dry with a clean towel. You do not need a bandage unless otherwise instructed. Do not apply any ointments or creams to your incision. You may have skin glue on your incision. Do not peel it off. It will come off on its own in about one week. If you have staples or sutures along your incision, they will be removed at your post op appointment.  If you have groin incisions, wash the groin wounds with soap and water daily and pat dry. (No tub bath-only shower)  Then put a dry gauze or washcloth in the groin to keep this area dry to help prevent wound infection.  Do this daily and as needed.  Do not use Vaseline or neosporin on your incisions.  Only use soap and water on your incisions and then protect and keep dry.  Diet  Resume your normal diet. There are no  special food restriction following this procedure. A low fat/low cholesterol diet is recommended for all patients with vascular disease. After your aortic surgery, it's normal to feel full faster than usual and to not feel as hungry as you normally would. You will probably lose weight initially following your surgery. It's best to eat small, frequent meals over the course of the day. Call the office if you find that you are unable to eat even small meals.   In order to heal from your surgery, it is CRITICAL to get adequate nutrition. Your body requires vitamins, minerals, and protein. Vegetables are the best source of vitamins and minerals. If you have pain, you may take over-the-counter pain reliever such as acetaminophen (Tylenol). If you were prescribed a stronger pain medication, please be aware these medication can cause nausea and constipation. Prevent nausea by taking the medication with a snack or meal. Avoid constipation by drinking plenty of fluids and eating foods with a high amount of fiber, such as fruits, vegetables and grains. Take 100mg of the over-the-counter stool softener Colace twice a day as needed to help with constipation. A laxative, such as Milk of Magnesia, may be recommended for you at this time. Do not take a laxative unless your surgeon or P.A. tells you it's OK.  Do not take Tylenol if you are taking stronger pain medications (such as Percocet).  Follow Up  Our office will schedule a follow up   appointment 2-3 weeks after discharge.  Please call us immediately for any of the following conditions    .     Severe or worsening pain in your legs or feet or in your abdomen back or chest. Increased pain, redness drainage (pus) from your incision site. Increased abdominal pain, bloating, nausea, vomiting, or persistent diarrhea. Fever of 101 degrees or higher. Swelling in your leg (s).  Reduce your risk of vascular disease  Stop smoking. If you would like help, call  [facility] at 1-800-QUIT-NOW (1-800-784-8669) or [place] at 336-586-4000. Manage your cholesterol Maintain a desired weight Control your diabetes Keep your blood pressure down  If you have any questions please call the office at [phone].   

## 2019-01-16 NOTE — Anesthesia Postprocedure Evaluation (Signed)
Anesthesia Post Note  Patient: Jeremy Mclean  Procedure(s) Performed: OPEN REPAIR OF ABDOMINAL AORTIC ANEURYSM (N/A Abdomen)     Patient location during evaluation: PACU Anesthesia Type: General Level of consciousness: awake and alert Pain management: pain level controlled Vital Signs Assessment: post-procedure vital signs reviewed and stable Respiratory status: spontaneous breathing, nonlabored ventilation, respiratory function stable and patient connected to nasal cannula oxygen Cardiovascular status: blood pressure returned to baseline and stable Postop Assessment: no apparent nausea or vomiting Anesthetic complications: no    Last Vitals:  Vitals:   01/16/19 1404 01/16/19 1419  BP: 111/83 106/85  Pulse: 64 70  Resp: (!) 6 (!) 9  Temp:    SpO2: 96% 93%    Last Pain:  Vitals:   01/16/19 1419  TempSrc:   PainSc: [name]

## 2019-01-16 NOTE — Transfer of Care (Signed)
Immediate Anesthesia Transfer of Care Note  Patient: Jeremy Mclean  Procedure(s) Performed: OPEN REPAIR OF ABDOMINAL AORTIC ANEURYSM (N/A Abdomen)  Patient Location: PACU  Anesthesia Type:General  Level of Consciousness: awake, alert  and oriented  Airway & Oxygen Therapy: Patient Spontanous Breathing and Patient connected to face mask oxygen  Post-op Assessment: Report given to RN and Post -op Vital signs reviewed and stable  Post vital signs: Reviewed and stable  Last Vitals:  Vitals Value Taken Time  BP 149/87   Temp    Pulse 63 01/16/19 1238  Resp 11 01/16/19 1238  SpO2 100 % 01/16/19 1238  Vitals shown include unvalidated device data.  Last Pain:  Vitals:   01/16/19 0621  TempSrc: Oral  PainSc:       Patients Stated Pain Goal: 4 ([id number])  Complications: No apparent anesthesia complications

## 2019-01-16 NOTE — Progress Notes (Signed)
Patient ID: Jeremy Mclean, male   DOB: 12-05-1950, 68 y.o.   MRN: [id number]  Progress Note    01/16/2019 1:31 PM Day of Surgery  Subjective: Comfortable in PACU.   Vitals:   01/16/19 1304 01/16/19 1319  BP: 128/84 107/71  Pulse: 63 63  Resp: 13 13  Temp:    SpO2: 98% 94%   Physical Exam: Abdomen soft.  Palpable posterior tibial pulses bilaterally.  CBC    Component Value Date/Time   WBC 19.3 (H) 01/16/2019 1225   RBC 4.01 (L) 01/16/2019 1225   HGB 13.5 01/16/2019 1225   HCT 39.7 01/16/2019 1225   PLT 209 01/16/2019 1225   MCV 99.0 01/16/2019 1225   MCH 33.7 01/16/2019 1225   MCHC 34.0 01/16/2019 1225   RDW 13.7 01/16/2019 1225   LYMPHSABS 3.3 01/13/2017 1015   MONOABS 1.8 (H) 01/13/2017 1015   EOSABS 0.4 01/13/2017 1015   BASOSABS 0.0 01/13/2017 1015    BMET    Component Value Date/Time   NA 138 01/16/2019 0912   K 4.6 01/16/2019 0912   CL 104 01/14/2019 1000   CO2 23 01/14/2019 1000   GLUCOSE 92 01/14/2019 1000   BUN 14 01/14/2019 1000   CREATININE 1.21 01/14/2019 1000   CREATININE 1.01 08/03/2015 0730   CALCIUM 9.6 01/14/2019 1000   GFRNONAA >60 01/14/2019 1000   GFRAA >60 01/14/2019 1000    INR    Component Value Date/Time   INR 1.1 01/16/2019 1225     Intake/Output Summary (Last 24 hours) at 01/16/2019 1331 Last data filed at 01/16/2019 1210 Gross per 24 hour  Intake 3462 ml  Output 1840 ml  Net 1622 ml     Assessment/Plan:  68 y.o. male stable postop.  Stable for transfer to critical care unit     Rosetta Posner, MD [facility] [phone] 01/16/2019 1:31 PM

## 2019-01-17 ENCOUNTER — Inpatient Hospital Stay (HOSPITAL_COMMUNITY): Payer: Medicare HMO

## 2019-01-17 LAB — CBC
HCT: 35 % — ABNORMAL LOW (ref 39.0–52.0)
Hemoglobin: 11.4 g/dL — ABNORMAL LOW (ref 13.0–17.0)
MCH: 32.8 pg (ref 26.0–34.0)
MCHC: 32.6 g/dL (ref 30.0–36.0)
MCV: 100.6 fL — ABNORMAL HIGH (ref 80.0–100.0)
Platelets: 169 10*3/uL (ref 150–400)
RBC: 3.48 MIL/uL — ABNORMAL LOW (ref 4.22–5.81)
RDW: 14 % (ref 11.5–15.5)
WBC: 15.1 10*3/uL — ABNORMAL HIGH (ref 4.0–10.5)
nRBC: 0 % (ref 0.0–0.2)

## 2019-01-17 LAB — COMPREHENSIVE METABOLIC PANEL
ALT: 15 U/L (ref 0–44)
AST: 27 U/L (ref 15–41)
Albumin: 3.2 g/dL — ABNORMAL LOW (ref 3.5–5.0)
Alkaline Phosphatase: 27 U/L — ABNORMAL LOW (ref 38–126)
Anion gap: 5 (ref 5–15)
BUN: 15 mg/dL (ref 8–23)
CO2: 23 mmol/L (ref 22–32)
Calcium: 7.6 mg/dL — ABNORMAL LOW (ref 8.9–10.3)
Chloride: 111 mmol/L (ref 98–111)
Creatinine, Ser: 1.2 mg/dL (ref 0.61–1.24)
GFR calc Af Amer: >60 mL/min (ref >60)
GFR calc non Af Amer: >60 mL/min (ref >60)
Glucose, Bld: 114 mg/dL — ABNORMAL HIGH (ref 70–99)
Potassium: 4.4 mmol/L (ref 3.5–5.1)
Sodium: 139 mmol/L (ref 135–145)
Total Bilirubin: 0.5 mg/dL (ref 0.3–1.2)
Total Protein: 5 g/dL — ABNORMAL LOW (ref 6.5–8.1)

## 2019-01-17 LAB — MAGNESIUM: Magnesium: 2.1 mg/dL (ref 1.7–2.4)

## 2019-01-17 LAB — AMYLASE: Amylase: 66 U/L (ref 28–100)

## 2019-01-17 MED ORDER — OXYCODONE-ACETAMINOPHEN 5-325 MG PO TABS
1.0000 | ORAL_TABLET | ORAL | Status: DC | PRN
  Administered 2019-01-17: 21:00:00 2 via ORAL
  Administered 2019-01-17: 1 via ORAL
  Administered 2019-01-17 – 2019-01-21 (×11): 2 via ORAL
  Filled 2019-01-17 (×2): qty 2
  Filled 2019-01-17: qty 1
  Filled 2019-01-17 (×10): qty 2

## 2019-01-17 MED ORDER — FLUTICASONE FUROATE-VILANTEROL 200-25 MCG/INH IN AEPB
1.0000 | INHALATION_SPRAY | Freq: Every evening | RESPIRATORY_TRACT | Status: DC
  Administered 2019-01-17 – 2019-01-19 (×3): 1 via RESPIRATORY_TRACT
  Filled 2019-01-17: qty 28

## 2019-01-17 MED ORDER — DIPHENHYDRAMINE HCL 25 MG PO CAPS
50.0000 mg | ORAL_CAPSULE | Freq: Every evening | ORAL | Status: DC | PRN
  Administered 2019-01-18 – 2019-01-19 (×2): 50 mg via ORAL
  Filled 2019-01-17 (×2): qty 2

## 2019-01-17 MED ORDER — DIAZEPAM 5 MG PO TABS
5.0000 mg | ORAL_TABLET | Freq: Every evening | ORAL | Status: DC | PRN
  Administered 2019-01-18: 5 mg via ORAL
  Filled 2019-01-17: qty 1

## 2019-01-17 NOTE — Progress Notes (Signed)
Patient ID: Jeremy Mclean, male   DOB: [birth year], 68 y.o.   MRN: [id number]  Progress Note    01/17/2019 9:05 AM 1 Day Post-Op  Subjective: Expected incisional soreness.  Alert oriented and otherwise comfortable.  Did have some confusion early this morning and pulled his NG out.  Denies any nausea.  Actually states that he feels hungry.   Vitals:   01/17/19 0800 01/17/19 0848  BP: 126/67   Pulse: 77   Resp: 12   Temp:  98.7 F (37.1 C)  SpO2: 93%    Physical Exam: Abdominal incision healing without hematoma.  2+ posterior tibial pulses bilaterally  CBC    Component Value Date/Time   WBC 15.1 (H) 01/17/2019 0411   RBC 3.48 (L) 01/17/2019 0411   HGB 11.4 (L) 01/17/2019 0411   HCT 35.0 (L) 01/17/2019 0411   PLT 169 01/17/2019 0411   MCV 100.6 (H) 01/17/2019 0411   MCH 32.8 01/17/2019 0411   MCHC 32.6 01/17/2019 0411   RDW 14.0 01/17/2019 0411   LYMPHSABS 3.3 01/13/2017 1015   MONOABS 1.8 (H) 01/13/2017 1015   EOSABS 0.4 01/13/2017 1015   BASOSABS 0.0 01/13/2017 1015    BMET    Component Value Date/Time   NA 139 01/17/2019 0411   K 4.4 01/17/2019 0411   CL 111 01/17/2019 0411   CO2 23 01/17/2019 0411   GLUCOSE 114 (H) 01/17/2019 0411   BUN 15 01/17/2019 0411   CREATININE 1.20 01/17/2019 0411   CREATININE 1.01 08/03/2015 0730   CALCIUM 7.6 (L) 01/17/2019 0411   GFRNONAA >60 01/17/2019 0411   GFRAA >60 01/17/2019 0411    INR    Component Value Date/Time   INR 1.1 01/16/2019 1225     Intake/Output Summary (Last 24 hours) at 01/17/2019 0905 Last data filed at 01/17/2019 0800 Gross per 24 hour  Intake 5410.27 ml  Output 3035 ml  Net 2375.27 ml     Assessment/Plan:  68 y.o. male stable postop day 1.  Will leave NG out.  DC Foley and DC A-line.  Mobilize.  Transfer to 4 E.  Begin clear liquids     Rosetta Posner, MD FACS [facility] [phone] 01/17/2019 9:05 AM ko

## 2019-01-17 NOTE — Progress Notes (Signed)
PT Cancellation Note  Patient Details Name: Jeremy Mclean MRN: [id number] DOB: 14-Aug-1950   Cancelled Treatment:    Reason Eval/Treat Not Completed: Patient declined, no reason specified. PT attempted to see patient for evaluation however pt declining, citing fatigue from moving to chair once this morning and walking to the bathroom a few times this afternoon. PT attempts to educate pt on the importance of mobility and physical therapy to improve mobility and activity tolerance however pt continues to decline. PT will attempt to follow up as time allows.   Zenaida Niece 01/17/2019, 4:36 PM

## 2019-01-17 NOTE — Progress Notes (Addendum)
Patient found to have removed NG tube.  On call MD paged. Michela Pitcher it was ok leave out and not replace. Would inform Dr Donnetta Hutching.  No nausea noted will continue to monitor.  Kathleene Hazel RN

## 2019-01-18 ENCOUNTER — Encounter (HOSPITAL_COMMUNITY): Payer: Self-pay | Admitting: Vascular Surgery

## 2019-01-18 MED ORDER — BISACODYL 10 MG RE SUPP
10.0000 mg | Freq: Once | RECTAL | Status: DC
  Filled 2019-01-18: qty 1

## 2019-01-18 MED ORDER — HEPARIN SODIUM (PORCINE) 5000 UNIT/ML IJ SOLN
5000.0000 [IU] | Freq: Three times a day (TID) | INTRAMUSCULAR | Status: DC
  Administered 2019-01-18 – 2019-01-21 (×8): 5000 [IU] via SUBCUTANEOUS
  Filled 2019-01-18 (×8): qty 1

## 2019-01-18 MED FILL — Sodium Chloride Irrigation Soln 0.9%: Qty: 9000 | Status: AC

## 2019-01-18 MED FILL — Sodium Chloride Irrigation Soln 0.9%: Qty: 3000 | Status: AC

## 2019-01-18 MED FILL — Heparin Sodium (Porcine) Inj 1000 Unit/ML: INTRAMUSCULAR | Qty: 30 | Status: AC

## 2019-01-18 NOTE — Plan of Care (Signed)
Po progressing.

## 2019-01-18 NOTE — Progress Notes (Signed)
Right IJ removed, per order. Pressure applied and no bleeding noted. Vitals stable. Dressing placed and pt instructed to keep it on for 24 hrs. Pt in bed with call light within reach. Will continue to monitor.

## 2019-01-18 NOTE — Progress Notes (Addendum)
  Progress Note    01/18/2019 7:21 AM 2 Days Post-Op  Subjective:  Says he feels bad this morning overall.  Denies nausea   Tm 99.8 now 99.5 HR 70's-90's NSR [id number] systolic [id number] RA  Vitals:   01/18/19 0532 01/18/19 0700  BP: (!) 148/85   Pulse: 86 92  Resp: 20 (!) 24  Temp: 99.5 F (37.5 C)   SpO2: 94% 93%    Physical Exam: Cardiac:  regular Lungs:  Non labored Incisions:  Clean and dry Extremities:  Palpable right DP and left PT Abdomen:  Soft, NT/ND; so far, tolerating clear liquid without nausea.  CBC    Component Value Date/Time   WBC 15.1 (H) 01/17/2019 0411   RBC 3.48 (L) 01/17/2019 0411   HGB 11.4 (L) 01/17/2019 0411   HCT 35.0 (L) 01/17/2019 0411   PLT 169 01/17/2019 0411   MCV 100.6 (H) 01/17/2019 0411   MCH 32.8 01/17/2019 0411   MCHC 32.6 01/17/2019 0411   RDW 14.0 01/17/2019 0411   LYMPHSABS 3.3 01/13/2017 1015   MONOABS 1.8 (H) 01/13/2017 1015   EOSABS 0.4 01/13/2017 1015   BASOSABS 0.0 01/13/2017 1015    BMET    Component Value Date/Time   NA 139 01/17/2019 0411   K 4.4 01/17/2019 0411   CL 111 01/17/2019 0411   CO2 23 01/17/2019 0411   GLUCOSE 114 (H) 01/17/2019 0411   BUN 15 01/17/2019 0411   CREATININE 1.20 01/17/2019 0411   CREATININE 1.01 08/03/2015 0730   CALCIUM 7.6 (L) 01/17/2019 0411   GFRNONAA >60 01/17/2019 0411   GFRAA >60 01/17/2019 0411    INR    Component Value Date/Time   INR 1.1 01/16/2019 1225     Intake/Output Summary (Last 24 hours) at 01/18/2019 0721 Last data filed at 01/18/2019 0534 Gross per 24 hour  Intake 1604.74 ml  Output 1165 ml  Net 439.74 ml     Assessment:  68 y.o. male is s/p:  Aorto bi-iliac bypass 18 x 9 Hemashield.  Anastomosis to left common iliac and right external iliac artery  2 Days Post-Op  Plan: Vascular:  pt with palpable right DP and left PT pulses -Cardiac:  Hemodynamically stable in NSR -Pulmonary:  Oxygen sats at 92% on RA this am; encouraged IS every hour.  -Neuro:  In tact -Heme/ID:  Acute blood loss anemia-tolerating.  Low grade fever-most likely from atelectasis and improving leukocytosis that is most likely related to peri operative period.  IS every hour and ambulate.   -abdomen:  Soft; no nausea/vomiting at this point.  Has clear liquid diet and continue this for now.  Discussed with pt to take it slow and stop if he gets nauseated.   -DVT prophylaxis:  Will start heparin sq today -check labs in am   Leontine Locket, [place] [facility] [phone] 01/18/2019 7:21 AM  I have examined the patient, reviewed and agree with above.  Comfortable currently.  Abdomen soft and nontender.  2+ posterior tibial pulses.  Will give Dulcolax suppository.  Continue to mobilize.  Curt Jews, MD 01/18/2019 1:12 PM

## 2019-01-18 NOTE — Evaluation (Signed)
Physical Therapy Evaluation Patient Details Name: Jeremy Mclean MRN: [id number] DOB: 09/03/1950 Today's Date: 01/18/2019   History of Present Illness  Pt is a 68 y/o male s/p Aorto bi-iliac bypass 18 x 9 Hemashield, anastomosis to left common iliac and right external iliac artery. PMH including but not limited to AAA, DM and HTN.    Clinical Impression  Pt presented supine in bed with HOB elevated, awake and willing to participate in therapy session. Pt's spouse present throughout session as well. Prior to admission, pt was independent with all functional mobility and ADLs. Pt lives in a single level home with two small steps to enter. He will have 24/7 supervision/assistance upon d/c. At the time of evaluation, pt limited secondary to pain and weakness. He was able to perform bed mobility with supervision, transfers with supervision and ambulated in room with close min guard for safety. Pt with some SOB upon returning to bed after ambulation; however, HR and SPO2 stable. Pt would continue to benefit from skilled physical therapy services at this time while admitted and after d/c to address the below listed limitations in order to improve overall safety and independence with functional mobility.  All VSS throughout.    Follow Up Recommendations Home health PT;Supervision/Assistance - 24 hour    Equipment Recommendations  None recommended by PT    Recommendations for Other Services       Precautions / Restrictions Precautions Precautions: Fall Restrictions Weight Bearing Restrictions: No      Mobility  Bed Mobility Overal bed mobility: Needs Assistance Bed Mobility: Supine to Sit;Sit to Supine     Supine to sit: Supervision Sit to supine: Supervision   General bed mobility comments: supervision for safety  Transfers Overall transfer level: Needs assistance Equipment used: None Transfers: Sit to/from Stand Sit to Stand: Min guard;Supervision         General transfer  comment: min guard for safety with transitional movement into standing from EOB; supervision from [facility] over toilet  Ambulation/Gait Ambulation/Gait assistance: Min guard Gait Distance (Feet): 20 Feet Assistive device: None;IV Pole Gait Pattern/deviations: Step-through pattern;Decreased step length - right;Decreased step length - left;Decreased stride length Gait velocity: decreased   General Gait Details: pt with mild instability when ambulating in room but no overt LOB or need for physical assistance, min guard; despite pt not using an AD he frequently reached for support surfaces with either UE   Stairs            Wheelchair Mobility    Modified Rankin (Stroke Patients Only)       Balance Overall balance assessment: Needs assistance Sitting-balance support: Feet supported Sitting balance-Leahy Scale: Good     Standing balance support: During functional activity;No upper extremity supported;Single extremity supported Standing balance-Leahy Scale: Poor                               Pertinent Vitals/Pain Pain Assessment: Faces Faces Pain Scale: Hurts a little bit Pain Location: abdominal incision site Pain Descriptors / Indicators: Guarding;Sore Pain Intervention(s): Monitored during session;Repositioned    Home Living Family/patient expects to be discharged to:: Private residence Living Arrangements: Spouse/significant other Available Help at Discharge: Family;Available 24 hours/day Type of Home: House Home Access: Stairs to enter   [employer] of Steps: 2 Home Layout: One level Home Equipment: Walker - 2 wheels;Cane - single point;Crutches      Prior Function Level of Independence: Independent  Hand Dominance        Extremity/Trunk Assessment   Upper Extremity Assessment Upper Extremity Assessment: Overall WFL for tasks assessed    Lower Extremity Assessment Lower Extremity Assessment: Overall WFL for tasks  assessed       Communication   Communication: No difficulties  Cognition Arousal/Alertness: Awake/alert Behavior During Therapy: WFL for tasks assessed/performed Overall Cognitive Status: Impaired/Different from baseline Area of Impairment: Problem solving;Safety/judgement                         Safety/Judgement: Decreased awareness of safety;Decreased awareness of deficits   Problem Solving: Difficulty sequencing;Requires verbal cues        General Comments      Exercises     Assessment/Plan    PT Assessment Patient needs continued PT services  PT Problem List Decreased strength;Decreased activity tolerance;Decreased mobility;Decreased coordination;Decreased balance;Decreased knowledge of use of DME;Decreased safety awareness;Decreased knowledge of precautions       PT Treatment Interventions DME instruction;Gait training;Stair training;Therapeutic activities;Therapeutic exercise;Functional mobility training;Balance training;Neuromuscular re-education;Patient/family education    PT Goals (Current goals can be found in the Care Plan section)  Acute Rehab PT Goals Patient Stated Goal: decrease pain PT Goal Formulation: With patient/family Time For Goal Achievement: 02/01/19 Potential to Achieve Goals: Good    Frequency Min 3X/week   Barriers to discharge        Co-evaluation               AM-PAC PT "6 Clicks" Mobility  Outcome Measure Help needed turning from your back to your side while in a flat bed without using bedrails?: None Help needed moving from lying on your back to sitting on the side of a flat bed without using bedrails?: None Help needed moving to and from a bed to a chair (including a wheelchair)?: A Little Help needed standing up from a chair using your arms (e.g., wheelchair or bedside chair)?: None Help needed to walk in hospital room?: A Little Help needed climbing 3-5 steps with a railing? : A Lot 6 Click Score: 20    End  of Session Equipment Utilized During Treatment: Gait belt(donned high on chest to avoid incision site) Activity Tolerance: Patient limited by fatigue Patient left: in bed;with call bell/phone within reach;with family/visitor present Nurse Communication: Mobility status PT Visit Diagnosis: Other abnormalities of gait and mobility (R26.89);Pain Pain - part of body: (abdomen)    Time: [id number] PT Time Calculation (min) (ACUTE ONLY): 18 min   Charges:   PT Evaluation $PT Eval Moderate Complexity: 1 Mod          Eduard Clos, PT, DPT  Acute Rehabilitation Services Pager [phone] Office [place] 01/18/2019, 12:24 PM

## 2019-01-18 NOTE — Progress Notes (Signed)
[facility] contacted unit RN; unit RN confirmed she will take out central line as ordered.

## 2019-01-18 NOTE — Progress Notes (Signed)
Pt ambulated 200 ft with rolling walker. Pt tolerated well. Returned to bed with call light within reach. Will continue current plan of care.

## 2019-01-19 LAB — CBC
HCT: 32.7 % — ABNORMAL LOW (ref 39.0–52.0)
Hemoglobin: 11 g/dL — ABNORMAL LOW (ref 13.0–17.0)
MCH: 33.2 pg (ref 26.0–34.0)
MCHC: 33.6 g/dL (ref 30.0–36.0)
MCV: 98.8 fL (ref 80.0–100.0)
Platelets: 167 10*3/uL (ref 150–400)
RBC: 3.31 MIL/uL — ABNORMAL LOW (ref 4.22–5.81)
RDW: 13.5 % (ref 11.5–15.5)
WBC: 14.5 10*3/uL — ABNORMAL HIGH (ref 4.0–10.5)
nRBC: 0 % (ref 0.0–0.2)

## 2019-01-19 NOTE — Evaluation (Signed)
Occupational Therapy Evaluation Patient Details Name: Jeremy Mclean MRN: [id number] DOB: 1950-10-08 Today's Date: 01/19/2019    History of Present Illness Pt is a 68 y/o male s/p Aorto bi-iliac bypass 18 x 9 Hemashield, anastomosis to left common iliac and right external iliac artery. PMH including but not limited to AAA, DM and HTN.   Clinical Impression   Pt admitted for above and limited by problem list below, including abdominal incision pain, decreased activity tolerance and generalized weakness. PTA patient independent, currently requires min assist for LB ADLs, min guard for transfers and min guard to close supervision for in room mobility using RW (per pt preference). Patient has support of spouse at dc, as needed.  He will benefit from further OT services while admitted in order to optimize independence and safety with ADLs, mobility but anticipate no further needs after dc home.      Follow Up Recommendations  Supervision/Assistance - 24 hour;No OT follow up    Equipment Recommendations  None recommended by OT    Recommendations for Other Services       Precautions / Restrictions Precautions Precautions: Fall Restrictions Weight Bearing Restrictions: No      Mobility Bed Mobility Overal bed mobility: Needs Assistance Bed Mobility: Supine to Sit;Sit to Supine     Supine to sit: Supervision Sit to supine: Supervision   General bed mobility comments: cueing for technique for pain mgmt of abdominal incision  Transfers Overall transfer level: Needs assistance   Transfers: Sit to/from Stand Sit to Stand: Min guard;Supervision         General transfer comment: min guard to close supervision for safety and balance, cueing for hand placement (pt preference to use RW)    Balance Overall balance assessment: Needs assistance Sitting-balance support: Feet supported Sitting balance-Leahy Scale: Good     Standing balance support: Bilateral upper extremity  supported;During functional activity;No upper extremity supported Standing balance-Leahy Scale: Poor Standing balance comment: relaint on BUE support dynamically, static standing grooming without UE support                           ADL either performed or assessed with clinical judgement   ADL Overall ADL's : Needs assistance/impaired     Grooming: Wash/dry face;Set up;Standing   Upper Body Bathing: Set up;Sitting   Lower Body Bathing: Minimal assistance;Sit to/from stand Lower Body Bathing Details (indicate cue type and reason): decreased reach to BLEs, min guard sit to stand  Upper Body Dressing : Set up;Sitting   Lower Body Dressing: Minimal assistance;Sit to/from stand Lower Body Dressing Details (indicate cue type and reason): decreased reach to BLEs, min guard sit to stand  Toilet Transfer: Min guard;Ambulation Toilet Transfer Details (indicate cue type and reason): simulated in room         Functional mobility during ADLs: Min guard;Rolling walker General ADL Comments: pt preference to use RW, min guard to close supervision for safety; limited by pain, decreased activity tolerance     Vision   Vision Assessment?: No apparent visual deficits     Perception     Praxis      Pertinent Vitals/Pain Pain Assessment: 0-10 Pain Score: 5  Pain Location: abdominal incision site Pain Descriptors / Indicators: Guarding;Sore Pain Intervention(s): Monitored during session;Repositioned;Patient requesting pain meds-RN notified     Hand Dominance Right   Extremity/Trunk Assessment Upper Extremity Assessment Upper Extremity Assessment: Overall WFL for tasks assessed   Lower Extremity Assessment Lower Extremity  Assessment: Defer to PT evaluation       Communication Communication Communication: No difficulties   Cognition Arousal/Alertness: Awake/alert Behavior During Therapy: WFL for tasks assessed/performed Overall Cognitive Status: Impaired/Different  from baseline Area of Impairment: Problem solving;Safety/judgement                         Safety/Judgement: Decreased awareness of safety;Decreased awareness of deficits   Problem Solving: Difficulty sequencing;Requires verbal cues     General Comments  spouse present and supportive, VSS    Exercises     Shoulder Instructions      Home Living Family/patient expects to be discharged to:: Private residence Living Arrangements: Spouse/significant other Available Help at Discharge: Family;Available 24 hours/day Type of Home: House Home Access: Stairs to enter [employer] of Steps: 2   Home Layout: One level     Bathroom Shower/Tub: Teacher, early years/pre: Standard     Home Equipment: Environmental consultant - 2 wheels;Cane - single point;Crutches;Bedside commode          Prior Functioning/Environment Level of Independence: Independent                 OT Problem List: Decreased strength;Decreased activity tolerance;Impaired balance (sitting and/or standing);Decreased safety awareness;Decreased knowledge of use of DME or AE;Obesity;Pain;Cardiopulmonary status limiting activity      OT Treatment/Interventions: Self-care/ADL training;DME and/or AE instruction;Energy conservation;Therapeutic activities;Patient/family education;Balance training    OT Goals(Current goals can be found in the care plan section) Acute Rehab OT Goals Patient Stated Goal: decrease pain OT Goal Formulation: With patient Time For Goal Achievement: 02/02/19 Potential to Achieve Goals: Good  OT Frequency: Min 2X/week   Barriers to D/C:            Co-evaluation              AM-PAC OT "6 Clicks" Daily Activity     Outcome Measure Help from another person eating meals?: None Help from another person taking care of personal grooming?: A Little Help from another person toileting, which includes using toliet, bedpan, or urinal?: A Little Help from another person  bathing (including washing, rinsing, drying)?: A Little Help from another person to put on and taking off regular upper body clothing?: A Little Help from another person to put on and taking off regular lower body clothing?: A Little 6 Click Score: 19   End of Session Equipment Utilized During Treatment: Rolling walker Nurse Communication: Mobility status;Patient requests pain meds  Activity Tolerance: Patient tolerated treatment well Patient left: with call bell/phone within reach;in bed;with bed alarm set;with family/visitor present  OT Visit Diagnosis: Other abnormalities of gait and mobility (R26.89);Pain Pain - part of body: (abdominal incision)                Time: 1113-1130 OT Time Calculation (min): 17 min Charges:  OT General Charges $OT Visit: 1 Visit OT Evaluation $OT Eval Moderate Complexity: [place], OT Acute Rehabilitation Services Pager [phone] Office [phone]   Delight Stare 01/19/2019, 12:21 PM

## 2019-01-19 NOTE — Progress Notes (Addendum)
[facility] [place]  Subjective  - Some abdominal incision pain, pain medication helps.   Objective (!) 145/75 91 98.7 F (37.1 C) (Oral) 15 97%  Intake/Output Summary (Last 24 hours) at 01/19/2019 0732 Last data filed at 01/19/2019 0630 Gross per 24 hour  Intake -  Output 1900 ml  Net -1900 ml    Palpable pedal pulses Abdomin with + BS, incision healing well, No N/V/D Lungs non labored breathing Heart RRR   Assessment/Planning: POD # 3 Aorto bi-iliac bypass 18 x 9 Hemashield. Anastomosis to left common iliac and right external iliac artery   Abdominal Incision healing well with well perfused B LE Ambulating with rolling walker  about 50 feet with assistance yesterday Tolerating liquid diet without N/V + BS.  I will discuss advancing diet with Dr. Donnetta Hutching Cont. Mobilization as tolerates   Roxy Horseman 01/19/2019 7:32 AM --  Laboratory Lab Results: Recent Labs    01/17/19 0411 01/19/19 0302  WBC 15.1* 14.5*  HGB 11.4* 11.0*  HCT 35.0* 32.7*  PLT 169 167   BMET Recent Labs    01/16/19 1225 01/17/19 0411  NA 138 139  K 4.7 4.4  CL 108 111  CO2 23 23  GLUCOSE 142* 114*  BUN 11 15  CREATININE 1.15 1.20  CALCIUM 7.8* 7.6*    COAG Lab Results  Component Value Date   INR 1.1 01/16/2019   INR 0.9 01/14/2019   INR 1.09 01/14/2017   No results found for: PTT I have examined the patient, reviewed and agree with above.  Comfortable.  No nausea or vomiting.  Tolerating clear liquids.  Has had flatus but no bowel movement yet.  Will begin regular diet  Curt Jews, MD 01/19/2019 8:58 AM

## 2019-01-20 NOTE — Progress Notes (Addendum)
[facility] [place] N/V. Tolerating full diet.   Objective (!) 155/81 75 99.6 F (37.6 C) (Oral) 16 95%  Intake/Output Summary (Last 24 hours) at 01/20/2019 0720 Last data filed at 01/20/2019 0400 Gross per 24 hour  Intake 120 ml  Output 2055 ml  Net -1935 ml    Palpable pedal pulses B LE Abdomin soft, + BS, passed flatus.  Incision healing  well minimal ecchymosis Lungs non labored breathing Heart RRR GEN NAD  Assessment/Planning: POD # 4 Aorto bi-iliac bypass 18 x 9 Hemashield. Anastomosis to left common iliac and right external iliac artery  Abdominal incision healing well, tolerating PO's without N/V, passed flatus.  No BM will redoes Dulcolax today. Working with therapy Plan for discharge with [facility] PT Continue to mobilize  Roxy Horseman 01/20/2019 7:20 AM --  Laboratory Lab Results: Recent Labs    01/19/19 0302  WBC 14.5*  HGB 11.0*  HCT 32.7*  PLT 167   BMET No results for input(s): NA, K, CL, CO2, GLUCOSE, BUN, CREATININE, CALCIUM in the last 72 hours.  COAG Lab Results  Component Value Date   INR 1.1 01/16/2019   INR 0.9 01/14/2019   INR 1.09 01/14/2017   No results found for: PTT  I have examined the patient, reviewed and agree with above.  Probable discharge in a.m.  Has not had bowel movement  Curt Jews, MD 01/20/2019 9:00 AM

## 2019-01-21 MED ORDER — OXYCODONE-ACETAMINOPHEN 5-325 MG PO TABS: 1.0000 | ORAL_TABLET | ORAL | 0 refills | Status: DC | PRN

## 2019-01-21 MED ORDER — FLUTICASONE FUROATE-VILANTEROL 200-25 MCG/INH IN AEPB: 1.0000 | INHALATION_SPRAY | Freq: Every day | RESPIRATORY_TRACT | Status: DC

## 2019-01-21 NOTE — Discharge Summary (Signed)
AAA Discharge Summary    Jeremy Mclean December 15, 1950 68 y.o. male  [id number]  Admission Date: 01/16/2019  Discharge Date: 01/21/19  Physician: Rosetta Posner, MD  Admission Diagnosis: ABDOMINAL AORTIC ANEURYSM  Discharge Day services:    see progress note 01/21/19 Physical Exam: Vitals:   01/20/19 2313 01/21/19 0430  BP: 127/63 140/76  Pulse: 73 69  Resp: 17 17  Temp:  97.7 F (36.5 C)  SpO2: 96% 97%    Hospital Course:  The patient was admitted to the hospital and taken to the operating room on 01/16/2019 and underwent: Open repair of abdominal aortic aneurysm and bilateral common iliac artery aneurysms by Dr. Donnetta Hutching.  The pt tolerated the procedure well and was transported to the PACU in fair condition.  He was admitted to the ICU where he stayed for about 48 hours.  The remainder of the hospital course consisted of increasing mobilization and increasing intake of solids without difficulty.  Throughout his hospital stay he maintained palpable DP pulses.  He was evaluated by physical therapy and Occupational Therapy and recommendations were for home health physical therapy.  Case manager was consulted to arrange home health.  He will follow-up in office in about 2 to 3 weeks.  He will be prescribed 2 to 3 days of narcotic pain medication for continued postoperative pain control.  Discharge instructions were reviewed with the patient and he voiced his understanding.  He will be discharged this morning in stable condition.  CBC    Component Value Date/Time   WBC 14.5 (H) 01/19/2019 0302   RBC 3.31 (L) 01/19/2019 0302   HGB 11.0 (L) 01/19/2019 0302   HCT 32.7 (L) 01/19/2019 0302   PLT 167 01/19/2019 0302   MCV 98.8 01/19/2019 0302   MCH 33.2 01/19/2019 0302   MCHC 33.6 01/19/2019 0302   RDW 13.5 01/19/2019 0302   LYMPHSABS 3.3 01/13/2017 1015   MONOABS 1.8 (H) 01/13/2017 1015   EOSABS 0.4 01/13/2017 1015   BASOSABS 0.0 01/13/2017 1015    BMET    Component  Value Date/Time   NA 139 01/17/2019 0411   K 4.4 01/17/2019 0411   CL 111 01/17/2019 0411   CO2 23 01/17/2019 0411   GLUCOSE 114 (H) 01/17/2019 0411   BUN 15 01/17/2019 0411   CREATININE 1.20 01/17/2019 0411   CREATININE 1.01 08/03/2015 0730   CALCIUM 7.6 (L) 01/17/2019 0411   GFRNONAA >60 01/17/2019 0411   GFRAA >60 01/17/2019 0411       Discharge Diagnosis:  ABDOMINAL AORTIC ANEURYSM  Secondary Diagnosis: Patient Active Problem List   Diagnosis Date Noted  . AAA (abdominal aortic aneurysm) ([place]) 01/16/2019  . UGIB (upper gastrointestinal bleed)   . Acute blood loss anemia 01/13/2017  . Melena 01/13/2017  . Precordial chest pain 01/13/2017  . Iliac artery aneurysm ([place]) 07/11/2014  . Tobacco abuse 07/11/2014  . History of colonic polyps 03/18/2014  . Abdominal aortic aneurysm ([place]) 03/18/2014  . Essential hypertension 03/18/2014   Past Medical History:  Diagnosis Date  . AAA (abdominal aortic aneurysm) ([place])   . Anxiety   . Arthritis   . COPD (chronic obstructive pulmonary disease) ([place])   . Degenerative disorder of bone   . Depression   . Diverticulitis   . Dyspnea    due to COPD and when walking long distances per patient  . GERD (gastroesophageal reflux disease)   . History of kidney stones   . Hypertension   . Kidney stone  Allergies as of 01/21/2019      Reactions   Contrast Media [iodinated Diagnostic Agents] Other (See Comments)   Burning, needs pre meds   Iohexol Hives, Itching, Other (See Comments)   Burning. Needs pre-meds   Prednisone Other (See Comments)   Agitation and hyperactivity.  Per patient, he can take small doses for IV Dye Prep.      Medication List    TAKE these medications   albuterol 108 (90 Base) MCG/ACT inhaler Commonly known as: VENTOLIN HFA Inhale 1-2 puffs into the lungs every 6 (six) hours as needed for wheezing or shortness of breath.   amLODipine 10 MG tablet Commonly known as: NORVASC Take 10 mg by mouth  every evening.   atorvastatin 40 MG tablet Commonly known as: LIPITOR Take 1 tablet (40 mg total) by mouth daily.   Breo Ellipta 200-25 MCG/INH Aepb Generic drug: fluticasone furoate-vilanterol Inhale 1 puff into the lungs every evening.   diazepam 5 MG tablet Commonly known as: VALIUM Take 5 mg by mouth at bedtime.   diphenhydrAMINE 25 MG tablet Commonly known as: BENADRYL Take 25 mg by mouth at bedtime. With Diazepam   diphenhydrAMINE 50 MG capsule Commonly known as: BENADRYL Take 50 mg by mouth 1 hour prior to your procedure.   lisinopril 40 MG tablet Commonly known as: ZESTRIL Take 40 mg by mouth every evening.   oxyCODONE-acetaminophen 5-325 MG tablet Commonly known as: PERCOCET/ROXICET Take 1-2 tablets by mouth every 4 (four) hours as needed for moderate pain.   pantoprazole 40 MG tablet Commonly known as: PROTONIX Take 1 tablet (40 mg total) by mouth 2 (two) times daily before a meal. What changed:   when to take this  reasons to take this   predniSONE 50 MG tablet Commonly known as: DELTASONE One tablet (50mg ) 13 hours prior to procedure; one tablet (50mg ) 7 hours prior to procedure and then one tablet (50 mg) one hour prior to procedure.   traMADol 50 MG tablet Commonly known as: ULTRAM Take 50 mg by mouth 3 (three) times daily.       Instructions:  [facility] Discharge Instructions Open Aortic Surgery  Please refer to the following instructions for your post-procedure care. Your surgeon or Physician Assistant will discuss any changes with you.  Activity  Avoid lifting more than eight pounds (a gallon of milk) until after your first post-operative visit. You are encouraged to walk as much as you can. You can slowly return to normal activities but must avoid strenuous activity and heavy lifting until your doctor tells you it's OK. Heavy lifting can hurt the incision and cause a hernia. Avoid activities such as  vacuuming or swinging a golf club. It is normal to feel tired for several weeks after your surgery. Do not drive until your doctor gives the OK and you are no longer taking prescription pain medications. It is also normal to have difficulty with sleep habits, eating and bowl movements after surgery. These will go away with time.  Bathing/Showering  You may shower after you go home. Do not soak in a bathtub, hot tub, or swim until the incision heals.  Incision Care  Shower every day. Clean your incision with mild soap and water. Pat the area dry with a clean towel. You do not need a bandage unless otherwise instructed. Do not apply any ointments or creams to your incision. You may have skin glue on your incision. Do not peel it off. It will  come off on its own in about one week. If you have staples or sutures along your incision, they will be removed at your post op appointment.  If you have groin incisions, wash the groin wounds with soap and water daily and pat dry. (No tub bath-only shower)  Then put a dry gauze or washcloth in the groin to keep this area dry to help prevent wound infection.  Do this daily and as needed.  Do not use Vaseline or neosporin on your incisions.  Only use soap and water on your incisions and then protect and keep dry.  Diet  Resume your normal diet. There are no special food restriction following this procedure. A low fat/low cholesterol diet is recommended for all patients with vascular disease. After your aortic surgery, it's normal to feel full faster than usual and to not feel as hungry as you normally would. You will probably lose weight initially following your surgery. It's best to eat small, frequent meals over the course of the day. Call the office if you find that you are unable to eat even small meals. In order to heal from your surgery, it is CRITICAL to get adequate nutrition. Your body requires vitamins, minerals, and protein. Vegetables are the best source  of vitamins and minerals. causing pain, you may take over-the-counter pain reliever such as acetaminophen (Tylenol). If you were prescribed a stronger pain medication, please be aware these medication can cause nausea and constipation. Prevent nausea by taking the medication with a snack or meal. Avoid constipation by drinking plenty of fluids and eating foods with a high amount of fiber, such as fruits, vegetables and grains. Take 100mg  of the over-the-counter stool softener Colace twice a day as needed to help with constipation. A laxative, such as Milk of Magnesia, may be recommended for you at this time. Do not take a laxative unless your surgeon or Physician Assistant. tells you it's OK. Do not take Tylenol if you are taking stronger pain medications (such as Percocet).  Follow Up  Our office will schedule a follow up appointment 2-3 weeks after discharge.  Please call us immediately for any of the following conditions    .     Severe or worsening pain in your legs or feet or in your abdomen back or chest. Increased pain, redness drainage (pus) from your incision site. Increased abdominal pain, bloating, nausea, vomiting, or persistent diarrhea. Fever of 101 degrees or higher. Swelling in your leg (s).  Reduce your risk of vascular disease  Stop smoking. If you would like help, call [facility] at 1-800-QUIT-NOW (615)579-5004) or [place] at (726)078-2963. Manage your cholesterol Maintain a desired weight Control your diabetes Keep your blood pressure down  If you have any questions please call the office at [phone].  Disposition: home with [facility] PT  Patient's condition: is Good  Follow up: 1. Dr. Donnetta Hutching in 2-3 weeks   Dagoberto Ligas, PA-C [facility] [phone] 01/21/2019  7:44 AM   - For VQI Registry use -  Post-op:  Time to Extubation: [x]  In OR, [ ]  < 12 hrs, [ ]  12-24 hrs, [ ]  >=24 hrs Vasopressors Req. Post-op: No ICU Stay: 2 days  Transfusion: No  MI: No, [ ]  Troponin only, [ ]  EKG or Clinical New Arrhythmia: No  Complications: CHF: No Resp failure: No, [ ]  Pneumonia, [ ]  Ventilator Chg in renal function: No, [ ]  Inc. Cr > 0.5, [ ]  Temp. Dialysis, [ ]  Permanent dialysis Leg  ischemia: No, no Surgery needed, [ ]  Yes, Surgery needed, [ ]  Amputation Bowel ischemia: No, [ ]  Medical Rx, [ ]  Surgical Rx Wound complication: No, [ ]  Superficial separation/infection, [ ]  Return to OR Return to OR: No  Return to OR for bleeding: No Stroke: No, [ ]  Minor, [ ]  Major  Discharge medications: Statin use:  Yes  ASA use:  Yes  Plavix use:  No  Beta blocker use:  No  ACEI use:  Yes ARB use:  No CCB use:  Yes Coumadin use:  No

## 2019-01-21 NOTE — Progress Notes (Signed)
Physical Therapy Treatment Patient Details Name: Jeremy Mclean MRN: [id number] DOB: May 04, 1950 Today's Date: 01/21/2019    History of Present Illness Pt is a 68 y/o male s/p Aorto bi-iliac bypass 18 x 9 Hemashield, anastomosis to left common iliac and right external iliac artery. PMH including but not limited to AAA, DM and HTN.    PT Comments    Patient progressing well towards PT goals. Improved ambulation distance with min guard assist for balance/safety. Does better with use of RW to improve distance and endurance. Encouraged using RW at home until strength/mobility improve to help reduce fall risk. Pt agreeable. Noted to have 2/4 DOE but VSS throughout. Eager to return home. Recommend walking with nursing two more times today. Will follow.    Follow Up Recommendations  Home health PT;Supervision/Assistance - 24 hour     Equipment Recommendations  None recommended by PT    Recommendations for Other Services       Precautions / Restrictions Precautions Precautions: Fall Restrictions Weight Bearing Restrictions: No    Mobility  Bed Mobility               General bed mobility comments: Up in chair upon PT arrival.  Transfers Overall transfer level: Needs assistance Equipment used: Rolling walker (2 wheeled) Transfers: Sit to/from Stand Sit to Stand: Min guard         General transfer comment: Min guard for safety. Pt pulling up on RW despite cues for hand placement.  Ambulation/Gait Ambulation/Gait assistance: Min guard Gait Distance (Feet): 120 Feet Assistive device: Rolling walker (2 wheeled) Gait Pattern/deviations: Step-through pattern;Decreased step length - right;Decreased step length - left;Decreased stride length;Wide base of support;Trunk flexed Gait velocity: decreased   General Gait Details: Slow, mildly unsteady gait with use of RW; 2/4 DOE. Cues for RW proximity as RW too far anterior.   Stairs             Wheelchair Mobility     Modified Rankin (Stroke Patients Only)       Balance Overall balance assessment: Needs assistance Sitting-balance support: Feet supported;No upper extremity supported Sitting balance-Leahy Scale: Good     Standing balance support: During functional activity Standing balance-Leahy Scale: Poor Standing balance comment: Reliant on BUE support.                            Cognition Arousal/Alertness: Awake/alert Behavior During Therapy: WFL for tasks assessed/performed Overall Cognitive Status: No family/caregiver present to determine baseline cognitive functioning Area of Impairment: Problem solving                         Safety/Judgement: Decreased awareness of safety   Problem Solving: Requires verbal cues;Difficulty sequencing General Comments: Pt understands he needs to use RW today and that he should not be getting up on his own. Needs max cues for technique.      Exercises      General Comments General comments (skin integrity, edema, etc.): VSS.      Pertinent Vitals/Pain Pain Assessment: Faces Faces Pain Scale: Hurts little more Pain Location: abdomen and back Pain Descriptors / Indicators: Sore Pain Intervention(s): Monitored during session;Repositioned;Patient requesting pain meds-RN notified    Home Living                      Prior Function            PT Goals (current goals  can now be found in the care plan section) Progress towards PT goals: Progressing toward goals    Frequency    Min 3X/week      PT Plan Current plan remains appropriate    Co-evaluation              AM-PAC PT "6 Clicks" Mobility   Outcome Measure  Help needed turning from your back to your side while in a flat bed without using bedrails?: None Help needed moving from lying on your back to sitting on the side of a flat bed without using bedrails?: None Help needed moving to and from a bed to a chair (including a wheelchair)?: A  Little Help needed standing up from a chair using your arms (e.g., wheelchair or bedside chair)?: A Little Help needed to walk in hospital room?: A Little Help needed climbing 3-5 steps with a railing? : A Lot 6 Click Score: 19    End of Session Equipment Utilized During Treatment: Gait belt Activity Tolerance: Patient tolerated treatment well Patient left: in chair;with call bell/phone within reach Nurse Communication: Mobility status PT Visit Diagnosis: Other abnormalities of gait and mobility (R26.89);Pain Pain - part of body: (abdomen)     Time: [id number] PT Time Calculation (min) (ACUTE ONLY): 18 min  Charges:  $Gait Training: 8-22 mins                     Marisa Severin, PT, DPT Acute Rehabilitation Services Pager [phone] Office [place] 01/21/2019, 10:25 AM

## 2019-01-21 NOTE — Progress Notes (Signed)
Occupational Therapy Treatment Patient Details Name: Jeremy Mclean MRN: [id number] DOB: [birth year] Today's Date: 01/21/2019    History of present illness Pt is a 68 y/o male s/p Aorto bi-iliac bypass 18 x 9 Hemashield, anastomosis to left common iliac and right external iliac artery. PMH including but not limited to AAA, DM and HTN.   OT comments  Patient semi-supine upon arrival, agreeable to therapy. Verbal cues provided to sequence bed mobility to minimize pain in abdomen. Education use of figure 4 technique for lower body dressing, pt demo seated at edge of bed to initiate doff/don of socks. Pt requesting to use bathroom, and that he didn't have to use walker earlier this AM. Upon sit to stand patient have mild loss of balance having to sit quickly onto edge of bed. OT set up rolling walker and provide verbal cues for body mechanics with second sit to stand. Patient min guard for toilet transfer with minor loss of balance when sitting onto toilet, verbal cues for safety + utilize grab bar. Patient transfer to bedside chair at end of session. Patient would continue to benefit from acute OT services for safety during functional transfers + ADLs, balance training, activity tolerance.    Follow Up Recommendations  Supervision/Assistance - 24 hour;No OT follow up    Equipment Recommendations  None recommended by OT       Precautions / Restrictions Precautions Precautions: Fall Restrictions Weight Bearing Restrictions: No       Mobility Bed Mobility Overal bed mobility: Needs Assistance Bed Mobility: Supine to Sit;Rolling Rolling: Supervision   Supine to sit: Min guard;HOB elevated     General bed mobility comments: verbal cues to sequence log rolling to minimize stomach pain  Transfers Overall transfer level: Needs assistance Equipment used: Rolling walker (2 wheeled) Transfers: Sit to/from Stand Sit to Stand: Min guard         General transfer comment: min guard for  safety due to mild loss of balance during functional task    Balance Overall balance assessment: Needs assistance Sitting-balance support: Feet supported;No upper extremity supported Sitting balance-Leahy Scale: Good     Standing balance support: Bilateral upper extremity supported;During functional activity Standing balance-Leahy Scale: Poor Standing balance comment: Reliant on BUE support.                           ADL either performed or assessed with clinical judgement   ADL Overall ADL's : Needs assistance/impaired                     Lower Body Dressing: Supervision/safety;Sitting/lateral leans Lower Body Dressing Details (indicate cue type and reason): patient utilize figure 4 position to reach B feet/initiate doff and donning socks  Toilet Transfer: Min guard;Ambulation;Cueing for safety;Regular Toilet;RW Toilet Transfer Details (indicate cue type and reason): patient have mild loss of balance when sitting onto toilet, require verbal cues for safety and to utilize grab bar when sitting Toileting- Clothing Manipulation and Hygiene: Independent;Sitting/lateral lean       Functional mobility during ADLs: Min guard;Rolling walker General ADL Comments: pt reports not needing walker however with first sit to stand patient has to sit quickly. mod verbal cues for sequencing      Vision       Perception     Praxis      Cognition Arousal/Alertness: Awake/alert Behavior During Therapy: WFL for tasks assessed/performed Overall Cognitive Status: No family/caregiver present to determine baseline cognitive functioning  Area of Impairment: Problem solving, Safety/judgement                         Safety/Judgement: Decreased awareness of safety   Problem Solving: Requires verbal cues                    Pertinent Vitals/ Pain       Pain Assessment: 0-10 Pain Score: 6  Faces Pain Scale: Hurts little more Pain Location: abdomen Pain  Descriptors / Indicators: Sore Pain Intervention(s): Monitored during session;Limited activity within patient's tolerance;Patient requesting pain meds-RN notified         Frequency  Min 2X/week        Progress Toward Goals  OT Goals(current goals can now be found in the care plan section)  Progress towards OT goals: Progressing toward goals  Acute Rehab OT Goals Patient Stated Goal: decrease pain OT Goal Formulation: With patient Time For Goal Achievement: 02/02/19 Potential to Achieve Goals: Good ADL Goals Pt Will Perform Grooming: with modified independence;standing Pt Will Perform Lower Body Dressing: with modified independence;with adaptive equipment;with caregiver independent in assisting;sit to/from stand Pt Will Transfer to Toilet: with modified independence;ambulating;bedside commode Pt Will Perform Toileting - Clothing Manipulation and hygiene: with modified independence;sit to/from stand Pt Will Perform Tub/Shower Transfer: Tub transfer;shower seat;ambulating;rolling walker;with supervision  Plan Discharge plan remains appropriate       AM-PAC OT "6 Clicks" Daily Activity     Outcome Measure   Help from another person eating meals?: None Help from another person taking care of personal grooming?: A Little Help from another person toileting, which includes using toliet, bedpan, or urinal?: A Little Help from another person bathing (including washing, rinsing, drying)?: A Little Help from another person to put on and taking off regular upper body clothing?: A Little Help from another person to put on and taking off regular lower body clothing?: A Little 6 Click Score: 19    End of Session Equipment Utilized During Treatment: Rolling walker  OT Visit Diagnosis: Other abnormalities of gait and mobility (R26.89);Pain Pain - part of body: (abdominal incision)   Activity Tolerance Patient tolerated treatment well   Patient Left in chair;with call bell/phone  within reach   Nurse Communication Mobility status;Patient requests pain meds        Time: [id number] OT Time Calculation (min): 24 min  Charges: OT General Charges $OT Visit: 1 Visit OT Treatments $Self Care/Home Management : 23-37 mins  Shon Millet OT OT office: [place] 01/21/2019, 11:32 AM

## 2019-01-21 NOTE — Progress Notes (Addendum)
  Progress Note    01/21/2019 7:23 AM 5 Days Post-Op  Subjective:  2 bowel movements yesterday   Vitals:   01/20/19 2313 01/21/19 0430  BP: 127/63 140/76  Pulse: 73 69  Resp: 17 17  Temp:  97.7 F (36.5 C)  SpO2: 96% 97%   Physical Exam: Lungs:  Non labored Incisions:  abd incision c/d/i Extremities:  Palpable DP pulses bilaterally Abdomen:  Soft, NT, ND Neurologic: A&O  CBC    Component Value Date/Time   WBC 14.5 (H) 01/19/2019 0302   RBC 3.31 (L) 01/19/2019 0302   HGB 11.0 (L) 01/19/2019 0302   HCT 32.7 (L) 01/19/2019 0302   PLT 167 01/19/2019 0302   MCV 98.8 01/19/2019 0302   MCH 33.2 01/19/2019 0302   MCHC 33.6 01/19/2019 0302   RDW 13.5 01/19/2019 0302   LYMPHSABS 3.3 01/13/2017 1015   MONOABS 1.8 (H) 01/13/2017 1015   EOSABS 0.4 01/13/2017 1015   BASOSABS 0.0 01/13/2017 1015    BMET    Component Value Date/Time   NA 139 01/17/2019 0411   K 4.4 01/17/2019 0411   CL 111 01/17/2019 0411   CO2 23 01/17/2019 0411   GLUCOSE 114 (H) 01/17/2019 0411   BUN 15 01/17/2019 0411   CREATININE 1.20 01/17/2019 0411   CREATININE 1.01 08/03/2015 0730   CALCIUM 7.6 (L) 01/17/2019 0411   GFRNONAA >60 01/17/2019 0411   GFRAA >60 01/17/2019 0411    INR    Component Value Date/Time   INR 1.1 01/16/2019 1225     Intake/Output Summary (Last 24 hours) at 01/21/2019 0723 Last data filed at 01/21/2019 0416 Gross per 24 hour  Intake 120 ml  Output 2000 ml  Net -1880 ml     Assessment/Plan:  68 y.o. male is s/p open repair of AAA and bilateral CIA aneurysms 5 Days Post-Op   Tolerating diet; functioning bowels BLE well perfused with palpable DP pulses Case manager consulted to arrange [facility] PT D/c home this morning; follow up in 2-3 weeks   Dagoberto Ligas, PA-C [facility] [phone] 01/21/2019 7:23 AM

## 2019-01-21 NOTE — TOC Transition Note (Signed)
Transition of Care [facility]) - CM/SW Discharge Note Jeremy Gibbons RN, BSN Transitions of Care Unit 4E- RN Case Manager [phone]   Patient Details  Name: Jeremy Mclean MRN: [id number] Date of Birth: 1950-03-25  Transition of Care [facility]) CM/SW Contact:  Jeremy Patricia, RN Phone Number: 01/21/2019, 10:29 AM   Clinical Narrative:    Pt stable for transition home s/p AAA repair- orders placed for HHPT. CM spoke with pt and wife at bedside- list provided for [facility] choice Per CMS guidelines from medicare.gov website with star ratings (copy placed in shadow chart)- pt would like to try [facility] as first choice with [facility] as second choice- followed after that with no preference if neither one of them can accept referral. Pt reports he has RW at home no other DME needs noted. Wife to transport home, no difficulty getting meds. Address, phone # and PCP all confirmed with pt in epic. Call made to [facility] with [facility] for [facility] referral- referral has been accepted for HHPT.    Final next level of care: [place] Barriers to Discharge: No Barriers Identified   Patient Goals and CMS Choice Patient states their goals for this hospitalization and ongoing recovery are:: To get better and be able to get back on my tractors CMS Medicare.gov Compare Post Acute Care list provided to:: Patient Choice offered to / list presented to : Patient, Spouse  Discharge Placement               Home with [facility]        Discharge Plan and Services   Discharge Planning Services: CM Consult Post Acute Care Choice: Home Health          DME Arranged: N/A DME Agency: NA       HH Arranged: PT HH Agency: [place] Date [place]: 01/21/19 Time [place]: [id number] Representative spoke with at [place]: Tommi Rumps  Social Determinants of Health ([place]) Interventions     Readmission Risk Interventions Readmission Risk Prevention Plan 01/21/2019  Post Dischage Appt  Complete  Medication Screening Complete  Transportation Screening Complete  Some recent data might be hidden

## 2019-01-21 NOTE — Progress Notes (Signed)
Pt provided discharge instructions and education. IV removed and intact. Telebox removed/ccmd notified. Pt denies complaints. Vitals stable. Pt tx via volunteers by wheelchair to valet to meet ride. Jerald Kief, RN

## 2019-01-22 LAB — TYPE AND SCREEN
ABO/RH(D): O NEG
Antibody Screen: NEGATIVE
Unit division: 0
Unit division: 0

## 2019-01-22 LAB — BPAM RBC
Blood Product Expiration Date: 202012092359
Blood Product Expiration Date: 202012092359
ISSUE DATE / TIME: 202011250722
ISSUE DATE / TIME: 202011250722
Unit Type and Rh: 9500
Unit Type and Rh: 9500

## 2019-01-28 DIAGNOSIS — F419 Anxiety disorder, unspecified: Secondary | ICD-10-CM | POA: Diagnosis not present

## 2019-01-28 DIAGNOSIS — Z48812 Encounter for surgical aftercare following surgery on the circulatory system: Secondary | ICD-10-CM | POA: Diagnosis not present

## 2019-01-28 DIAGNOSIS — J449 Chronic obstructive pulmonary disease, unspecified: Secondary | ICD-10-CM | POA: Diagnosis not present

## 2019-01-28 DIAGNOSIS — F329 Major depressive disorder, single episode, unspecified: Secondary | ICD-10-CM | POA: Diagnosis not present

## 2019-01-28 DIAGNOSIS — D62 Acute posthemorrhagic anemia: Secondary | ICD-10-CM | POA: Diagnosis not present

## 2019-01-28 DIAGNOSIS — Z720 Tobacco use: Secondary | ICD-10-CM | POA: Diagnosis not present

## 2019-01-28 DIAGNOSIS — I10 Essential (primary) hypertension: Secondary | ICD-10-CM | POA: Diagnosis not present

## 2019-01-28 DIAGNOSIS — M160 Bilateral primary osteoarthritis of hip: Secondary | ICD-10-CM | POA: Diagnosis not present

## 2019-01-28 DIAGNOSIS — K219 Gastro-esophageal reflux disease without esophagitis: Secondary | ICD-10-CM | POA: Diagnosis not present

## 2019-01-31 DIAGNOSIS — M160 Bilateral primary osteoarthritis of hip: Secondary | ICD-10-CM | POA: Diagnosis not present

## 2019-01-31 DIAGNOSIS — D62 Acute posthemorrhagic anemia: Secondary | ICD-10-CM | POA: Diagnosis not present

## 2019-01-31 DIAGNOSIS — F419 Anxiety disorder, unspecified: Secondary | ICD-10-CM | POA: Diagnosis not present

## 2019-01-31 DIAGNOSIS — J449 Chronic obstructive pulmonary disease, unspecified: Secondary | ICD-10-CM | POA: Diagnosis not present

## 2019-01-31 DIAGNOSIS — Z48812 Encounter for surgical aftercare following surgery on the circulatory system: Secondary | ICD-10-CM | POA: Diagnosis not present

## 2019-01-31 DIAGNOSIS — Z720 Tobacco use: Secondary | ICD-10-CM | POA: Diagnosis not present

## 2019-01-31 DIAGNOSIS — I10 Essential (primary) hypertension: Secondary | ICD-10-CM | POA: Diagnosis not present

## 2019-01-31 DIAGNOSIS — F329 Major depressive disorder, single episode, unspecified: Secondary | ICD-10-CM | POA: Diagnosis not present

## 2019-01-31 DIAGNOSIS — K219 Gastro-esophageal reflux disease without esophagitis: Secondary | ICD-10-CM | POA: Diagnosis not present

## 2019-02-05 DIAGNOSIS — F329 Major depressive disorder, single episode, unspecified: Secondary | ICD-10-CM | POA: Diagnosis not present

## 2019-02-05 DIAGNOSIS — D62 Acute posthemorrhagic anemia: Secondary | ICD-10-CM | POA: Diagnosis not present

## 2019-02-05 DIAGNOSIS — K219 Gastro-esophageal reflux disease without esophagitis: Secondary | ICD-10-CM | POA: Diagnosis not present

## 2019-02-05 DIAGNOSIS — Z48812 Encounter for surgical aftercare following surgery on the circulatory system: Secondary | ICD-10-CM | POA: Diagnosis not present

## 2019-02-05 DIAGNOSIS — I10 Essential (primary) hypertension: Secondary | ICD-10-CM | POA: Diagnosis not present

## 2019-02-05 DIAGNOSIS — J449 Chronic obstructive pulmonary disease, unspecified: Secondary | ICD-10-CM | POA: Diagnosis not present

## 2019-02-05 DIAGNOSIS — Z720 Tobacco use: Secondary | ICD-10-CM | POA: Diagnosis not present

## 2019-02-05 DIAGNOSIS — F419 Anxiety disorder, unspecified: Secondary | ICD-10-CM | POA: Diagnosis not present

## 2019-02-05 DIAGNOSIS — M160 Bilateral primary osteoarthritis of hip: Secondary | ICD-10-CM | POA: Diagnosis not present

## 2019-02-12 ENCOUNTER — Encounter: Payer: Self-pay | Admitting: Vascular Surgery

## 2019-02-12 ENCOUNTER — Other Ambulatory Visit: Payer: Self-pay

## 2019-02-12 ENCOUNTER — Ambulatory Visit (INDEPENDENT_AMBULATORY_CARE_PROVIDER_SITE_OTHER): Payer: Self-pay | Admitting: Vascular Surgery

## 2019-02-12 VITALS — BP 118/81 | HR 80 | Temp 98.2°F | Resp 20 | Ht 70.0 in | Wt 205.9 lb

## 2019-02-12 DIAGNOSIS — I713 Abdominal aortic aneurysm, ruptured, unspecified: Secondary | ICD-10-CM

## 2019-02-12 DIAGNOSIS — I723 Aneurysm of iliac artery: Secondary | ICD-10-CM

## 2019-02-12 NOTE — Progress Notes (Signed)
Patient name: Jeremy Mclean MRN: [id number] DOB: 16-Aug-1950 Sex: male  REASON FOR VISIT: Follow-up open aortic and iliac artery aneurysm repair  HPI: Jeremy Mclean is a 68 y.o. male here today for follow-up.  Had moderate size aortic and large bilateral common iliac artery aneurysms.  Underwent aorto to left common and right external iliac artery bypass on 01/16/2019.  He is here today for follow-up.  He has had no postoperative complications.  He reports that he did have some gas bloating prior to surgery and this has persisted.  His bowels are working but he is requiring occasional laxative for this.  He reports that his energy level is returned to approximately 50%  Current Outpatient Medications  Medication Sig Dispense Refill  . albuterol (PROVENTIL HFA;VENTOLIN HFA) 108 (90 Base) MCG/ACT inhaler Inhale 1-2 puffs into the lungs every 6 (six) hours as needed for wheezing or shortness of breath.    Marland Kitchen amLODipine (NORVASC) 10 MG tablet Take 10 mg by mouth every evening.   4  . atorvastatin (LIPITOR) 40 MG tablet Take 1 tablet (40 mg total) by mouth daily. 90 tablet 3  . BREO ELLIPTA 200-25 MCG/INH AEPB Inhale 1 puff into the lungs every evening.   12  . diazepam (VALIUM) 5 MG tablet Take 5 mg by mouth at bedtime.     . diphenhydrAMINE (BENADRYL) 25 MG tablet Take 25 mg by mouth at bedtime. With Diazepam    . diphenhydrAMINE (BENADRYL) 50 MG capsule Take 50 mg by mouth 1 hour prior to your procedure. 1 capsule 0  . lisinopril (PRINIVIL,ZESTRIL) 40 MG tablet Take 40 mg by mouth every evening.   4  . oxyCODONE-acetaminophen (PERCOCET/ROXICET) 5-325 MG tablet Take 1-2 tablets by mouth every 4 (four) hours as needed for moderate pain. 25 tablet 0  . pantoprazole (PROTONIX) 40 MG tablet Take 1 tablet (40 mg total) by mouth 2 (two) times daily before a meal. (Patient taking differently: Take 40 mg by mouth 2 (two) times daily as needed (acid reflux/indigestion.). )  60 tablet 2  . predniSONE (DELTASONE) 50 MG tablet One tablet (50mg ) 13 hours prior to procedure; one tablet (50mg ) 7 hours prior to procedure and then one tablet (50 mg) one hour prior to procedure. 3 tablet 0  . traMADol (ULTRAM) 50 MG tablet Take 50 mg by mouth 3 (three) times daily.      No current facility-administered medications for this visit.   Facility-Administered Medications Ordered in Other Visits  Medication Dose Route Frequency Provider Last Rate Last Admin  . rabies immune globulin (HYPERAB) injection 1,720 Units  1,720 Units Intramuscular Once Jennye Boroughs M, PA-C      . rabies vaccine (RABAVERT/IMOVAX) injection 1 mL  1 mL Intramuscular Once Duaine Dredge, PA-C         PHYSICAL EXAM: Vitals:   02/12/19 1514  BP: 118/81  Pulse: 80  Resp: 20  Temp: 98.2 F (36.8 C)  SpO2: 95%  Weight: 205 lb 14.4 oz (93.4 kg)  Height: 5\' 10"  (1.778 m)    GENERAL: The patient is a well-nourished male, in no acute distress. The vital signs are documented above. Abdomen soft and nontender.  Well-healed midline incision.  2+ popliteal pulses bilaterally  MEDICAL ISSUES: Stable 3 weeks status post open aneurysm repair.  He will resume full activities aside from heavy lifting.  We will see him again in 3 months for continued follow-up   Rosetta Posner, MD [facility]  [facility] Tel [phone] Pager [phone]

## 2019-02-13 DIAGNOSIS — K219 Gastro-esophageal reflux disease without esophagitis: Secondary | ICD-10-CM | POA: Diagnosis not present

## 2019-02-13 DIAGNOSIS — F329 Major depressive disorder, single episode, unspecified: Secondary | ICD-10-CM | POA: Diagnosis not present

## 2019-02-13 DIAGNOSIS — M160 Bilateral primary osteoarthritis of hip: Secondary | ICD-10-CM | POA: Diagnosis not present

## 2019-02-13 DIAGNOSIS — F419 Anxiety disorder, unspecified: Secondary | ICD-10-CM | POA: Diagnosis not present

## 2019-02-13 DIAGNOSIS — Z720 Tobacco use: Secondary | ICD-10-CM | POA: Diagnosis not present

## 2019-02-13 DIAGNOSIS — I10 Essential (primary) hypertension: Secondary | ICD-10-CM | POA: Diagnosis not present

## 2019-02-13 DIAGNOSIS — J449 Chronic obstructive pulmonary disease, unspecified: Secondary | ICD-10-CM | POA: Diagnosis not present

## 2019-02-13 DIAGNOSIS — D62 Acute posthemorrhagic anemia: Secondary | ICD-10-CM | POA: Diagnosis not present

## 2019-02-13 DIAGNOSIS — Z48812 Encounter for surgical aftercare following surgery on the circulatory system: Secondary | ICD-10-CM | POA: Diagnosis not present

## 2019-02-19 DIAGNOSIS — F329 Major depressive disorder, single episode, unspecified: Secondary | ICD-10-CM | POA: Diagnosis not present

## 2019-02-19 DIAGNOSIS — Z48812 Encounter for surgical aftercare following surgery on the circulatory system: Secondary | ICD-10-CM | POA: Diagnosis not present

## 2019-02-19 DIAGNOSIS — J449 Chronic obstructive pulmonary disease, unspecified: Secondary | ICD-10-CM | POA: Diagnosis not present

## 2019-02-19 DIAGNOSIS — D62 Acute posthemorrhagic anemia: Secondary | ICD-10-CM | POA: Diagnosis not present

## 2019-02-19 DIAGNOSIS — I10 Essential (primary) hypertension: Secondary | ICD-10-CM | POA: Diagnosis not present

## 2019-02-19 DIAGNOSIS — M160 Bilateral primary osteoarthritis of hip: Secondary | ICD-10-CM | POA: Diagnosis not present

## 2019-02-19 DIAGNOSIS — K219 Gastro-esophageal reflux disease without esophagitis: Secondary | ICD-10-CM | POA: Diagnosis not present

## 2019-02-19 DIAGNOSIS — F419 Anxiety disorder, unspecified: Secondary | ICD-10-CM | POA: Diagnosis not present

## 2019-02-19 DIAGNOSIS — Z720 Tobacco use: Secondary | ICD-10-CM | POA: Diagnosis not present

## 2019-02-25 DIAGNOSIS — M1990 Unspecified osteoarthritis, unspecified site: Secondary | ICD-10-CM | POA: Diagnosis not present

## 2019-02-25 DIAGNOSIS — J449 Chronic obstructive pulmonary disease, unspecified: Secondary | ICD-10-CM | POA: Diagnosis not present

## 2019-02-25 DIAGNOSIS — F419 Anxiety disorder, unspecified: Secondary | ICD-10-CM | POA: Diagnosis not present

## 2019-02-25 DIAGNOSIS — Z125 Encounter for screening for malignant neoplasm of prostate: Secondary | ICD-10-CM | POA: Diagnosis not present

## 2019-02-25 DIAGNOSIS — E785 Hyperlipidemia, unspecified: Secondary | ICD-10-CM | POA: Diagnosis not present

## 2019-02-25 DIAGNOSIS — Z79899 Other long term (current) drug therapy: Secondary | ICD-10-CM | POA: Diagnosis not present

## 2019-02-25 DIAGNOSIS — I714 Abdominal aortic aneurysm, without rupture: Secondary | ICD-10-CM | POA: Diagnosis not present

## 2019-02-25 DIAGNOSIS — I10 Essential (primary) hypertension: Secondary | ICD-10-CM | POA: Diagnosis not present

## 2019-03-04 DIAGNOSIS — I10 Essential (primary) hypertension: Secondary | ICD-10-CM | POA: Diagnosis not present

## 2019-03-04 DIAGNOSIS — E785 Hyperlipidemia, unspecified: Secondary | ICD-10-CM | POA: Diagnosis not present

## 2019-03-04 DIAGNOSIS — I714 Abdominal aortic aneurysm, without rupture: Secondary | ICD-10-CM | POA: Diagnosis not present

## 2019-03-07 ENCOUNTER — Telehealth: Payer: Self-pay

## 2019-03-07 ENCOUNTER — Ambulatory Visit: Payer: Medicare HMO

## 2019-03-07 ENCOUNTER — Other Ambulatory Visit: Payer: Self-pay

## 2019-03-07 DIAGNOSIS — E7800 Pure hypercholesterolemia, unspecified: Secondary | ICD-10-CM

## 2019-03-07 NOTE — Telephone Encounter (Signed)
Called and spoke w/pt regarding cholesterol appt and he stated that his pcp is following his cholesterol and wishes not to come to the lipid clinic

## 2019-03-07 NOTE — Progress Notes (Deleted)
Patient ID: Jeremy Mclean                 DOB: 03/22/1950                    MRN: [id number]     HPI: Jeremy Mclean is a 69 y.o. male patient referred to lipid clinic by Dr Gwenlyn Found. PMH is significant for illiac aneurysm resection and grafting, endoluminal stent, hyperlipidemia, and hypertension.   Current Medications:   Intolerances:   Risk Factors:   LDL goal: 70mg /dL  Diet:   Exercise:   Family History:   Social History:   Labs:  Past Medical History:  Diagnosis Date  . AAA (abdominal aortic aneurysm) ([place])   . Anxiety   . Arthritis   . COPD (chronic obstructive pulmonary disease) ([place])   . Degenerative disorder of bone   . Depression   . Diverticulitis   . Dyspnea    due to COPD and when walking long distances per patient  . GERD (gastroesophageal reflux disease)   . History of kidney stones   . Hypertension   . Kidney stone     Current Outpatient Medications on File Prior to Visit  Medication Sig Dispense Refill  . albuterol (PROVENTIL HFA;VENTOLIN HFA) 108 (90 Base) MCG/ACT inhaler Inhale 1-2 puffs into the lungs every 6 (six) hours as needed for wheezing or shortness of breath.    Marland Kitchen amLODipine (NORVASC) 10 MG tablet Take 10 mg by mouth every evening.   4  . atorvastatin (LIPITOR) 40 MG tablet Take 1 tablet (40 mg total) by mouth daily. 90 tablet 3  . BREO ELLIPTA 200-25 MCG/INH AEPB Inhale 1 puff into the lungs every evening.   12  . diazepam (VALIUM) 5 MG tablet Take 5 mg by mouth at bedtime.     . diphenhydrAMINE (BENADRYL) 25 MG tablet Take 25 mg by mouth at bedtime. With Diazepam    . diphenhydrAMINE (BENADRYL) 50 MG capsule Take 50 mg by mouth 1 hour prior to your procedure. 1 capsule 0  . lisinopril (PRINIVIL,ZESTRIL) 40 MG tablet Take 40 mg by mouth every evening.   4  . oxyCODONE-acetaminophen (PERCOCET/ROXICET) 5-325 MG tablet Take 1-2 tablets by mouth every 4 (four) hours as needed for moderate pain. 25 tablet 0  . pantoprazole (PROTONIX) 40 MG  tablet Take 1 tablet (40 mg total) by mouth 2 (two) times daily before a meal. (Patient taking differently: Take 40 mg by mouth 2 (two) times daily as needed (acid reflux/indigestion.). ) 60 tablet 2  . predniSONE (DELTASONE) 50 MG tablet One tablet (50mg ) 13 hours prior to procedure; one tablet (50mg ) 7 hours prior to procedure and then one tablet (50 mg) one hour prior to procedure. 3 tablet 0  . traMADol (ULTRAM) 50 MG tablet Take 50 mg by mouth 3 (three) times daily.      Current Facility-Administered Medications on File Prior to Visit  Medication Dose Route Frequency Provider Last Rate Last Admin  . rabies immune globulin (HYPERAB) injection 1,720 Units  1,720 Units Intramuscular Once Jennye Boroughs M, PA-C      . rabies vaccine (RABAVERT/IMOVAX) injection 1 mL  1 mL Intramuscular Once Duaine Dredge, PA-C        Allergies  Allergen Reactions  . Contrast Media [Iodinated Diagnostic Agents] Other (See Comments)    Burning, needs pre meds  . Iohexol Hives, Itching and Other (See Comments)    Burning. Needs pre-meds   . Prednisone  Other (See Comments)    Agitation and hyperactivity.  Per patient, he can take small doses for IV Dye Prep.    No problem-specific Assessment & Plan notes found for this encounter.      Zachariah Pavek Rodriguez-Guzman PharmD, BCPS, [place] [address] [place] [address] 03/07/2019 1:24 PM

## 2019-03-19 ENCOUNTER — Encounter (INDEPENDENT_AMBULATORY_CARE_PROVIDER_SITE_OTHER): Payer: Self-pay | Admitting: *Deleted

## 2019-05-13 ENCOUNTER — Telehealth (HOSPITAL_COMMUNITY): Payer: Self-pay

## 2019-05-13 NOTE — Telephone Encounter (Signed)

## 2019-05-14 ENCOUNTER — Ambulatory Visit (INDEPENDENT_AMBULATORY_CARE_PROVIDER_SITE_OTHER): Payer: Medicare HMO | Admitting: Vascular Surgery

## 2019-05-14 ENCOUNTER — Encounter: Payer: Self-pay | Admitting: Vascular Surgery

## 2019-05-14 ENCOUNTER — Other Ambulatory Visit: Payer: Self-pay

## 2019-05-14 VITALS — BP 121/70 | HR 70 | Temp 97.7°F | Resp 20 | Ht 70.0 in | Wt 210.0 lb

## 2019-05-14 DIAGNOSIS — I713 Abdominal aortic aneurysm, ruptured, unspecified: Secondary | ICD-10-CM

## 2019-05-14 NOTE — Progress Notes (Signed)
Vascular and Vein Specialist of [facility]  Patient name: Jeremy Mclean MRN: [id number] DOB: Aug 08, 1950 Sex: male  REASON FOR VISIT: Follow-up open repair of abdominal and iliac artery aneurysms  HPI: Jeremy Mclean is a 69 y.o. male here today for follow-up.  He underwent aorto to left common iliac and right external iliac bypass for aneurysmal disease on 01/16/2019.  He did well and was discharged home without complication.  He reports that he has returned to his baseline weight and has actually potentially gained a little weight more than he had preoperatively.  He reports that he is sawing wood and splitting wood and return to full function.  He does have some chronic left arm and leg weakness due to cervical disease  Past Medical History:  Diagnosis Date  . AAA (abdominal aortic aneurysm) ([place])   . Anxiety   . Arthritis   . COPD (chronic obstructive pulmonary disease) ([place])   . Degenerative disorder of bone   . Depression   . Diverticulitis   . Dyspnea    due to COPD and when walking long distances per patient  . GERD (gastroesophageal reflux disease)   . History of kidney stones   . Hypertension   . Kidney stone     Family History  Problem Relation Age of Onset  . Diabetes Mother   . Cancer Father   . Cancer Other   . Diabetes Other     SOCIAL HISTORY: Social History   Tobacco Use  . Smoking status: Current Every Day Smoker    Packs/day: 1.00    Years: 40.00    Pack years: 40.00    Types: Cigarettes  . Smokeless tobacco: Current User    Types: Chew  Substance Use Topics  . Alcohol use: No    Alcohol/week: 0.0 standard drinks    Allergies  Allergen Reactions  . Contrast Media [Iodinated Diagnostic Agents] Other (See Comments)    Burning, needs pre meds  . Iohexol Hives, Itching and Other (See Comments)    Burning. Needs pre-meds   . Prednisone Other (See Comments)    Agitation and hyperactivity.  Per patient, he  can take small doses for IV Dye Prep.    Current Outpatient Medications  Medication Sig Dispense Refill  . albuterol (PROVENTIL HFA;VENTOLIN HFA) 108 (90 Base) MCG/ACT inhaler Inhale 1-2 puffs into the lungs every 6 (six) hours as needed for wheezing or shortness of breath.    Marland Kitchen amLODipine (NORVASC) 10 MG tablet Take 10 mg by mouth every evening.   4  . BREO ELLIPTA 200-25 MCG/INH AEPB Inhale 1 puff into the lungs every evening.   12  . diazepam (VALIUM) 5 MG tablet Take 5 mg by mouth at bedtime.     . diphenhydrAMINE (BENADRYL) 25 MG tablet Take 25 mg by mouth at bedtime. With Diazepam    . ezetimibe (ZETIA) 10 MG tablet     . lisinopril (PRINIVIL,ZESTRIL) 40 MG tablet Take 40 mg by mouth every evening.   4  . traMADol (ULTRAM) 50 MG tablet Take 50 mg by mouth 3 (three) times daily.     . diphenhydrAMINE (BENADRYL) 50 MG capsule Take 50 mg by mouth 1 hour prior to your procedure. (Patient not taking: Reported on 05/14/2019) 1 capsule 0  . pantoprazole (PROTONIX) 40 MG tablet Take 1 tablet (40 mg total) by mouth 2 (two) times daily before a meal. (Patient not taking: Reported on 05/14/2019) 60 tablet 2  . predniSONE (DELTASONE) 50  MG tablet One tablet (50mg ) 13 hours prior to procedure; one tablet (50mg ) 7 hours prior to procedure and then one tablet (50 mg) one hour prior to procedure. (Patient not taking: Reported on 05/14/2019) 3 tablet 0   No current facility-administered medications for this visit.   Facility-Administered Medications Ordered in Other Visits  Medication Dose Route Frequency Provider Last Rate Last Admin  . rabies immune globulin (HYPERAB) injection 1,720 Units  1,720 Units Intramuscular Once Jennye Boroughs M, PA-C      . rabies vaccine (RABAVERT/IMOVAX) injection 1 mL  1 mL Intramuscular Once Duaine Dredge, PA-C        REVIEW OF SYSTEMS:  [X]  denotes positive finding, [ ]  denotes negative finding Cardiac  Comments:  Chest pain or chest pressure:    Shortness of  breath upon exertion:    Short of breath when lying flat:    Irregular heart rhythm:        Vascular    Pain in calf, thigh, or hip brought on by ambulation:    Pain in feet at night that wakes you up from your sleep:     Blood clot in your veins:    Leg swelling:           PHYSICAL EXAM: Vitals:   05/14/19 1010  BP: 121/70  Pulse: 70  Resp: 20  Temp: 97.7 F (36.5 C)  SpO2: 95%  Weight: 210 lb (95.3 kg)  Height: 5\' 10"  (1.778 m)    GENERAL: The patient is a well-nourished male, in no acute distress. The vital signs are documented above. CARDIOVASCULAR: 2+ radial and 2+ dorsalis pedis pulses bilaterally.  Abdominal incision well-healed with no evidence of ventral incisional hernia PULMONARY: There is good air exchange  MUSCULOSKELETAL: There are no major deformities or cyanosis. NEUROLOGIC: No focal weakness or paresthesias are detected. SKIN: There are no ulcers or rashes noted. PSYCHIATRIC: The patient has a normal affect.  DATA:  None  MEDICAL ISSUES: Stable 35-month status post open aneurysm repair.  We will see him again at 1 year out from the surgery for ankle arm index and final follow-up.    Rosetta Posner, MD FACS [facility] Tel [phone] Pager [phone]

## 2019-05-16 ENCOUNTER — Other Ambulatory Visit: Payer: Self-pay | Admitting: *Deleted

## 2019-05-16 DIAGNOSIS — I723 Aneurysm of iliac artery: Secondary | ICD-10-CM

## 2019-05-27 DIAGNOSIS — E785 Hyperlipidemia, unspecified: Secondary | ICD-10-CM | POA: Diagnosis not present

## 2019-06-03 DIAGNOSIS — I714 Abdominal aortic aneurysm, without rupture: Secondary | ICD-10-CM | POA: Diagnosis not present

## 2019-06-03 DIAGNOSIS — I10 Essential (primary) hypertension: Secondary | ICD-10-CM | POA: Diagnosis not present

## 2019-06-03 DIAGNOSIS — E785 Hyperlipidemia, unspecified: Secondary | ICD-10-CM | POA: Diagnosis not present

## 2019-07-30 ENCOUNTER — Other Ambulatory Visit: Payer: Self-pay

## 2019-07-30 ENCOUNTER — Other Ambulatory Visit (HOSPITAL_COMMUNITY): Payer: Self-pay | Admitting: Internal Medicine

## 2019-07-30 ENCOUNTER — Ambulatory Visit (HOSPITAL_COMMUNITY)
Admission: RE | Admit: 2019-07-30 | Discharge: 2019-07-30 | Disposition: A | Discharge status: Home / Self Care | Payer: Medicare HMO | Source: Ambulatory Visit | Attending: Internal Medicine | Admitting: Internal Medicine

## 2019-07-30 DIAGNOSIS — M545 Low back pain, unspecified: Secondary | ICD-10-CM

## 2019-07-30 DIAGNOSIS — M1611 Unilateral primary osteoarthritis, right hip: Secondary | ICD-10-CM | POA: Diagnosis not present

## 2019-07-30 DIAGNOSIS — M25551 Pain in right hip: Secondary | ICD-10-CM

## 2019-08-21 ENCOUNTER — Other Ambulatory Visit: Payer: Self-pay

## 2019-08-21 ENCOUNTER — Ambulatory Visit (INDEPENDENT_AMBULATORY_CARE_PROVIDER_SITE_OTHER): Payer: Medicare HMO | Admitting: Orthopaedic Surgery

## 2019-08-21 ENCOUNTER — Encounter: Payer: Self-pay | Admitting: Orthopaedic Surgery

## 2019-08-21 DIAGNOSIS — G8929 Other chronic pain: Secondary | ICD-10-CM

## 2019-08-21 DIAGNOSIS — M545 Low back pain, unspecified: Secondary | ICD-10-CM | POA: Insufficient documentation

## 2019-08-21 DIAGNOSIS — M4807 Spinal stenosis, lumbosacral region: Secondary | ICD-10-CM | POA: Diagnosis not present

## 2019-08-21 DIAGNOSIS — M5442 Lumbago with sciatica, left side: Secondary | ICD-10-CM

## 2019-08-21 DIAGNOSIS — M5441 Lumbago with sciatica, right side: Secondary | ICD-10-CM

## 2019-08-21 NOTE — Addendum Note (Signed)
Addended by: Meyer Cory on: 08/21/2019 01:34 PM   Modules accepted: Orders
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
## 2019-09-02 DIAGNOSIS — J310 Chronic rhinitis: Secondary | ICD-10-CM | POA: Diagnosis not present

## 2019-09-02 DIAGNOSIS — M545 Low back pain: Secondary | ICD-10-CM | POA: Diagnosis not present

## 2019-09-02 DIAGNOSIS — I10 Essential (primary) hypertension: Secondary | ICD-10-CM | POA: Diagnosis not present

## 2019-09-04 ENCOUNTER — Ambulatory Visit: Payer: Medicare HMO | Admitting: Orthopaedic Surgery

## 2019-09-04 ENCOUNTER — Other Ambulatory Visit: Payer: Self-pay

## 2019-09-13 ENCOUNTER — Ambulatory Visit (HOSPITAL_COMMUNITY)
Admission: RE | Admit: 2019-09-13 | Discharge: 2019-09-13 | Disposition: A | Discharge status: Home / Self Care | Payer: Medicare HMO | Source: Ambulatory Visit | Attending: Orthopaedic Surgery | Admitting: Orthopaedic Surgery

## 2019-09-13 ENCOUNTER — Other Ambulatory Visit: Payer: Self-pay

## 2019-09-13 DIAGNOSIS — I714 Abdominal aortic aneurysm, without rupture: Secondary | ICD-10-CM | POA: Diagnosis not present

## 2019-09-13 DIAGNOSIS — M4807 Spinal stenosis, lumbosacral region: Secondary | ICD-10-CM | POA: Diagnosis not present

## 2019-09-13 DIAGNOSIS — G8929 Other chronic pain: Secondary | ICD-10-CM | POA: Insufficient documentation

## 2019-09-13 DIAGNOSIS — M5441 Lumbago with sciatica, right side: Secondary | ICD-10-CM | POA: Diagnosis not present

## 2019-09-13 DIAGNOSIS — M5442 Lumbago with sciatica, left side: Secondary | ICD-10-CM | POA: Diagnosis not present

## 2019-09-13 DIAGNOSIS — M48061 Spinal stenosis, lumbar region without neurogenic claudication: Secondary | ICD-10-CM | POA: Diagnosis not present

## 2019-09-13 DIAGNOSIS — I700 Atherosclerosis of aorta: Secondary | ICD-10-CM | POA: Diagnosis not present

## 2019-09-13 DIAGNOSIS — I723 Aneurysm of iliac artery: Secondary | ICD-10-CM | POA: Diagnosis not present

## 2019-09-18 ENCOUNTER — Other Ambulatory Visit: Payer: Self-pay

## 2019-09-18 ENCOUNTER — Encounter: Payer: Self-pay | Admitting: Orthopaedic Surgery

## 2019-09-18 ENCOUNTER — Ambulatory Visit (INDEPENDENT_AMBULATORY_CARE_PROVIDER_SITE_OTHER): Payer: Medicare HMO | Admitting: Orthopaedic Surgery

## 2019-09-18 VITALS — Ht 70.0 in | Wt 212.0 lb

## 2019-09-18 DIAGNOSIS — G8929 Other chronic pain: Secondary | ICD-10-CM | POA: Diagnosis not present

## 2019-09-18 DIAGNOSIS — M5441 Lumbago with sciatica, right side: Secondary | ICD-10-CM | POA: Diagnosis not present

## 2019-09-18 DIAGNOSIS — M5442 Lumbago with sciatica, left side: Secondary | ICD-10-CM

## 2019-09-18 NOTE — Progress Notes (Signed)
Office Visit Note   Patient: Jeremy Mclean           Date of Birth: May 04, 1950           MRN: [id number] Visit Date: 09/18/2019              Requested by: Asencion Noble, MD [address] [place],  [place] [address] PCP: Asencion Noble, MD   Assessment & Plan: Visit Diagnoses:  1. Chronic bilateral low back pain with bilateral sciatica     Plan: Jeremy Mclean had a CT scan of his lumbar spine that revealed no evidence of a compression fracture.  He does have multilevel discogenic and facet degenerative changes throughout the lumbar spine with maximal changes at L3-4 associated with severe canal stenosis.  At that level there is also severe left and moderate right foraminal narrowing and effacement of the lateral recesses.  He has moderate spinal stenosis at L2-3 and L4-5 and severe right neural foraminal narrowing at L5-S1.  He has had prior abdominal aortic aneurysm repair.  No change in symptoms.  Long discussion regarding the CT scan and treatment options including therapy and epidural steroid injection.  At this point he was happy to know the results of the CT scan and does not want to proceed with any further treatment.  He has a set of home exercises for his back.  He will think about the epidural steroid injection and let me know.  He is able to function fairly well around his house driving his jeep and/or [place] and staying [place] his feet  Follow-Up Instructions: Return if symptoms worsen or fail to improve.   Orders:  No orders of the defined types were placed in this encounter.  No orders of the defined types were placed in this encounter.     Procedures: No procedures performed   Clinical Data: No additional findings.   Subjective: Chief Complaint  Patient presents with  . Lower Back - Pain, Follow-up    CT Lumbar Review   Patient returns to review CT Lumbar Spine. He denies any change in his symptoms.  Still having back and lower extremity  claudication when he is or walking for any distance.  He is able to compromise exact if it is around the forearm by using his tractor or a jeep. He has had a prior stroke with some residual left upper and lower extremity weakness.  However, does not seem to have any functional limitations  HPI  Review of Systems   Objective: Vital Signs: Ht 5\' 10"  (1.778 m)   Wt (!) 212 lb (96.2 kg)   BMI 30.42 kg/m   Physical Exam Constitutional:      Appearance: He is well-developed.  Eyes:     Pupils: Pupils are equal, round, and reactive to light.  Pulmonary:     Effort: Pulmonary effort is normal.  Skin:    General: Skin is warm and dry.  Neurological:     Mental Status: He is alert and oriented to person, place, and time.  Psychiatric:        Behavior: Behavior normal.     Ortho Exam straight leg raise was negative bilaterally.  Painless range of motion of both hips.  No percussible tenderness of the lumbar spine.  Completely asymptomatic sitting  Specialty Comments:  No specialty comments available.  Imaging: No results found.   PMFS History: Patient Active Problem List   Diagnosis Date Noted  . Low back pain  08/21/2019  . AAA (abdominal aortic aneurysm) ([place]) 01/16/2019  . UGIB (upper gastrointestinal bleed)   . Acute blood loss anemia 01/13/2017  . Melena 01/13/2017  . Precordial chest pain 01/13/2017  . Iliac artery aneurysm ([place]) 07/11/2014  . Tobacco abuse 07/11/2014  . History of colonic polyps 03/18/2014  . Abdominal aortic aneurysm ([place]) 03/18/2014  . Essential hypertension 03/18/2014   Past Medical History:  Diagnosis Date  . AAA (abdominal aortic aneurysm) ([place])   . Anxiety   . Arthritis   . COPD (chronic obstructive pulmonary disease) ([place])   . Degenerative disorder of bone   . Depression   . Diverticulitis   . Dyspnea    due to COPD and when walking long distances per patient  . GERD (gastroesophageal reflux disease)   . History of kidney stones     . Hypertension   . Kidney stone     Family History  Problem Relation Age of Onset  . Diabetes Mother   . Cancer Father   . Cancer Other   . Diabetes Other     Past Surgical History:  Procedure Laterality Date  . ABDOMINAL AORTIC ANEURYSM REPAIR N/A 01/16/2019   Procedure: OPEN REPAIR OF ABDOMINAL AORTIC ANEURYSM;  Surgeon: Rosetta Posner, MD;  Location: [facility];  Service: Vascular;  Laterality: N/A;  . BIOPSY  01/14/2017   Procedure: BIOPSY;  Surgeon: Danie Binder, MD;  [facility];  Service: Endoscopy;;  gastric   . CERVICAL SPINE SURGERY     C6-7 ACDF 09/22/09  . COLONOSCOPY N/A 04/09/2014   Procedure: COLONOSCOPY;  Surgeon: Rogene Houston, MD;  [facility];  Service: Endoscopy;  Laterality: N/A;  730 - moved to 7:30 - Ann to notify pt  . CYSTOSCOPY     with stent  . ESOPHAGOGASTRODUODENOSCOPY N/A 01/14/2017   Procedure: ESOPHAGOGASTRODUODENOSCOPY (EGD);  Surgeon: Danie Binder, MD;  [facility];  Service: Endoscopy;  Laterality: N/A;  . kidney stent     Social History   Occupational History  . Not on file  Tobacco Use  . Smoking status: Current Every Day Smoker    Packs/day: 1.00    Years: 40.00    Pack years: 40.00    Types: Cigarettes  . Smokeless tobacco: Current User    Types: Chew  Vaping Use  . Vaping Use: Never used  Substance and Sexual Activity  . Alcohol use: No    Alcohol/week: 0.0 standard drinks  . Drug use: No  . Sexual activity: Yes    Birth control/protection: None

## 2019-12-17 DIAGNOSIS — J441 Chronic obstructive pulmonary disease with (acute) exacerbation: Secondary | ICD-10-CM | POA: Diagnosis not present

## 2019-12-17 DIAGNOSIS — J449 Chronic obstructive pulmonary disease, unspecified: Secondary | ICD-10-CM | POA: Diagnosis not present

## 2020-03-16 DIAGNOSIS — J449 Chronic obstructive pulmonary disease, unspecified: Secondary | ICD-10-CM | POA: Diagnosis not present

## 2020-03-16 DIAGNOSIS — F419 Anxiety disorder, unspecified: Secondary | ICD-10-CM | POA: Diagnosis not present

## 2020-03-16 DIAGNOSIS — Z125 Encounter for screening for malignant neoplasm of prostate: Secondary | ICD-10-CM | POA: Diagnosis not present

## 2020-03-16 DIAGNOSIS — M1990 Unspecified osteoarthritis, unspecified site: Secondary | ICD-10-CM | POA: Diagnosis not present

## 2020-03-16 DIAGNOSIS — M48061 Spinal stenosis, lumbar region without neurogenic claudication: Secondary | ICD-10-CM | POA: Diagnosis not present

## 2020-03-16 DIAGNOSIS — I10 Essential (primary) hypertension: Secondary | ICD-10-CM | POA: Diagnosis not present

## 2020-03-16 DIAGNOSIS — Z79899 Other long term (current) drug therapy: Secondary | ICD-10-CM | POA: Diagnosis not present

## 2020-03-16 DIAGNOSIS — E785 Hyperlipidemia, unspecified: Secondary | ICD-10-CM | POA: Diagnosis not present

## 2020-03-16 DIAGNOSIS — K219 Gastro-esophageal reflux disease without esophagitis: Secondary | ICD-10-CM | POA: Diagnosis not present

## 2020-03-23 DIAGNOSIS — I10 Essential (primary) hypertension: Secondary | ICD-10-CM | POA: Diagnosis not present

## 2020-03-23 DIAGNOSIS — J449 Chronic obstructive pulmonary disease, unspecified: Secondary | ICD-10-CM | POA: Diagnosis not present

## 2020-03-23 DIAGNOSIS — I714 Abdominal aortic aneurysm, without rupture: Secondary | ICD-10-CM | POA: Diagnosis not present

## 2020-03-23 DIAGNOSIS — Z23 Encounter for immunization: Secondary | ICD-10-CM | POA: Diagnosis not present

## 2020-03-23 DIAGNOSIS — E785 Hyperlipidemia, unspecified: Secondary | ICD-10-CM | POA: Diagnosis not present

## 2020-05-19 DIAGNOSIS — K2091 Esophagitis, unspecified with bleeding: Secondary | ICD-10-CM | POA: Diagnosis not present

## 2020-06-11 DIAGNOSIS — R140 Abdominal distension (gaseous): Secondary | ICD-10-CM | POA: Diagnosis not present

## 2020-06-29 ENCOUNTER — Encounter (INDEPENDENT_AMBULATORY_CARE_PROVIDER_SITE_OTHER): Payer: Self-pay | Admitting: *Deleted

## 2020-07-10 ENCOUNTER — Other Ambulatory Visit (INDEPENDENT_AMBULATORY_CARE_PROVIDER_SITE_OTHER): Payer: Self-pay

## 2020-07-10 ENCOUNTER — Telehealth (INDEPENDENT_AMBULATORY_CARE_PROVIDER_SITE_OTHER): Payer: Self-pay

## 2020-07-10 DIAGNOSIS — Z86010 Personal history of colonic polyps: Secondary | ICD-10-CM

## 2020-07-10 DIAGNOSIS — Z800 Family history of malignant neoplasm of digestive organs: Secondary | ICD-10-CM

## 2020-07-10 NOTE — Telephone Encounter (Signed)
Referring MD/PCP: Willey Blade  Procedure: Tcs  Reason/Indication:  Hx of colon polyps, Fam Hx of colon ca  Has patient had this procedure before?  yes  If so, when, by whom and where?  03/2014  Is there a family history of colon cancer?  yes  Who?  What age when diagnosed?  father  Is patient diabetic? If yes, Type 1 or Type 2   no      Does patient have prosthetic heart valve or mechanical valve?  no  Do you have a pacemaker/defibrillator?  no  Has patient ever had endocarditis/atrial fibrillation? no  Have you had a stroke/heart attack last 6 mths? no  Does patient use oxygen? no  Has patient had joint replacement within last 12 months?  no  Is patient constipated or do they take laxatives? Yes, constipation  Does patient have a history of alcohol/drug use?  no  Is patient on blood thinner such as Coumadin, Plavix and/or Aspirin? no  Do you take medicine for weight loss?  no  For male patients,: do you still have your menstrual cycle? no  Medications: Amlodipine 10 mg daily, Ezetimibe 10mg  daily, Lisinopril 40 mg daily, Tramadol 50 mg 4 daily, Diazepam 5 mg tid, Breo Ellipta 20 mcg/42mcg, albuterol   Allergies: Contrast dye, prednisone, iohexol  Medication Adjustment per Dr [name]/Dr Jenetta Downer none  Procedure date & time: 08/12/20 12:50

## 2020-07-14 ENCOUNTER — Encounter (INDEPENDENT_AMBULATORY_CARE_PROVIDER_SITE_OTHER): Payer: Self-pay

## 2020-07-14 ENCOUNTER — Telehealth (INDEPENDENT_AMBULATORY_CARE_PROVIDER_SITE_OTHER): Payer: Self-pay

## 2020-07-14 MED ORDER — PEG 3350-KCL-NA BICARB-NACL 420 G PO SOLR: 4000.0000 mL | ORAL | 0 refills | Status: AC

## 2020-07-14 NOTE — Telephone Encounter (Signed)
Jeremy Mclean, CMA  

## 2020-07-22 DIAGNOSIS — M4804 Spinal stenosis, thoracic region: Secondary | ICD-10-CM | POA: Diagnosis not present

## 2020-07-22 DIAGNOSIS — I10 Essential (primary) hypertension: Secondary | ICD-10-CM | POA: Diagnosis not present

## 2020-07-22 DIAGNOSIS — K219 Gastro-esophageal reflux disease without esophagitis: Secondary | ICD-10-CM | POA: Diagnosis not present

## 2020-07-22 DIAGNOSIS — J440 Chronic obstructive pulmonary disease with acute lower respiratory infection: Secondary | ICD-10-CM | POA: Diagnosis not present

## 2020-07-27 ENCOUNTER — Encounter (INDEPENDENT_AMBULATORY_CARE_PROVIDER_SITE_OTHER): Payer: Self-pay

## 2020-08-10 ENCOUNTER — Other Ambulatory Visit (HOSPITAL_COMMUNITY): Payer: Medicare HMO

## 2020-09-02 NOTE — Patient Instructions (Signed)
Jeremy Mclean  09/02/2020     @PREFPERIOPPHARMACY @   Your procedure is scheduled on  09/09/2020.   Report to [facility] at  0730  A.M.   Call this number if you have problems the morning of surgery:  [phone]   Remember:  Follow the diet and prep instructions given to you by the office.    Take these medicines the morning of surgery with A SIP OF WATER         tramadol (if needed)     Do not wear jewelry, make-up or nail polish.  Do not wear lotions, powders, or perfumes, or deodorant.  Do not shave 48 hours prior to surgery.  Men may shave face and neck.  Do not bring valuables to the hospital.  [facility] is not responsible for any belongings or valuables.  Contacts, dentures or bridgework may not be worn into surgery.  Leave your suitcase in the car.  After surgery it may be brought to your room.  For patients admitted to the hospital, discharge time will be determined by your treatment team.  Patients discharged the day of surgery will not be allowed to drive home and must have someone with them for 24 hours.    Special instructions:     DO NOT smoke tobacco or vape for 24 hours before your procedure.  Please read over the following fact sheets that you were given. Anesthesia Post-op Instructions and Care and Recovery After Surgery      Colonoscopy, Adult, Care After This sheet gives you information about how to care for yourself after your procedure. Your health care provider may also give you more specific instructions. If you have problems or questions, contact your health careprovider. What can I expect after the procedure? After the procedure, it is common to have: A small amount of blood in your stool for 24 hours after the procedure. Some gas. Mild cramping or bloating of your abdomen. Follow these instructions at home: Eating and drinking  Drink enough fluid to keep your urine pale yellow. Follow instructions from your health care provider  about eating or drinking restrictions. Resume your normal diet as instructed by your health care provider. Avoid heavy or fried foods that are hard to digest.  Activity Rest as told by your health care provider. Avoid sitting for a long time without moving. Get up to take short walks every 1-2 hours. This is important to improve blood flow and breathing. Ask for help if you feel weak or unsteady. Return to your normal activities as told by your health care provider. Ask your health care provider what activities are safe for you. Managing cramping and bloating  Try walking around when you have cramps or feel bloated. Apply heat to your abdomen as told by your health care provider. Use the heat source that your health care provider recommends, such as a moist heat pack or a heating pad. Place a towel between your skin and the heat source. Leave the heat on for 20-30 minutes. Remove the heat if your skin turns bright red. This is especially important if you are unable to feel pain, heat, or cold. You may have a greater risk of getting burned.  General instructions If you were given a sedative during the procedure, it can affect you for several hours. Do not drive or operate machinery until your health care provider says that it is safe. For the first 24 hours after the procedure:  Do not sign important documents. Do not drink alcohol. Do your regular daily activities at a slower pace than normal. Eat soft foods that are easy to digest. Take over-the-counter and prescription medicines only as told by your health care provider. Keep all follow-up visits as told by your health care provider. This is important. Contact a health care provider if: You have blood in your stool 2-3 days after the procedure. Get help right away if you have: More than a small spotting of blood in your stool. Large blood clots in your stool. Swelling of your abdomen. Nausea or vomiting. A fever. Increasing pain in  your abdomen that is not relieved with medicine. Summary After the procedure, it is common to have a small amount of blood in your stool. You may also have mild cramping and bloating of your abdomen. If you were given a sedative during the procedure, it can affect you for several hours. Do not drive or operate machinery until your health care provider says that it is safe. Get help right away if you have a lot of blood in your stool, nausea or vomiting, a fever, or increased pain in your abdomen. This information is not intended to replace advice given to you by your health care provider. Make sure you discuss any questions you have with your healthcare provider. Document Revised: 02/01/2019 Document Reviewed: 09/03/2018 Elsevier Patient Education  [place] After This sheet gives you information about how to care for yourself after your procedure. Your health care provider may also give you more specific instructions. If you have problems or questions, contact your health careprovider. What can I expect after the procedure? After the procedure, it is common to have: Tiredness. Forgetfulness about what happened after the procedure. Impaired judgment for important decisions. Nausea or vomiting. Some difficulty with balance. Follow these instructions at home: For the time period you were told by your health care provider:     Rest as needed. Do not participate in activities where you could fall or become injured. Do not drive or use machinery. Do not drink alcohol. Do not take sleeping pills or medicines that cause drowsiness. Do not make important decisions or sign legal documents. Do not take care of children on your own. Eating and drinking Follow the diet that is recommended by your health care provider. Drink enough fluid to keep your urine pale yellow. If you vomit: Drink water, juice, or soup when you can drink without vomiting. Make  sure you have little or no nausea before eating solid foods. General instructions Have a responsible adult stay with you for the time you are told. It is important to have someone help care for you until you are awake and alert. Take over-the-counter and prescription medicines only as told by your health care provider. If you have sleep apnea, surgery and certain medicines can increase your risk for breathing problems. Follow instructions from your health care provider about wearing your sleep device: Anytime you are sleeping, including during daytime naps. While taking prescription pain medicines, sleeping medicines, or medicines that make you drowsy. Avoid smoking. Keep all follow-up visits as told by your health care provider. This is important. Contact a health care provider if: You keep feeling nauseous or you keep vomiting. You feel light-headed. You are still sleepy or having trouble with balance after 24 hours. You develop a rash. You have a fever. You have redness or swelling around the IV site. Get help right away  if: You have trouble breathing. You have new-onset confusion at home. Summary For several hours after your procedure, you may feel tired. You may also be forgetful and have poor judgment. Have a responsible adult stay with you for the time you are told. It is important to have someone help care for you until you are awake and alert. Rest as told. Do not drive or operate machinery. Do not drink alcohol or take sleeping pills. Get help right away if you have trouble breathing, or if you suddenly become confused. This information is not intended to replace advice given to you by your health care provider. Make sure you discuss any questions you have with your healthcare provider. Document Revised: 10/24/2019 Document Reviewed: 01/10/2019 Elsevier Patient Education  2022 [employer].

## 2020-09-04 ENCOUNTER — Encounter (HOSPITAL_COMMUNITY): Payer: Self-pay

## 2020-09-04 ENCOUNTER — Encounter (HOSPITAL_COMMUNITY)
Admission: RE | Admit: 2020-09-04 | Discharge: 2020-09-04 | Disposition: A | Discharge status: Home / Self Care | Payer: Medicare HMO | Source: Ambulatory Visit | Attending: Internal Medicine | Admitting: Internal Medicine

## 2020-09-04 NOTE — Pre-Procedure Instructions (Signed)
Interoffice message sent to Darius Bump that patient did not show for PAT today.

## 2020-09-09 ENCOUNTER — Encounter (HOSPITAL_COMMUNITY): Admission: RE | Payer: Self-pay | Source: Home / Self Care

## 2020-09-09 ENCOUNTER — Ambulatory Visit (HOSPITAL_COMMUNITY): Admission: RE | Admit: 2020-09-09 | Payer: Medicare HMO | Source: Home / Self Care | Admitting: Internal Medicine

## 2020-09-09 SURGERY — COLONOSCOPY WITH PROPOFOL
Anesthesia: Monitor Anesthesia Care

## 2020-10-01 IMAGING — DX DG HIP (WITH OR WITHOUT PELVIS) 2-3V*R*
3 series · 3 of 3 positions shown · non-contrast
Comparison: None.

CLINICAL DATA: Right hip pain.

EXAM:
DG HIP (WITH OR WITHOUT PELVIS) 2-3V RIGHT

[pelvis ap]
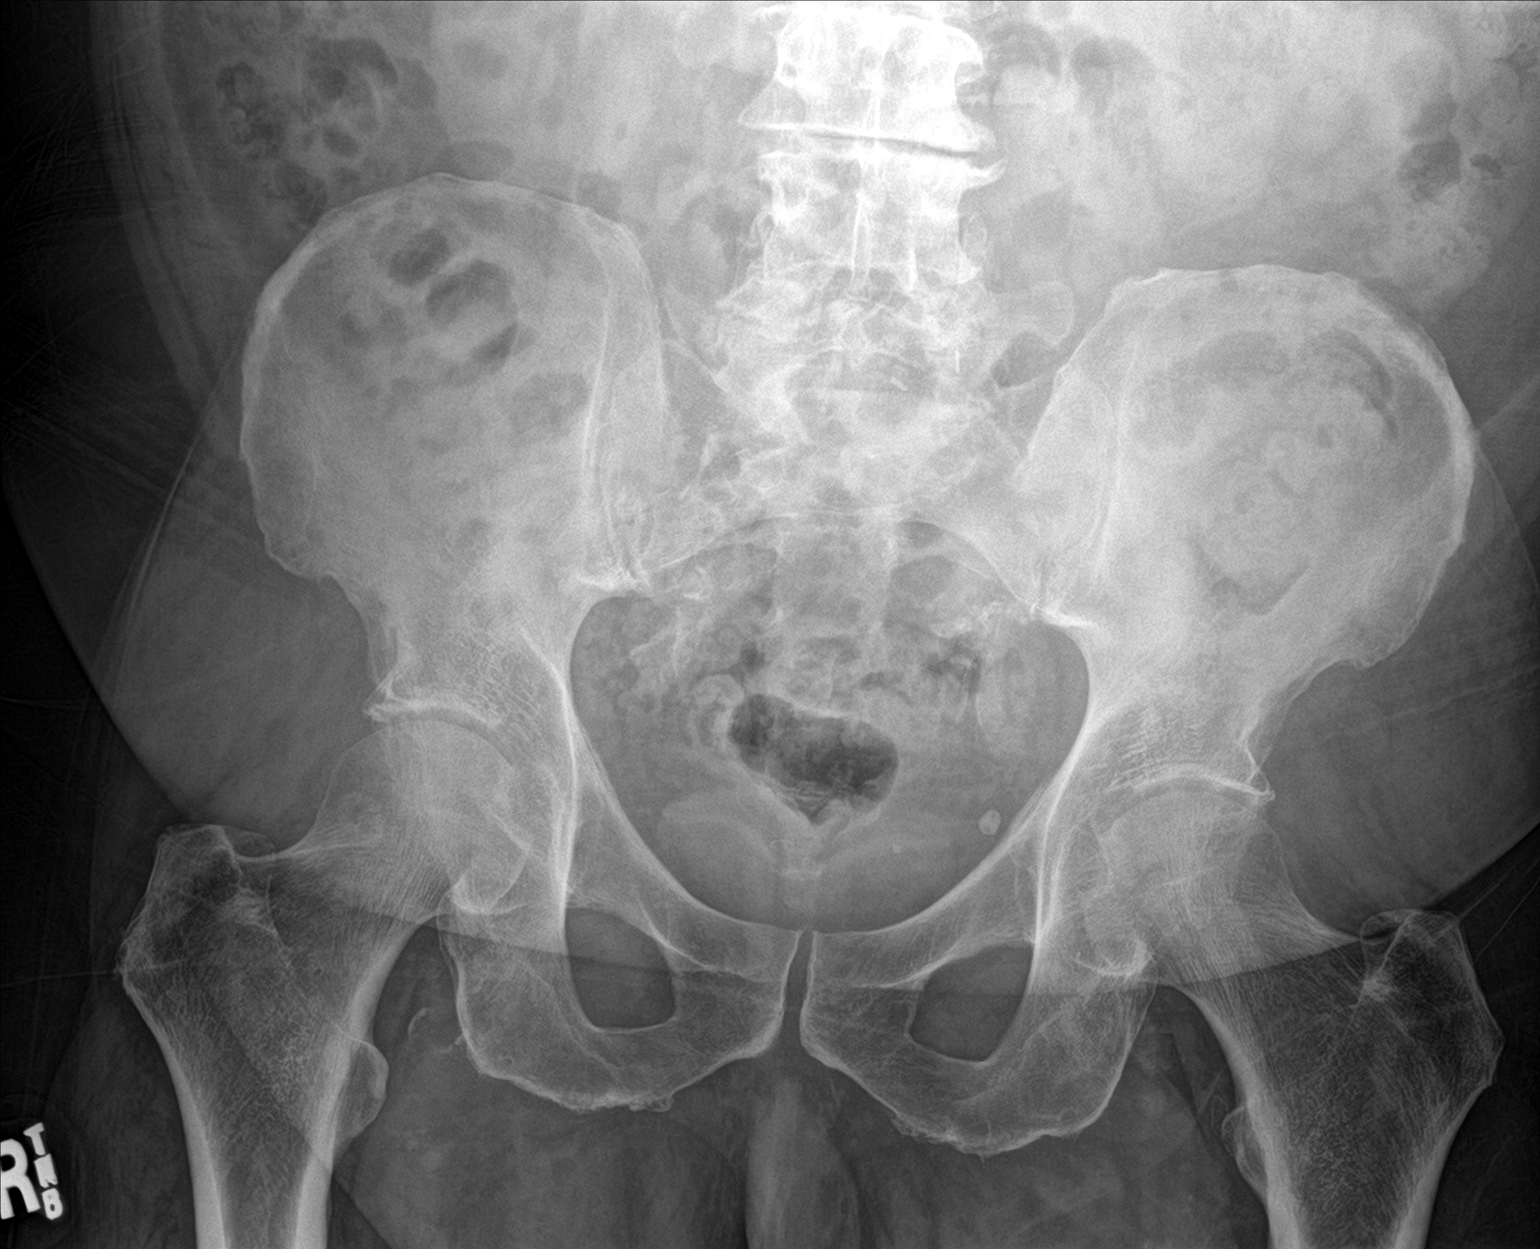

[hip ap]
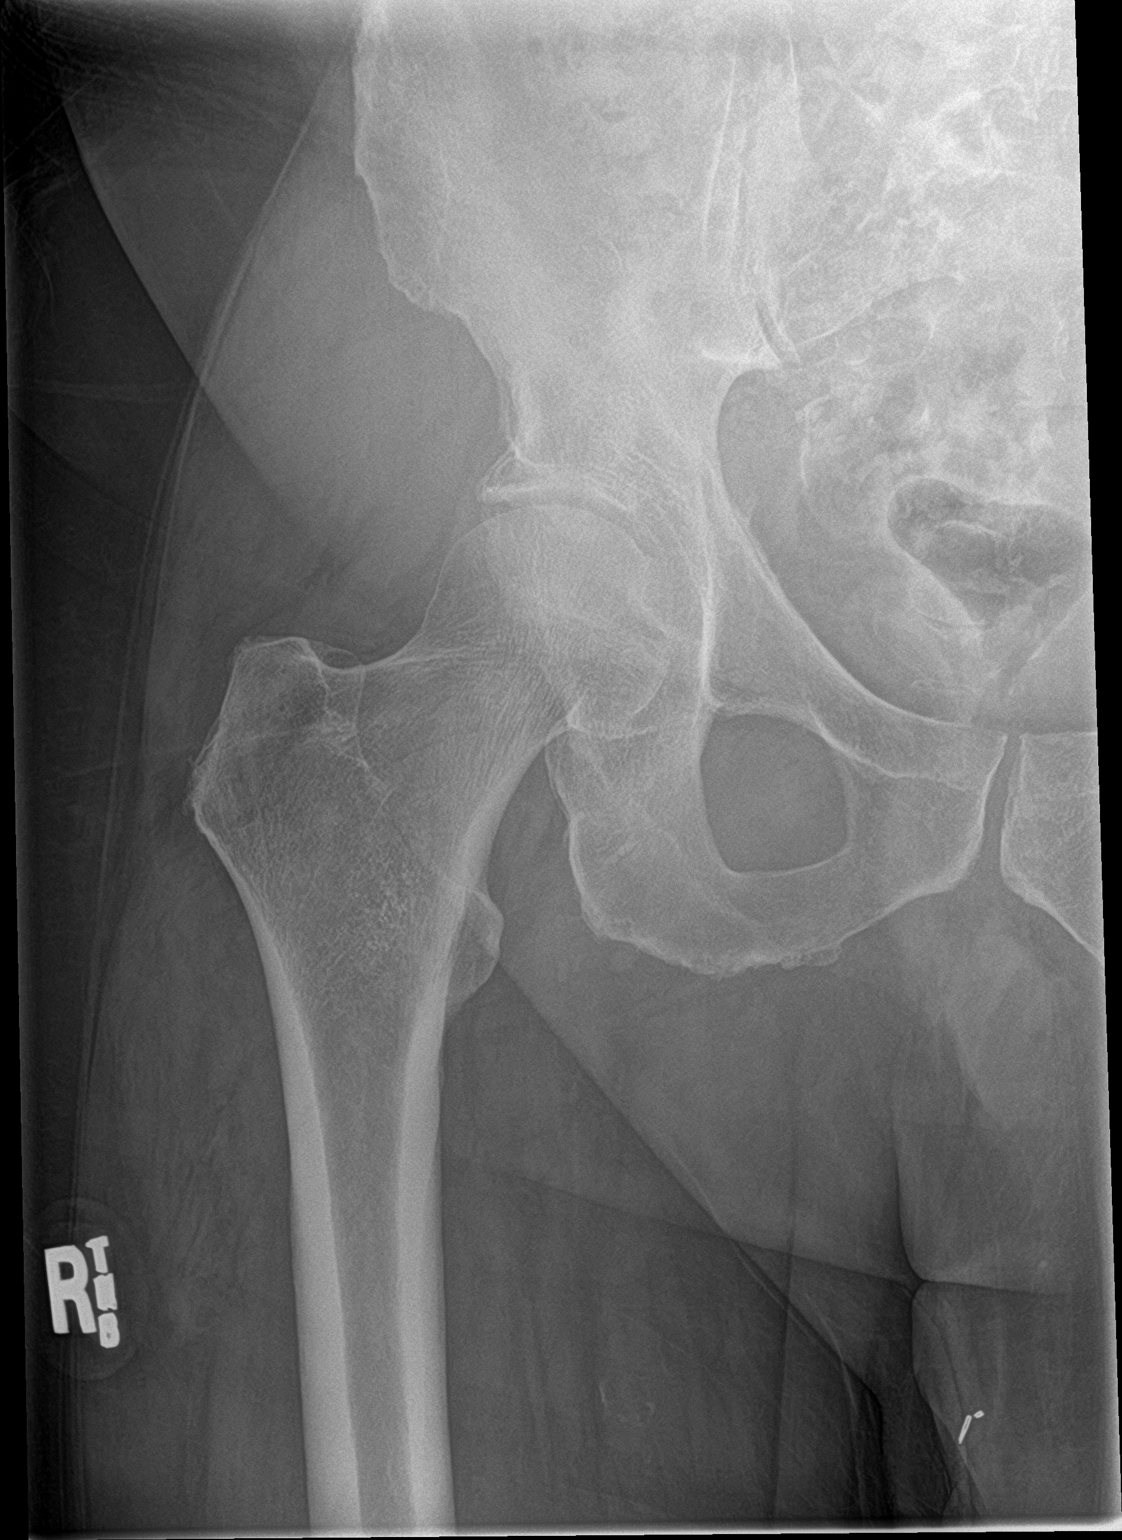

[hip frog leg]
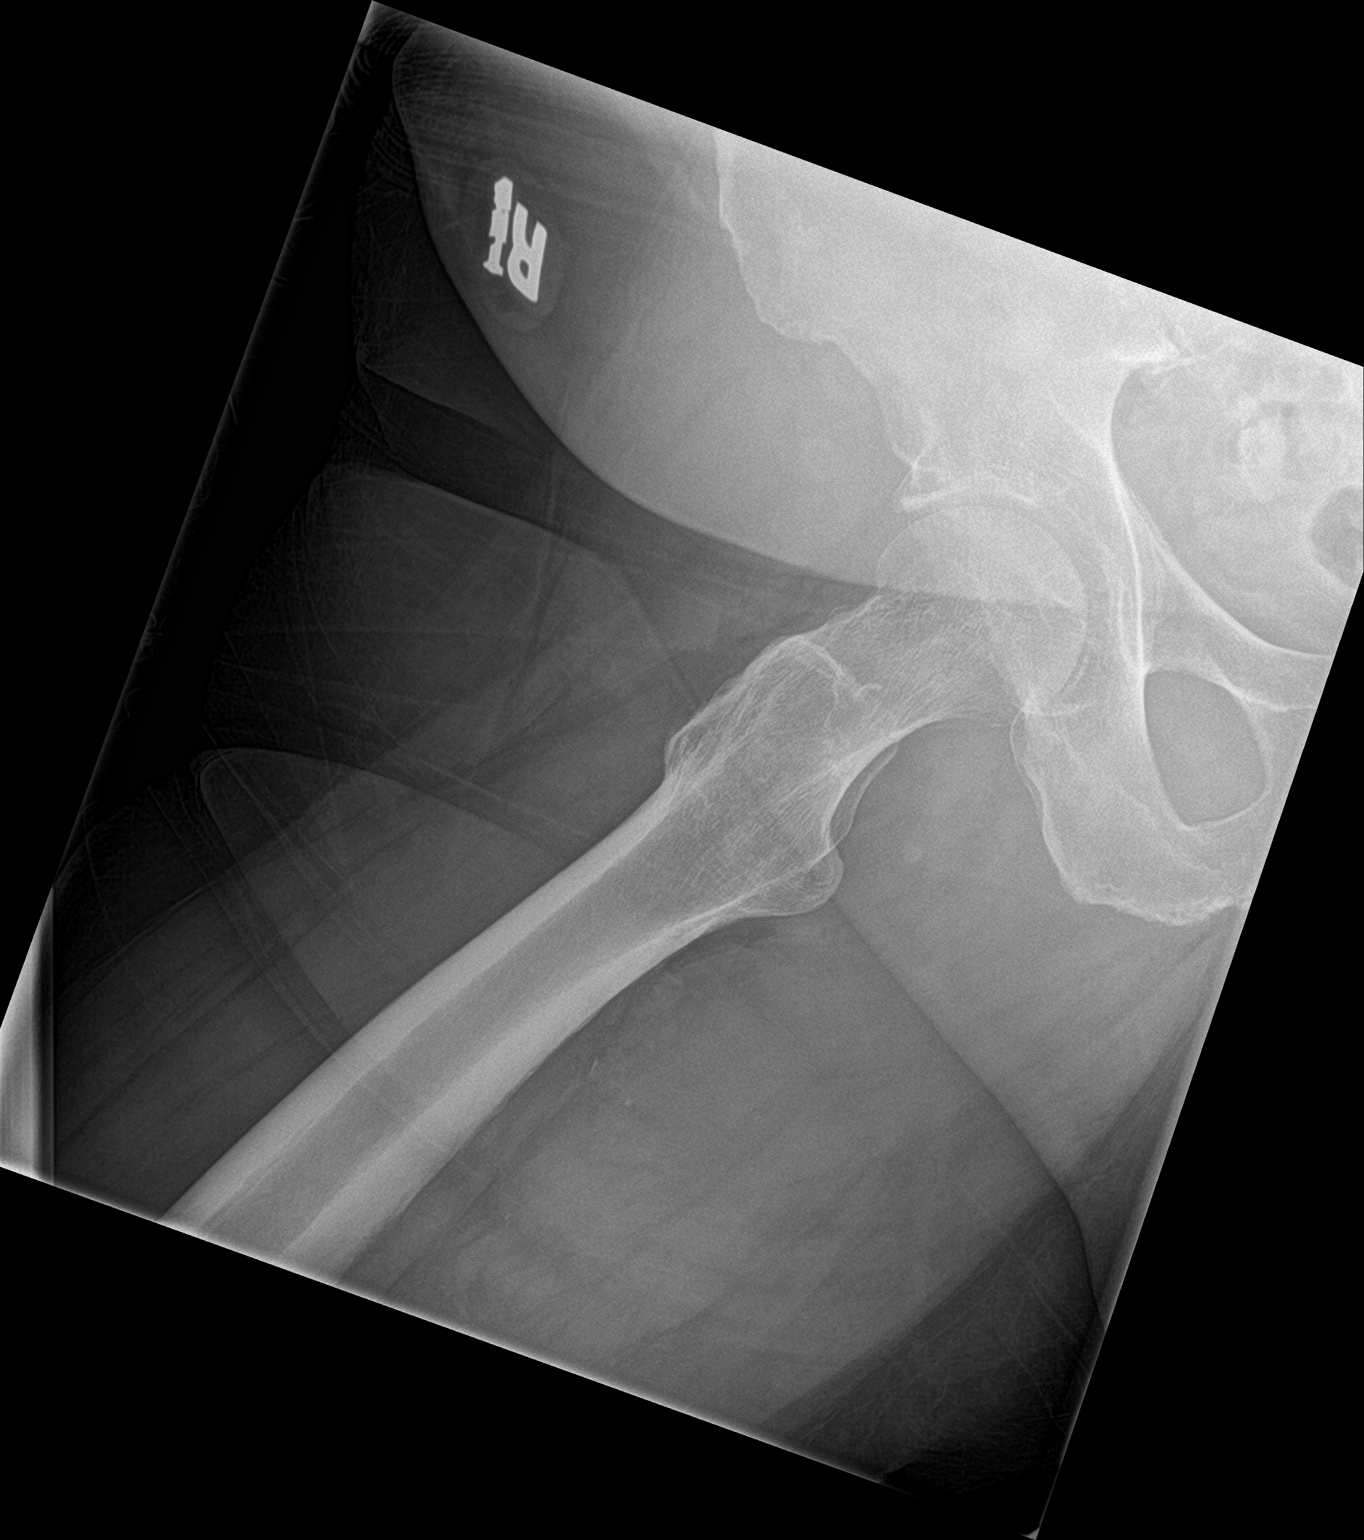

[3 of 3 positions shown; findings below may reference images not displayed]

FINDINGS: Moderate bilateral hip joint degenerative changes but no fracture or
AVN. The pubic symphysis and SI joints are intact. No pelvic
fractures or bone lesions.
IMPRESSION: Moderate bilateral hip joint degenerative changes. No acute bony
findings.

## 2020-10-01 IMAGING — DX DG LUMBAR SPINE 2-3V
3 series · 3 of 3 positions shown · non-contrast
Comparison: CT scan 11/26/2018

CLINICAL DATA: Low back pain.

EXAM:
LUMBAR SPINE - 2-3 VIEW

[l-spine ap]
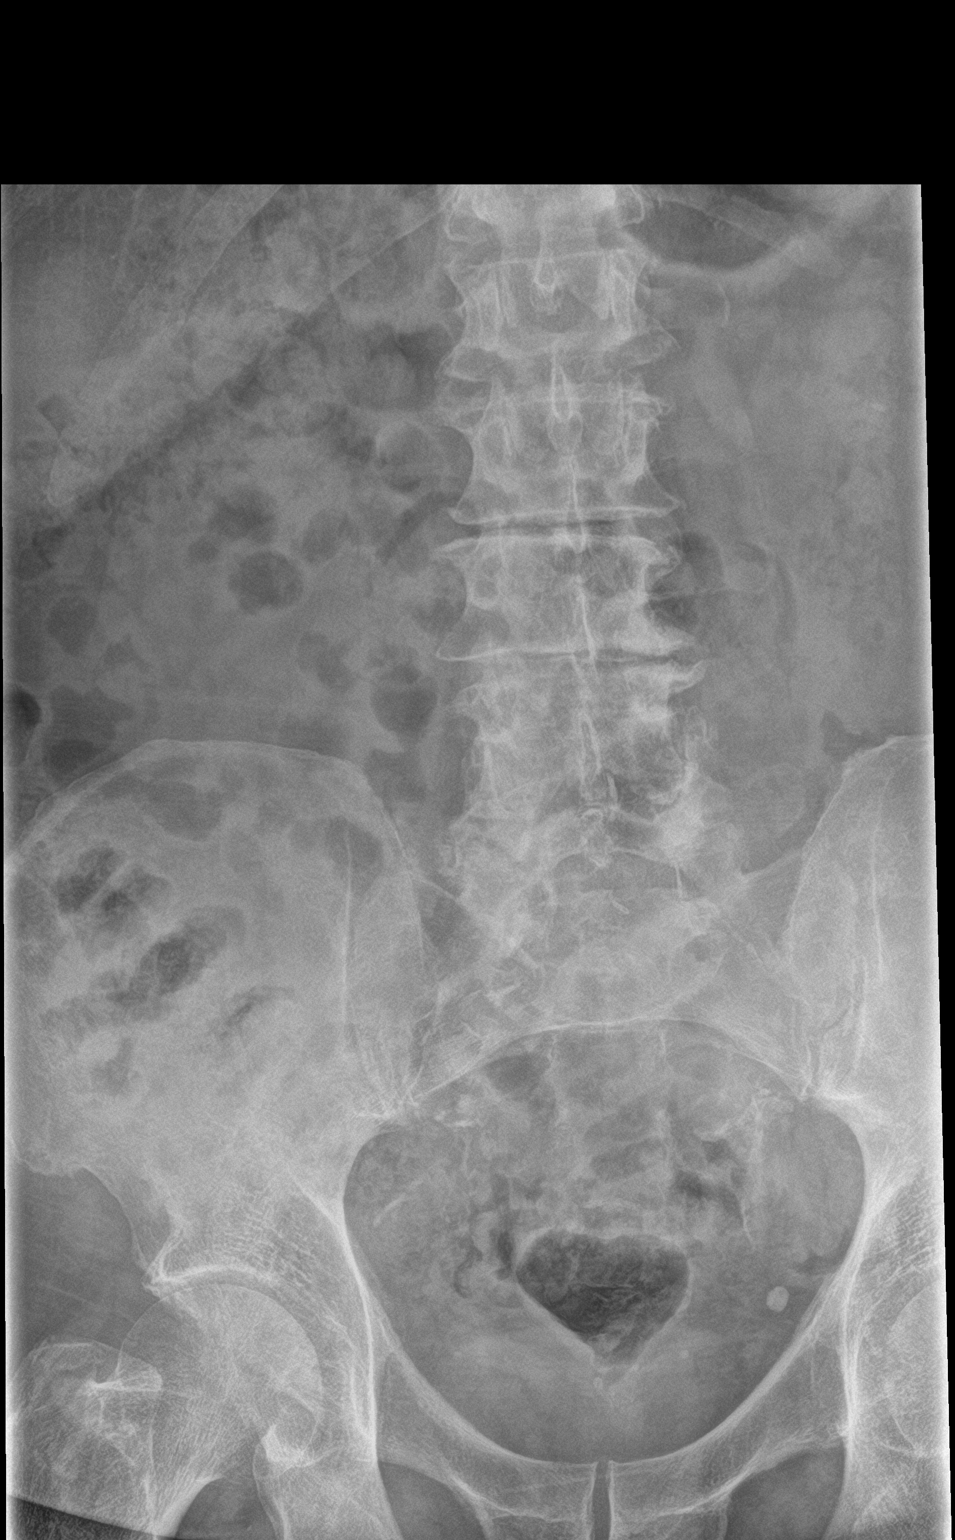

[l-spine lat]
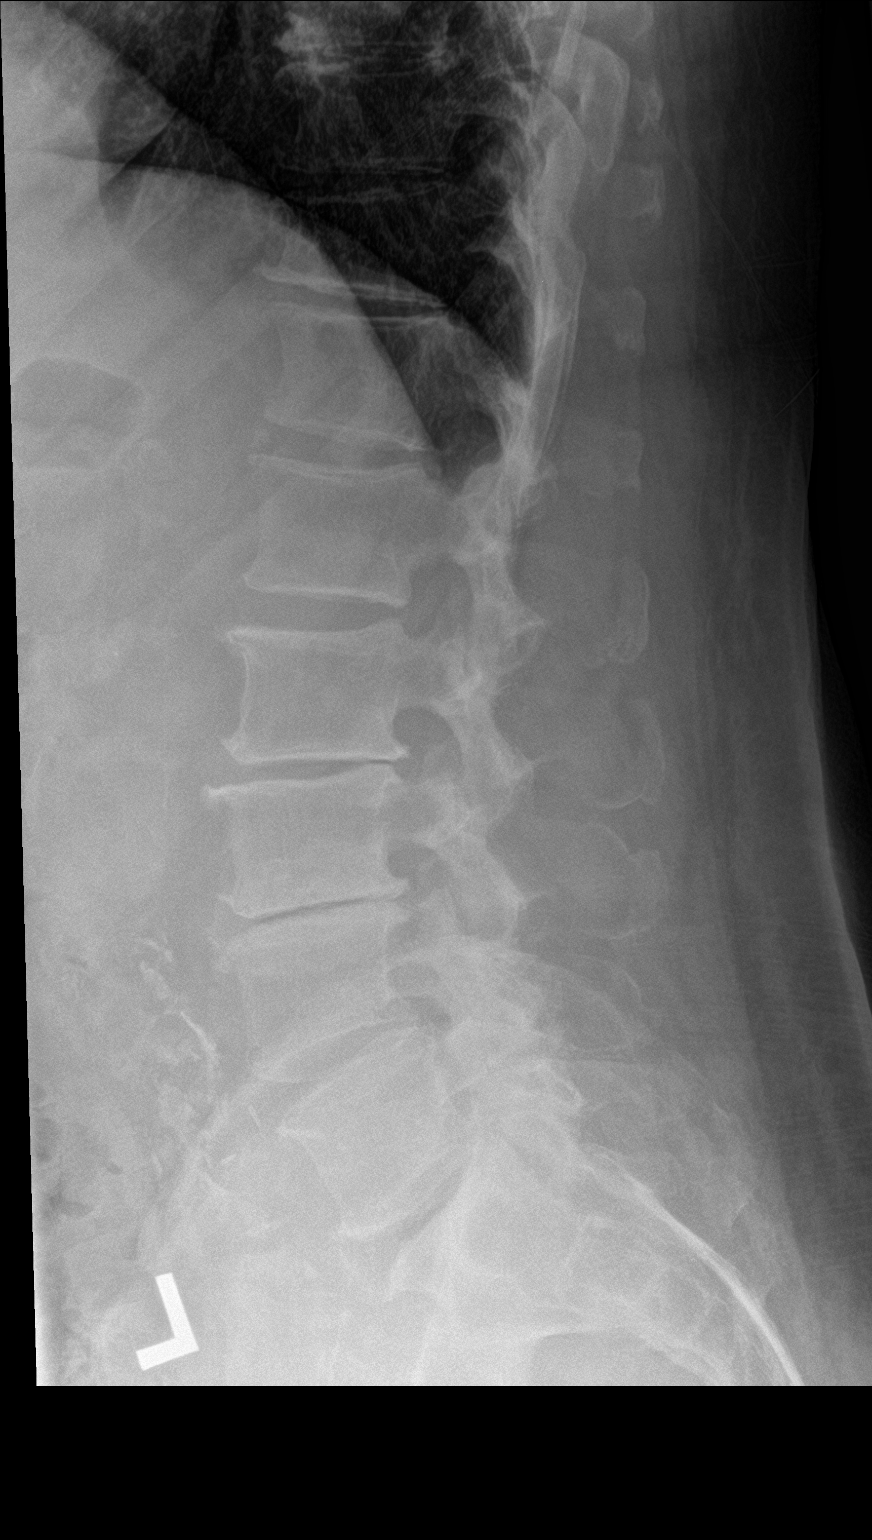

[l-spine spot]
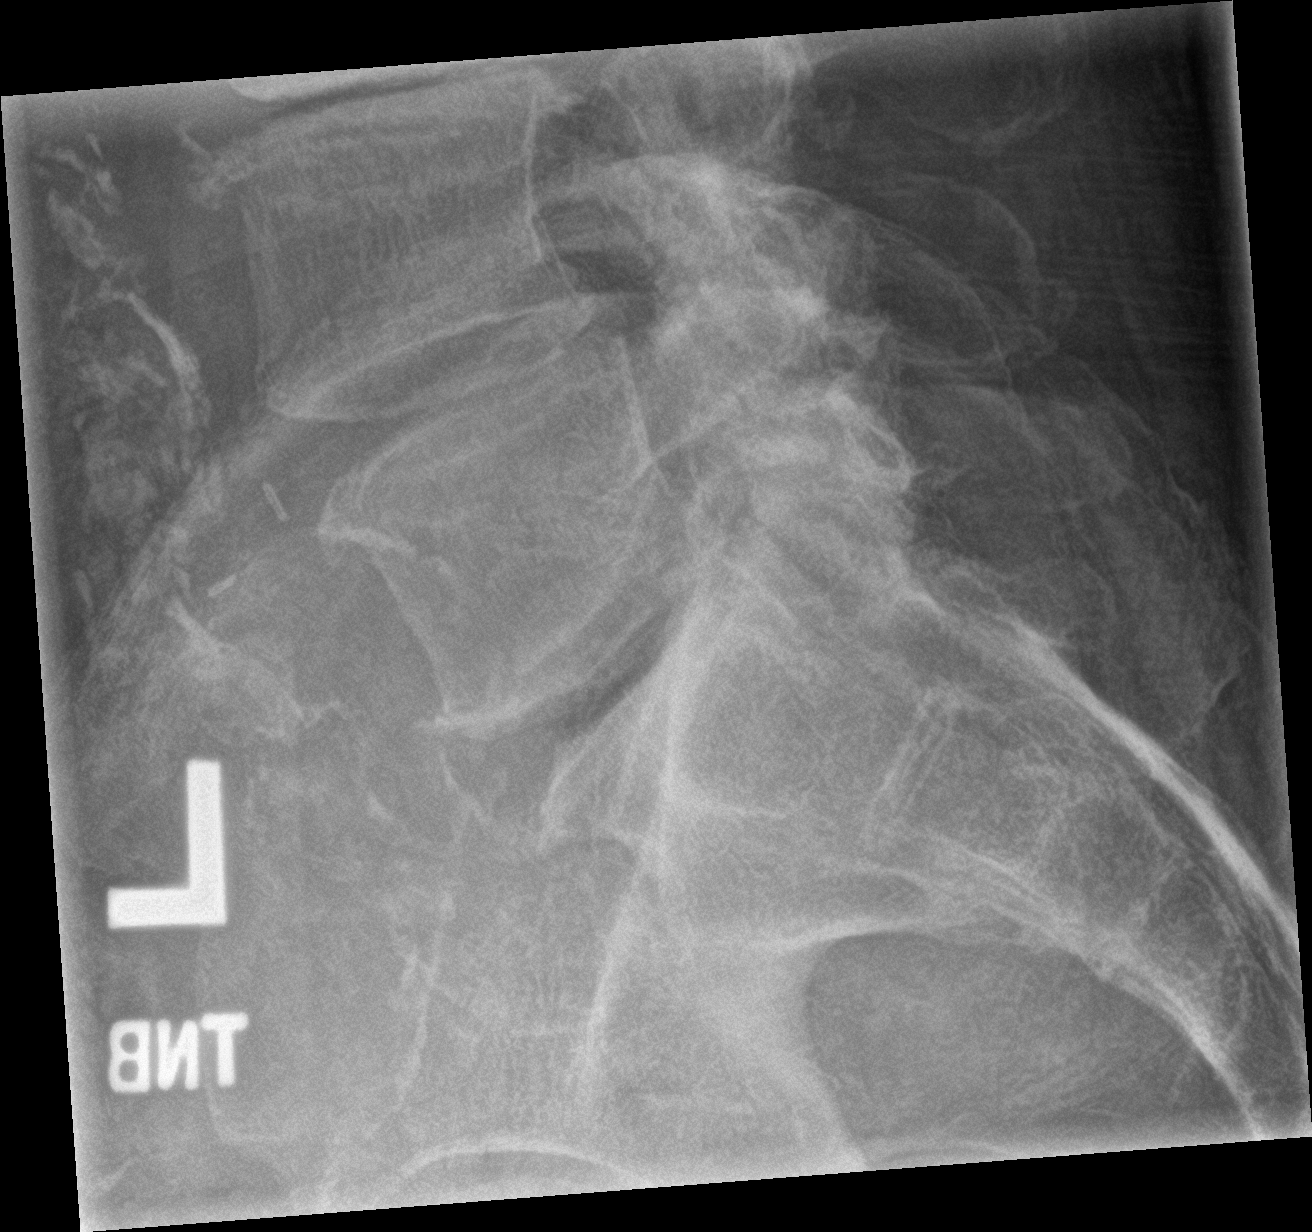

[3 of 3 positions shown; findings below may reference images not displayed]

FINDINGS: Moderate degenerative lumbar spondylosis with multilevel disc
disease and facet disease. No acute lumbar compression fracture or
obvious bone lesion. Mild stable anterior wedging of the T11
vertebral body. No definite pars defects. The visualized bony pelvis
is intact. No bone lesions or fractures. Stable vascular
calcifications.
IMPRESSION: 1. Moderate degenerative lumbar spondylosis with multilevel disc
disease and facet disease.
2. No acute bony findings.

## 2020-10-22 DIAGNOSIS — J449 Chronic obstructive pulmonary disease, unspecified: Secondary | ICD-10-CM | POA: Diagnosis not present

## 2020-10-22 DIAGNOSIS — H8101 Meniere's disease, right ear: Secondary | ICD-10-CM | POA: Diagnosis not present

## 2020-10-22 DIAGNOSIS — M545 Low back pain, unspecified: Secondary | ICD-10-CM | POA: Diagnosis not present

## 2020-10-22 DIAGNOSIS — M4804 Spinal stenosis, thoracic region: Secondary | ICD-10-CM | POA: Diagnosis not present

## 2020-11-04 DIAGNOSIS — L650 Telogen effluvium: Secondary | ICD-10-CM | POA: Diagnosis not present

## 2020-11-25 DIAGNOSIS — M545 Low back pain, unspecified: Secondary | ICD-10-CM | POA: Diagnosis not present

## 2020-11-26 ENCOUNTER — Other Ambulatory Visit: Payer: Self-pay | Admitting: Neurosurgery

## 2020-11-26 ENCOUNTER — Other Ambulatory Visit (HOSPITAL_COMMUNITY): Payer: Self-pay | Admitting: Neurosurgery

## 2020-11-26 DIAGNOSIS — M545 Low back pain, unspecified: Secondary | ICD-10-CM

## 2020-12-24 ENCOUNTER — Ambulatory Visit (HOSPITAL_COMMUNITY)
Admission: RE | Admit: 2020-12-24 | Discharge: 2020-12-24 | Disposition: A | Discharge status: Home / Self Care | Payer: Medicare HMO | Source: Ambulatory Visit | Attending: Neurosurgery | Admitting: Neurosurgery

## 2020-12-24 ENCOUNTER — Other Ambulatory Visit: Payer: Self-pay

## 2020-12-24 DIAGNOSIS — M545 Low back pain, unspecified: Secondary | ICD-10-CM | POA: Diagnosis not present

## 2020-12-28 DIAGNOSIS — M48061 Spinal stenosis, lumbar region without neurogenic claudication: Secondary | ICD-10-CM | POA: Diagnosis not present

## 2020-12-28 DIAGNOSIS — Z6833 Body mass index (BMI) 33.0-33.9, adult: Secondary | ICD-10-CM | POA: Diagnosis not present

## 2020-12-28 DIAGNOSIS — M47816 Spondylosis without myelopathy or radiculopathy, lumbar region: Secondary | ICD-10-CM | POA: Diagnosis not present

## 2020-12-28 DIAGNOSIS — M545 Low back pain, unspecified: Secondary | ICD-10-CM | POA: Diagnosis not present

## 2020-12-28 DIAGNOSIS — I10 Essential (primary) hypertension: Secondary | ICD-10-CM | POA: Diagnosis not present

## 2020-12-28 DIAGNOSIS — M5136 Other intervertebral disc degeneration, lumbar region: Secondary | ICD-10-CM | POA: Diagnosis not present

## 2021-01-19 DIAGNOSIS — M48062 Spinal stenosis, lumbar region with neurogenic claudication: Secondary | ICD-10-CM | POA: Diagnosis not present

## 2021-01-19 DIAGNOSIS — M48061 Spinal stenosis, lumbar region without neurogenic claudication: Secondary | ICD-10-CM | POA: Diagnosis not present

## 2021-03-01 DIAGNOSIS — Z6832 Body mass index (BMI) 32.0-32.9, adult: Secondary | ICD-10-CM | POA: Diagnosis not present

## 2021-03-01 DIAGNOSIS — I10 Essential (primary) hypertension: Secondary | ICD-10-CM | POA: Diagnosis not present

## 2021-03-03 ENCOUNTER — Other Ambulatory Visit: Payer: Self-pay | Admitting: Neurosurgery

## 2021-03-03 ENCOUNTER — Other Ambulatory Visit (HOSPITAL_COMMUNITY): Payer: Self-pay | Admitting: Neurosurgery

## 2021-03-03 DIAGNOSIS — M545 Low back pain, unspecified: Secondary | ICD-10-CM

## 2021-03-12 ENCOUNTER — Ambulatory Visit (HOSPITAL_COMMUNITY)
Admission: RE | Admit: 2021-03-12 | Discharge: 2021-03-12 | Disposition: A | Discharge status: Home / Self Care | Payer: Medicare HMO | Source: Ambulatory Visit | Attending: Neurosurgery | Admitting: Neurosurgery

## 2021-03-12 ENCOUNTER — Other Ambulatory Visit: Payer: Self-pay

## 2021-03-12 DIAGNOSIS — M5126 Other intervertebral disc displacement, lumbar region: Secondary | ICD-10-CM | POA: Diagnosis not present

## 2021-03-12 DIAGNOSIS — M48061 Spinal stenosis, lumbar region without neurogenic claudication: Secondary | ICD-10-CM | POA: Diagnosis not present

## 2021-03-12 DIAGNOSIS — M545 Low back pain, unspecified: Secondary | ICD-10-CM | POA: Diagnosis not present

## 2021-03-15 DIAGNOSIS — M47816 Spondylosis without myelopathy or radiculopathy, lumbar region: Secondary | ICD-10-CM | POA: Diagnosis not present

## 2021-03-15 DIAGNOSIS — Q7649 Other congenital malformations of spine, not associated with scoliosis: Secondary | ICD-10-CM | POA: Diagnosis not present

## 2021-03-15 DIAGNOSIS — M5136 Other intervertebral disc degeneration, lumbar region: Secondary | ICD-10-CM | POA: Diagnosis not present

## 2021-03-15 DIAGNOSIS — M545 Low back pain, unspecified: Secondary | ICD-10-CM | POA: Diagnosis not present

## 2021-03-16 ENCOUNTER — Emergency Department (HOSPITAL_COMMUNITY): Payer: Medicare HMO

## 2021-03-16 ENCOUNTER — Emergency Department (HOSPITAL_COMMUNITY)
Admission: EM | Admit: 2021-03-16 | Discharge: 2021-03-16 | Disposition: A | Discharge status: Home / Self Care | Payer: Medicare HMO | Attending: Emergency Medicine | Admitting: Emergency Medicine

## 2021-03-16 ENCOUNTER — Other Ambulatory Visit: Payer: Self-pay

## 2021-03-16 ENCOUNTER — Encounter (HOSPITAL_COMMUNITY): Payer: Self-pay | Admitting: *Deleted

## 2021-03-16 DIAGNOSIS — Z043 Encounter for examination and observation following other accident: Secondary | ICD-10-CM | POA: Diagnosis not present

## 2021-03-16 DIAGNOSIS — W19XXXA Unspecified fall, initial encounter: Secondary | ICD-10-CM

## 2021-03-16 DIAGNOSIS — S0990XA Unspecified injury of head, initial encounter: Secondary | ICD-10-CM | POA: Diagnosis not present

## 2021-03-16 DIAGNOSIS — I959 Hypotension, unspecified: Secondary | ICD-10-CM | POA: Diagnosis not present

## 2021-03-16 DIAGNOSIS — Y92137 Garden or yard on military base as the place of occurrence of the external cause: Secondary | ICD-10-CM | POA: Insufficient documentation

## 2021-03-16 DIAGNOSIS — R7309 Other abnormal glucose: Secondary | ICD-10-CM | POA: Insufficient documentation

## 2021-03-16 DIAGNOSIS — S0093XA Contusion of unspecified part of head, initial encounter: Secondary | ICD-10-CM | POA: Diagnosis not present

## 2021-03-16 DIAGNOSIS — M47812 Spondylosis without myelopathy or radiculopathy, cervical region: Secondary | ICD-10-CM | POA: Diagnosis not present

## 2021-03-16 DIAGNOSIS — M542 Cervicalgia: Secondary | ICD-10-CM | POA: Diagnosis not present

## 2021-03-16 DIAGNOSIS — R55 Syncope and collapse: Secondary | ICD-10-CM | POA: Diagnosis not present

## 2021-03-16 DIAGNOSIS — W01111A Fall on same level from slipping, tripping and stumbling with subsequent striking against power tool or machine, initial encounter: Secondary | ICD-10-CM | POA: Insufficient documentation

## 2021-03-16 DIAGNOSIS — G4489 Other headache syndrome: Secondary | ICD-10-CM | POA: Diagnosis not present

## 2021-03-16 DIAGNOSIS — I10 Essential (primary) hypertension: Secondary | ICD-10-CM | POA: Diagnosis not present

## 2021-03-16 DIAGNOSIS — R609 Edema, unspecified: Secondary | ICD-10-CM | POA: Diagnosis not present

## 2021-03-16 HISTORY — DX: Spinal stenosis, site unspecified: M48.00

## 2021-03-16 LAB — CBC WITH DIFFERENTIAL/PLATELET
Abs Immature Granulocytes: 0.07 10*3/uL (ref 0.00–0.07)
Basophils Absolute: 0.1 10*3/uL (ref 0.0–0.1)
Basophils Relative: 1 %
Eosinophils Absolute: 0.1 10*3/uL (ref 0.0–0.5)
Eosinophils Relative: 1 %
HCT: 46.9 % (ref 39.0–52.0)
Hemoglobin: 15.8 g/dL (ref 13.0–17.0)
Immature Granulocytes: 1 %
Lymphocytes Relative: 15 %
Lymphs Abs: 2.1 10*3/uL (ref 0.7–4.0)
MCH: 34 pg (ref 26.0–34.0)
MCHC: 33.7 g/dL (ref 30.0–36.0)
MCV: 100.9 fL — ABNORMAL HIGH (ref 80.0–100.0)
Monocytes Absolute: 1.6 10*3/uL — ABNORMAL HIGH (ref 0.1–1.0)
Monocytes Relative: 11 %
Neutro Abs: 10.3 10*3/uL — ABNORMAL HIGH (ref 1.7–7.7)
Neutrophils Relative %: 71 %
Platelets: 292 10*3/uL (ref 150–400)
RBC: 4.65 MIL/uL (ref 4.22–5.81)
RDW: 14.8 % (ref 11.5–15.5)
WBC: 14.2 10*3/uL — ABNORMAL HIGH (ref 4.0–10.5)
nRBC: 0 % (ref 0.0–0.2)

## 2021-03-16 LAB — COMPREHENSIVE METABOLIC PANEL
ALT: 23 U/L (ref 0–44)
AST: 22 U/L (ref 15–41)
Albumin: 4 g/dL (ref 3.5–5.0)
Alkaline Phosphatase: 59 U/L (ref 38–126)
Anion gap: 13 (ref 5–15)
BUN: 21 mg/dL (ref 8–23)
CO2: 23 mmol/L (ref 22–32)
Calcium: 9.3 mg/dL (ref 8.9–10.3)
Chloride: 102 mmol/L (ref 98–111)
Creatinine, Ser: 1.24 mg/dL (ref 0.61–1.24)
GFR, Estimated: >60 mL/min (ref >60)
Glucose, Bld: 123 mg/dL — ABNORMAL HIGH (ref 70–99)
Potassium: 3.9 mmol/L (ref 3.5–5.1)
Sodium: 138 mmol/L (ref 135–145)
Total Bilirubin: 0.3 mg/dL (ref 0.3–1.2)
Total Protein: 7.3 g/dL (ref 6.5–8.1)

## 2021-03-16 LAB — CBG MONITORING, ED: Glucose-Capillary: 115 mg/dL — ABNORMAL HIGH (ref 70–99)

## 2021-03-16 MED ORDER — HYDROCODONE-ACETAMINOPHEN 5-325 MG PO TABS: 1.0000 | ORAL_TABLET | Freq: Three times a day (TID) | ORAL | 0 refills | Status: DC | PRN

## 2021-03-16 MED ORDER — TRAMADOL HCL 50 MG PO TABS
50.0000 mg | ORAL_TABLET | Freq: Once | ORAL | Status: AC
  Administered 2021-03-16: 17:00:00 50 mg via ORAL
  Filled 2021-03-16: qty 1

## 2021-03-16 MED ORDER — SODIUM CHLORIDE 0.9 % IV SOLN: INTRAVENOUS | Status: DC

## 2021-03-16 NOTE — ED Provider Notes (Signed)
[facility] Provider Note   CSN: [id number] Arrival date & time: 03/16/21  1500     History  Chief Complaint  Patient presents with   Gerrard Crystal Aldrete is a 71 y.o. male.  HPI Patient presents after fall with syncope.  He is here with his wife who assists with history, as there is EMS. Patient fell, striking his head against the bucket on his tractor.  There was loss of consciousness.  Since that time no confusion, disorientation, vision changes, weakness.  He does have mild pain in the posterior.  Loss of consciousness was for a few moments. He was in his usual state of health prior to the fall, no recent medication change, activity change, no blood thinner usage.    Home Medications Prior to Admission medications   Medication Sig Start Date End Date Taking? Authorizing Provider  albuterol (PROVENTIL HFA;VENTOLIN HFA) 108 (90 Base) MCG/ACT inhaler Inhale 1-2 puffs into the lungs every 6 (six) hours as needed for wheezing or shortness of breath.    [provider]  amLODipine (NORVASC) 10 MG tablet Take 10 mg by mouth every evening.  10/21/17   [provider]  BREO ELLIPTA 200-25 MCG/INH AEPB Inhale 1 puff into the lungs every evening.  11/29/16   [provider]  diazepam (VALIUM) 5 MG tablet Take 5 mg by mouth at bedtime.     [provider]  diphenhydrAMINE (BENADRYL) 25 MG tablet Take 25 mg by mouth at bedtime. With Diazepam Patient not taking: Reported on 08/21/2019    [provider]  diphenhydrAMINE (BENADRYL) 50 MG capsule Take 50 mg by mouth 1 hour prior to your procedure. Patient not taking: Reported on 05/14/2019 11/13/18   Rosetta Posner, MD  ezetimibe (ZETIA) 10 MG tablet  03/04/19   [provider]  lisinopril (PRINIVIL,ZESTRIL) 40 MG tablet Take 40 mg by mouth every evening.  10/21/17   [provider]  pantoprazole (PROTONIX) 40 MG tablet Take 1 tablet (40 mg total) by mouth 2 (two)  times daily before a meal. Patient not taking: Reported on 05/14/2019 01/15/17   Tat, Shanon Brow, MD  polyethylene glycol-electrolytes (TRILYTE) 420 g solution Take 4,000 mLs by mouth as directed. 07/14/20   [name], Mechele Dawley, MD  predniSONE (DELTASONE) 50 MG tablet One tablet (50mg ) 13 hours prior to procedure; one tablet (50mg ) 7 hours prior to procedure and then one tablet (50 mg) one hour prior to procedure. 11/13/18   Rosetta Posner, MD  traMADol (ULTRAM) 50 MG tablet Take 50 mg by mouth 3 (three) times daily.     [provider]      Allergies    Contrast media [iodinated contrast media], Iohexol, and Prednisone    Review of Systems   Review of Systems  Constitutional:        Per HPI, otherwise negative  HENT:         Per HPI, otherwise negative  Respiratory:         Per HPI, otherwise negative  Cardiovascular:        Per HPI, otherwise negative  Gastrointestinal:  Negative for vomiting.  Endocrine:       Negative aside from HPI  Genitourinary:        Neg aside from HPI   Musculoskeletal:        Per HPI, otherwise negative  Skin: Negative.   Neurological:  Negative for syncope.   Physical Exam Updated Vital Signs BP  133/78 (BP Location: Left Arm)    Pulse 73    Temp 98.2 F (36.8 C) (Oral)    Resp 19    Ht 5\' 10"  (1.778 m)    Wt 97.1 kg    SpO2 97%    BMI 30.71 kg/m  Physical Exam Vitals and nursing note reviewed.  Constitutional:      General: He is not in acute distress.    Appearance: He is well-developed.  HENT:     Head: Normocephalic.   Eyes:     Conjunctiva/sclera: Conjunctivae normal.  Neck:     Comments: Cervical collar in place no obvious deformity Cardiovascular:     Rate and Rhythm: Normal rate and regular rhythm.  Pulmonary:     Effort: Pulmonary effort is normal. No respiratory distress.     Breath sounds: No stridor.  Abdominal:     General: There is no distension.  Skin:    General: Skin is warm and dry.  Neurological:     Mental Status:  He is alert and oriented to person, place, and time.     Motor: No weakness, tremor, atrophy or abnormal muscle tone.     Coordination: Coordination normal.    ED Results / Procedures / Treatments   Labs (all labs ordered are listed, but only abnormal results are displayed) Labs Reviewed  COMPREHENSIVE METABOLIC PANEL  CBC WITH DIFFERENTIAL/PLATELET  CBG MONITORING, ED    EKG None  Radiology No results found.  Procedures Procedures    Medications Ordered in ED Medications  0.9 %  sodium chloride infusion (has no administration in time range)    ED Course/ Medical Decision Making/ A&P Adult male presents after fall with pain throughout his head, neck.  Considerations include syncope versus mechanical fall, and posttraumatic effects including intracranial hemorrhage, fracture.  Patient placed on continuous monitoring, orders placed.  Cardiac 70 with ectopy abnormal Pulse ox 100% room air normal   5:33 PM Cervical collar removed, C-spine cleared.  Patient amenable to discharge, requests narcotics on discharge.  Discussed utilizing these judiciously only.                         Medical Decision Making Adult male presents after fall.  Findings more consistent with mechanical rather than a syncopal episode.  CT interpreted by me, no intracranial hemorrhage, no skull or C-spine fracture.  Labs reassuring, vital signs reassuring, patient is neurologically intact, awake, alert, appropriate for discharge.  Amount and/or Complexity of Data Reviewed Independent Historian: spouse External Data Reviewed: notes.    Details: Evaluation earlier this month for degenerative spine condition. Labs: ordered. Decision-making details documented in ED Course. Radiology: ordered. Decision-making details documented in ED Course. ECG/medicine tests: ordered.    Details: ECG without arrhythmia, but with ectopy, PVC/bigeminy  Risk Prescription drug management. Decision regarding  hospitalization.  Critical Care Total time providing critical care: < 30 minutes        Final Clinical Impression(s) / ED Diagnoses Final diagnoses:  Fall, initial encounter  Injury of head, initial encounter     Carmin Muskrat, MD 03/16/21 1736

## 2021-03-16 NOTE — ED Triage Notes (Signed)
Pt brought in by [facility] from home with c/o fall. He was working in the yard today and fell over hitting his head on the bucket of a tractor. Reports +LOC for unknown amount of time. Pt c/o feeling nauseated.

## 2021-03-16 NOTE — ED Notes (Signed)
ED Provider at bedside. 

## 2021-04-05 DIAGNOSIS — J441 Chronic obstructive pulmonary disease with (acute) exacerbation: Secondary | ICD-10-CM | POA: Diagnosis not present

## 2021-05-19 ENCOUNTER — Ambulatory Visit (HOSPITAL_COMMUNITY): Payer: Medicare HMO | Attending: Neurosurgery

## 2021-05-19 DIAGNOSIS — M545 Low back pain, unspecified: Secondary | ICD-10-CM | POA: Diagnosis not present

## 2021-05-19 DIAGNOSIS — G8929 Other chronic pain: Secondary | ICD-10-CM | POA: Diagnosis not present

## 2021-05-19 DIAGNOSIS — Q7649 Other congenital malformations of spine, not associated with scoliosis: Secondary | ICD-10-CM | POA: Diagnosis not present

## 2021-05-19 NOTE — Therapy (Signed)
OUTPATIENT PHYSICAL THERAPY THORACOLUMBAR EVALUATION   Patient Name: Jeremy Mclean MRN: [id number] DOB:09-Feb-1951, 71 y.o., male Today's Date: 05/19/2021   PT End of Session - 05/19/21 1423     Visit Number 1    Number of Visits 8    Date for PT Re-Evaluation 06/18/21    Authorization Type [employer]    Authorization Time Period 40/98/11; no cert required; pre-auth    Authorization - Visit Number 1    Progress Note Due on Visit 8    PT Start Time 1420    PT Stop Time 1520    PT Time Calculation (min) 60 min             Past Medical History:  Diagnosis Date   AAA (abdominal aortic aneurysm)    Anxiety    Arthritis    COPD (chronic obstructive pulmonary disease) (HCC)    Degenerative disorder of bone    Depression    Diverticulitis    Dyspnea    due to COPD and when walking long distances per patient   GERD (gastroesophageal reflux disease)    History of kidney stones    Hypertension    Kidney stone    Spinal stenosis    Past Surgical History:  Procedure Laterality Date   ABDOMINAL AORTIC ANEURYSM REPAIR N/A 01/16/2019   Procedure: OPEN REPAIR OF ABDOMINAL AORTIC ANEURYSM;  Surgeon: Larina Earthly, MD;  Location: [facility] OR;  Service: Vascular;  Laterality: N/A;   BIOPSY  01/14/2017   Procedure: BIOPSY;  Surgeon: West Bali, MD;  [facility];  Service: Endoscopy;;  gastric    CERVICAL SPINE SURGERY     C6-7 ACDF 09/22/09   COLONOSCOPY N/A 04/09/2014   Procedure: COLONOSCOPY;  Surgeon: Malissa Hippo, MD;  Location: [facility];  Service: Endoscopy;  Laterality: N/A;  730 - moved to 7:30 - Ann to notify pt   CYSTOSCOPY     with stent   ESOPHAGOGASTRODUODENOSCOPY N/A 01/14/2017   Procedure: ESOPHAGOGASTRODUODENOSCOPY (EGD);  Surgeon: West Bali, MD;  [facility];  Service: Endoscopy;  Laterality: N/A;   kidney stent     Patient Active Problem List   Diagnosis Date Noted   Low back pain 08/21/2019   AAA (abdominal aortic  aneurysm) 01/16/2019   UGIB (upper gastrointestinal bleed)    Acute blood loss anemia 01/13/2017   Melena 01/13/2017   Precordial chest pain 01/13/2017   Iliac artery aneurysm (HCC) 07/11/2014   Tobacco abuse 07/11/2014   History of colonic polyps 03/18/2014   Abdominal aortic aneurysm 03/18/2014   Essential hypertension 03/18/2014    PCP: Carylon Perches, MD  REFERRING PROVIDER: Council Mechanic, NP  REFERRING DIAG: 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  THERAPY DIAG:  Chronic low back pain, unspecified back pain laterality, unspecified whether sciatica present  Bertolotti's syndrome  ONSET DATE: October 2021  SUBJECTIVE:     Patient does not recall any specific injury to his back; pain "just started" and so he went to see Dr. Venetia Maxon and they tried some injections about 4 months ago; a series of three; with no improvement. Then referred to physical therapy.  SUBJECTIVE STATEMENT: Pain in the low back sometimes runs down his legs. 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 PERTINENT HISTORY:  Old residual Left side weakness due to nerve compression neck per patient report His wife recently fell and broke her leg; she is currently in [facility]  PAIN:  Are you having pain? Yes: NPRS scale: 2/10 Pain location: low back and occasionally down his legs Pain description: constant pain but intensity varies Aggravating factors: standing , walking Relieving factors: sitting   PRECAUTIONS: Fall  WEIGHT BEARING RESTRICTIONS No  FALLS:  Has patient fallen in last 6 months? Yes. Number of falls 4  LIVING ENVIRONMENT: Lives with: lives with their spouse Lives in: House/apartment Stairs: Yes: External: 3 steps; bilateral but  cannot reach both Has following equipment at home: Single point cane, Walker - 4 wheeled, Crutches, shower chair, bed side commode, Grab bars, and Ramped entry  OCCUPATION: farming  PLOF: Independent  PATIENT GOALS want be to able to walk farther   OBJECTIVE:   DIAGNOSTIC FINDINGS:  X-rays: 2 bone spurs and L5 disc "out of place"  PATIENT SURVEYS:  FOTO 39  SCREENING FOR RED FLAGS: Bowel or bladder incontinence: No Spinal tumors: No Cauda equina syndrome: No Compression fracture: No Abdominal aneurysm: No  COGNITION:  Overall cognitive status: Within functional limits for tasks assessed     SENSATION: Appears wfl  POSTURE:  Slumped sitting, decreased lumbar curve; forward head and rounded shoulders  PALPATION: Tender L4-L5 paraspinals; top of sacral area  LUMBAR ROM:   Active  A/PROM  05/19/2021  Flexion 85% available  Extension 30% available  Right lateral flexion 50% available  Left lateral flexion 50% available  Right rotation 30% available  Left rotation 30% available   (Blank rows = not tested)   LE MMT:  MMT Right 05/19/2021 Left 05/19/2021  Hip flexion 4+/5 4/5  Hip extension    Hip abduction    Hip adduction    Hip internal rotation    Hip external rotation    Knee flexion 4+/5 3-/5  Knee extension 4+/5 4-/5  Ankle dorsiflexion 4+/5 4+/5  Ankle plantarflexion    Ankle inversion    Ankle eversion     (Blank rows = not tested)   FUNCTIONAL TESTS:  5 times sit to stand: 23 sec  GAIT: Distance walked: 65 Assistive device utilized: None Level of assistance: SBA Comments: antalgic gait, decreased stance Left lower extremity    TODAY'S TREATMENT  Physical therapy evaluation, HEP instruction   PATIENT EDUCATION:  Education details: good sitting posture, HEP and plan of care Person educated: Patient Education method: Explanation, Demonstration, Tactile cues, Verbal cues, and Handouts Education comprehension: verbalized  understanding, returned demonstration, verbal cues required, tactile cues required, and needs further education   HOME EXERCISE PROGRAM: Access Code: [email or web address] URL: [email or web address] Date: 05/19/2021 Prepared by: [facility] - [facility]  Exercises - Supine Lower Trunk Rotation  - 2 x daily - 7 x weekly - 1 sets - 10 reps - 2 -4 sec hold hold - Supine Transversus Abdominis Bracing - Hands on Stomach  - 2 x daily - 7 x weekly - 1 sets - 10 reps - Hooklying Single Knee to Chest Stretch  - 2 x daily - 7 x weekly - 1 sets - 10 reps - 10 sec hold  ASSESSMENT:  CLINICAL IMPRESSION: Patient is a 71 y.o. male who was seen today for physical therapy evaluation and treatment  for Bertolotti's syndrome and back pain.     OBJECTIVE IMPAIRMENTS Abnormal gait, decreased activity tolerance, decreased balance, decreased coordination, decreased endurance, decreased knowledge of condition, decreased knowledge of use of DME, decreased mobility, difficulty walking, decreased ROM, decreased strength, decreased safety awareness, hypomobility, impaired perceived functional ability, increased muscle spasms, impaired flexibility, improper body mechanics, postural dysfunction, and pain.   ACTIVITY LIMITATIONS cleaning, community activity, driving, meal prep, occupation, laundry, yard work, shopping, and yard work.   PERSONAL FACTORS Age and Fitness are also affecting patient's functional outcome.    REHAB POTENTIAL: Good  CLINICAL DECISION MAKING: Stable/uncomplicated  EVALUATION COMPLEXITY: Low   GOALS: Goals reviewed with patient? Yes  SHORT TERM GOALS: Target date: 06/02/2021  Patient with be independent with initial HEP to  improve functional outcomes  Goal status: INITIAL  2.  Patient will ambulate x 100 ft without pain stopping him during walk without AD to improve ability to navigate household and home distances. Baseline: 65 ft Goal status: INITIAL   LONG TERM GOALS: Target date:  06/16/2021  Patient will be independent in self management strategies to improve quality of life and functional outcomes.  Goal status: INITIAL  2.   Patient will meet predicted FOTO score to demonstrate improved overall function Baseline: 39 Goal status: INITIAL  3.  Patient will report at least 50% improvement in overall symptoms and/or function to demonstrate improved functional mobility   Goal status: INITIAL  4.  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   Baseline: 65 Goal status: INITIAL    PLAN: PT FREQUENCY: 2x/week  PT DURATION: 4 weeks  PLANNED INTERVENTIONS: Therapeutic exercises, Therapeutic activity, Neuromuscular re-education, Balance training, Gait training, Patient/Family education, Joint manipulation, Joint mobilization, Stair training, Vestibular training, Canalith repositioning, DME instructions, Aquatic Therapy, Dry Needling, Electrical stimulation, Spinal manipulation, Spinal mobilization, Cryotherapy, Moist heat, Compression bandaging, Taping, Traction, Ultrasound, Biofeedback, and Manual therapy.  PLAN FOR NEXT SESSION: Review HEP and goals, posture; progress lumbar stabilization and mobility as able. Try manual mobs L4-5   3:38 PM, 05/19/21 Makell Cyr Small Mynor Witkop MPT [facility] [place] [phone]

## 2021-05-19 NOTE — Addendum Note (Signed)
Addended byKarn Cassis S on: 05/19/2021 03:42 PM ? ? Modules accepted: Orders ? ?

## 2021-05-26 ENCOUNTER — Ambulatory Visit (HOSPITAL_COMMUNITY): Payer: Medicare HMO | Attending: Neurosurgery

## 2021-05-26 DIAGNOSIS — G8929 Other chronic pain: Secondary | ICD-10-CM | POA: Diagnosis not present

## 2021-05-26 DIAGNOSIS — M545 Low back pain, unspecified: Secondary | ICD-10-CM | POA: Diagnosis not present

## 2021-05-26 DIAGNOSIS — Q7649 Other congenital malformations of spine, not associated with scoliosis: Secondary | ICD-10-CM | POA: Insufficient documentation

## 2021-05-26 NOTE — Therapy (Signed)
OUTPATIENT PHYSICAL THERAPY TREATMENT NOTE   Patient Name: Jeremy Mclean MRN: [id number] DOB:20-Nov-1950, 71 y.o., male Today's Date: 05/26/2021  PCP: Carylon Perches, MD REFERRING PROVIDER: Carylon Perches, MD  END OF SESSION:   PT End of Session - 05/26/21 1652     Visit Number 2    Number of Visits 8    Date for PT Re-Evaluation 06/18/21    Authorization Type [employer]    Authorization Time Period 08/65/78; no cert required; pre-auth    Authorization - Visit Number 1    Progress Note Due on Visit 8    PT Start Time 1647    PT Stop Time 1730    PT Time Calculation (min) 43 min             Past Medical History:  Diagnosis Date   AAA (abdominal aortic aneurysm)    Anxiety    Arthritis    COPD (chronic obstructive pulmonary disease) (HCC)    Degenerative disorder of bone    Depression    Diverticulitis    Dyspnea    due to COPD and when walking long distances per patient   GERD (gastroesophageal reflux disease)    History of kidney stones    Hypertension    Kidney stone    Spinal stenosis    Past Surgical History:  Procedure Laterality Date   ABDOMINAL AORTIC ANEURYSM REPAIR N/A 01/16/2019   Procedure: OPEN REPAIR OF ABDOMINAL AORTIC ANEURYSM;  Surgeon: Larina Earthly, MD;  Location: [facility] OR;  Service: Vascular;  Laterality: N/A;   BIOPSY  01/14/2017   Procedure: BIOPSY;  Surgeon: West Bali, MD;  [facility];  Service: Endoscopy;;  gastric    CERVICAL SPINE SURGERY     C6-7 ACDF 09/22/09   COLONOSCOPY N/A 04/09/2014   Procedure: COLONOSCOPY;  Surgeon: Malissa Hippo, MD;  [facility];  Service: Endoscopy;  Laterality: N/A;  730 - moved to 7:30 - Ann to notify pt   CYSTOSCOPY     with stent   ESOPHAGOGASTRODUODENOSCOPY N/A 01/14/2017   Procedure: ESOPHAGOGASTRODUODENOSCOPY (EGD);  Surgeon: West Bali, MD;  [facility];  Service: Endoscopy;  Laterality: N/A;   kidney stent     Patient Active Problem List   Diagnosis  Date Noted   Low back pain 08/21/2019   AAA (abdominal aortic aneurysm) (HCC) 01/16/2019   UGIB (upper gastrointestinal bleed)    Acute blood loss anemia 01/13/2017   Melena 01/13/2017   Precordial chest pain 01/13/2017   Iliac artery aneurysm (HCC) 07/11/2014   Tobacco abuse 07/11/2014   History of colonic polyps 03/18/2014   Abdominal aortic aneurysm (HCC) 03/18/2014   Essential hypertension 03/18/2014    REFERRING DIAG: low back pain, Bertolotti's Syndrome-  THERAPY DIAG:  Chronic low back pain, unspecified back pain laterality, unspecified whether sciatica present  Bertolotti's syndrome   PRECAUTIONS: fall  SUBJECTIVE: Patient reports he is trying to do his exercises.  He thinks he walked a little better after last Rx. But today was only able to walk about 50 ft before needing to sit down. He is working on his farm and so while he is active he is limited by his pain.   PAIN:  Are you having pain? Yes: NPRS scale: 3/10 Pain location: low back Pain description: aching Aggravating factors: standing, walking Relieving factors: sit, rest      OBJECTIVE:      PALPATION: Tender L4-L5 paraspinals; top of sacral area  LUMBAR ROM:    Active  A/PROM  05/19/2021  Flexion 85% available  Extension 30% available  Right lateral flexion 50% available  Left lateral flexion 50% available  Right rotation 30% available  Left rotation 30% available   (Blank rows = not tested)     LE MMT:   MMT Right 05/19/2021 Left 05/19/2021  Hip flexion 4+/5 4/5  Hip extension      Hip abduction      Hip adduction      Hip internal rotation      Hip external rotation      Knee flexion 4+/5 3-/5  Knee extension 4+/5 4-/5  Ankle dorsiflexion 4+/5 4+/5  Ankle plantarflexion      Ankle inversion      Ankle eversion       (Blank rows = not tested)     FUNCTIONAL TESTS:  5 times sit to stand: 23 sec   GAIT: Distance walked: 65 Assistive device utilized: None Level of  assistance: SBA Comments: antalgic gait, decreased stance Left lower extremity       TODAY'S TREATMENT   05/26/21 LTR x 10 ABD bracing x 5" x 10 Hooklying Single Knee to Chest Stretch x 10" hold x 10 Hooklying hip ADD with ball x 10 Hooklying hip ABD with belt x 10 Log roll for supine to sit; needs mod A        PATIENT EDUCATION:  Education details: good sitting posture, HEP and plan of care Person educated: Patient Education method: Explanation, Demonstration, Tactile cues, Verbal cues, and Handouts Education comprehension: verbalized understanding, returned demonstration, verbal cues required, tactile cues required, and needs further education     HOME EXERCISE PROGRAM: Access Code: [email or web address] URL: [email or web address] Date: 05/26/2021 Prepared by: [facility] - [facility]  Exercises - Hooklying Isometric Clamshell  - 1 x daily - 7 x weekly - 1 sets - 10 reps - Supine Hip Adduction Isometric with Ball  - 1 x daily - 7 x weekly - 1 sets - 10 reps    Access Code: [email or web address] URL: [email or web address] Date: 05/19/2021 Prepared by: [facility] - [facility]   Exercises - Supine Lower Trunk Rotation  - 2 x daily - 7 x weekly - 1 sets - 10 reps - 2 -4 sec hold hold - Supine Transversus Abdominis Bracing - Hands on Stomach  - 2 x daily - 7 x weekly - 1 sets - 10 reps - Hooklying Single Knee to Chest Stretch  - 2 x daily - 7 x weekly - 1 sets - 10 reps - 10 sec hold   ASSESSMENT:   CLINICAL IMPRESSION: Today's session focused on review of goals and HEP.  Progressed lumbar stabilization exercises. Pain limits some of his exercises today.  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  Patient will continue to benefit from skilled therapy services to reduce deficits and improve functional level.    OBJECTIVE IMPAIRMENTS Abnormal gait, decreased activity tolerance, decreased balance, decreased coordination, decreased  endurance, decreased knowledge of condition, decreased knowledge of use of DME, decreased mobility, difficulty walking, decreased ROM, decreased strength, decreased safety awareness, hypomobility, impaired perceived functional ability, increased muscle spasms, impaired flexibility, improper body mechanics, postural dysfunction, and pain.    ACTIVITY LIMITATIONS cleaning, community activity, driving, meal prep, occupation, laundry, yard work, shopping, and yard work.    PERSONAL FACTORS Age and Fitness are also affecting patient's functional outcome.  REHAB POTENTIAL: Good   CLINICAL DECISION MAKING: Stable/uncomplicated   EVALUATION COMPLEXITY: Low     GOALS: Goals reviewed with patient? Yes   SHORT TERM GOALS: Target date: 06/02/2021   Patient with be independent with initial HEP to  improve functional outcomes   Goal status: ongoing   2.  Patient will ambulate x 100 ft without pain stopping him during walk without AD to improve ability to navigate household and home distances. Baseline: 65 ft Goal status: ongoing    LONG TERM GOALS: Target date: 06/16/2021   Patient will be independent in self management strategies to improve quality of life and functional outcomes.  Goal status: INITIAL   2.   Patient will meet predicted FOTO score to demonstrate improved overall function Baseline: 39 Goal status: INITIAL   3.  Patient will report at least 50% improvement in overall symptoms and/or function to demonstrate improved functional mobility     Goal status: INITIAL   4.  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   Baseline: 65 Goal status: INITIAL       PLAN: PT FREQUENCY: 2x/week   PT DURATION: 4 weeks   PLANNED INTERVENTIONS: Therapeutic exercises, Therapeutic activity, Neuromuscular re-education, Balance training, Gait training, Patient/Family education, Joint  manipulation, Joint mobilization, Stair training, Vestibular training, Canalith repositioning, DME instructions, Aquatic Therapy, Dry Needling, Electrical stimulation, Spinal manipulation, Spinal mobilization, Cryotherapy, Moist heat, Compression bandaging, Taping, Traction, Ultrasound, Biofeedback, and Manual therapy.   PLAN FOR NEXT SESSION:  progress lumbar stabilization and mobility as able. Try manual mobs L4-5? Bed mobility      5:27 PM, 05/26/21 Dahlton Hinde Small Burnetta Kohls MPT [facility] [place] [phone]

## 2021-06-02 ENCOUNTER — Ambulatory Visit (HOSPITAL_COMMUNITY): Payer: Medicare HMO

## 2021-06-02 ENCOUNTER — Encounter (HOSPITAL_COMMUNITY): Payer: Self-pay

## 2021-06-02 DIAGNOSIS — M545 Low back pain, unspecified: Secondary | ICD-10-CM

## 2021-06-02 DIAGNOSIS — Q7649 Other congenital malformations of spine, not associated with scoliosis: Secondary | ICD-10-CM

## 2021-06-02 DIAGNOSIS — G8929 Other chronic pain: Secondary | ICD-10-CM | POA: Diagnosis not present

## 2021-06-02 NOTE — Therapy (Signed)
?OUTPATIENT PHYSICAL THERAPY TREATMENT NOTE ? ? ?Patient Name: Jeremy Mclean ?MRN: [id number] ?DOB:May 23, 1950, 71 y.o., male ?Today's Date: 06/02/2021 ? ?PCP: Asencion Noble, MD ?REFERRING PROVIDER: Marvis Moeller, NP ? ?END OF SESSION:  ? PT End of Session - 06/02/21 1447   ? ? Visit Number 3   ? Number of Visits 8   ? Date for PT Re-Evaluation 06/18/21   ? Authorization Type [employer]; 8 visits approved 3/29-->06/19/21   ? Authorization Time Period 53/29/92; no cert required; pre-auth   ? Authorization - Visit Number 2   ? Progress Note Due on Visit 8   ? PT Start Time 1448   ? PT Stop Time 1528   ? PT Time Calculation (min) 40 min   ? Activity Tolerance Patient tolerated treatment well;Patient limited by pain;No increased pain   ? Behavior During Therapy [facility] for tasks assessed/performed   ? ?  ?  ? ?  ? ? ?Past Medical History:  ?Diagnosis Date  ? AAA (abdominal aortic aneurysm) ([place])   ? Anxiety   ? Arthritis   ? COPD (chronic obstructive pulmonary disease) ([place])   ? Degenerative disorder of bone   ? Depression   ? Diverticulitis   ? Dyspnea   ? due to COPD and when walking long distances per patient  ? GERD (gastroesophageal reflux disease)   ? History of kidney stones   ? Hypertension   ? Kidney stone   ? Spinal stenosis   ? ?Past Surgical History:  ?Procedure Laterality Date  ? ABDOMINAL AORTIC ANEURYSM REPAIR N/A 01/16/2019  ? Procedure: OPEN REPAIR OF ABDOMINAL AORTIC ANEURYSM;  Surgeon: Rosetta Posner, MD;  Location: [facility] OR;  Service: Vascular;  Laterality: N/A;  ? BIOPSY  01/14/2017  ? Procedure: BIOPSY;  Surgeon: Danie Binder, MD;  [facility];  Service: Endoscopy;;  gastric ?  ? CERVICAL SPINE SURGERY    ? C6-7 ACDF 09/22/09  ? COLONOSCOPY N/A 04/09/2014  ? Procedure: COLONOSCOPY;  Surgeon: Rogene Houston, MD;  [facility]: [facility];  Service: Endoscopy;  Laterality: N/A;  730 - moved to 7:30 - Ann to notify pt  ? CYSTOSCOPY    ? with stent  ? ESOPHAGOGASTRODUODENOSCOPY N/A 01/14/2017   ? Procedure: ESOPHAGOGASTRODUODENOSCOPY (EGD);  Surgeon: Danie Binder, MD;  [facility];  Service: Endoscopy;  Laterality: N/A;  ? kidney stent    ? ?Patient Active Problem List  ? Diagnosis Date Noted  ? Low back pain 08/21/2019  ? AAA (abdominal aortic aneurysm) ([place]) 01/16/2019  ? UGIB (upper gastrointestinal bleed)   ? Acute blood loss anemia 01/13/2017  ? Melena 01/13/2017  ? Precordial chest pain 01/13/2017  ? Iliac artery aneurysm ([place]) 07/11/2014  ? Tobacco abuse 07/11/2014  ? History of colonic polyps 03/18/2014  ? Abdominal aortic aneurysm ([place]) 03/18/2014  ? Essential hypertension 03/18/2014  ? ? ?REFERRING DIAG: low back pain, Bertolotti's Syndrome- ? ?THERAPY DIAG:  ?Chronic low back pain, unspecified back pain laterality, unspecified whether sciatica present ? ?Bertolotti's syndrome ? ? ?PRECAUTIONS: fall ? ?SUBJECTIVE: Pt stated he is feeling rough for the last 2 days, has taken pain medication prior session today.  Feels he may have lifted with wrong mechanics and increased pain. ? ?PAIN:  ?Are you having pain? Yes: NPRS scale: 8/10 ?Pain location: low back ?Pain description: constant aching  ?Aggravating factors: standing, walking ?Relieving factors: sit, rest ? ? ? ? ? ?OBJECTIVE:  ?  ?  ?PALPATION: ?  Tender L4-L5 paraspinals; top of sacral area ?  ?LUMBAR ROM:  ?  ?Active  A/PROM  ?05/19/2021  ?Flexion 85% available  ?Extension 30% available  ?Right lateral flexion 50% available  ?Left lateral flexion 50% available  ?Right rotation 30% available  ?Left rotation 30% available  ? (Blank rows = not tested) ?  ?  ?LE MMT: ?  ?MMT Right ?05/19/2021 Left ?05/19/2021  ?Hip flexion 4+/5 4/5  ?Hip extension      ?Hip abduction      ?Hip adduction      ?Hip internal rotation      ?Hip external rotation      ?Knee flexion 4+/5 3-/5  ?Knee extension 4+/5 4-/5  ?Ankle dorsiflexion 4+/5 4+/5  ?Ankle plantarflexion      ?Ankle inversion      ?Ankle eversion      ? (Blank rows = not tested) ?  ?   ?FUNCTIONAL TESTS:  ?5 times sit to stand: 23 sec ?  ?GAIT: ?Distance walked: 65 ?Assistive device utilized: None ?Level of assistance: SBA ?Comments: antalgic gait, decreased stance Left lower extremity ?  ?  ?  ?TODAY'S TREATMENT  ? 06/02/21: ?  Supine: Log rolling reviewed mechanics, cueing for Lt arm across body to improve mobility without assistance ?Deep breathing with one hand on chest/stomach 1 min ?   Abdominal bracing paired with exhale 3" holds x 1 min  ?   Bent knee raise with ab set 10x 3" with feet on 4in (reduce height as able if performed in future) ?   Bridge 10x ?  Sidelying: Clam 10x 3" ?    ?05/26/21 ?LTR x 10 ?ABD bracing x 5" x 10 ?Hooklying Single Knee to Chest Stretch x 10" hold x 10 ?Hooklying hip ADD with ball x 10 ?Hooklying hip ABD with belt x 10 ?Log roll for supine to sit; needs mod A ? ? ? ?  ?  ?PATIENT EDUCATION:  ?Education details: good sitting posture, HEP and plan of care ?Person educated: Patient ?Education method: Explanation, Demonstration, Tactile cues, Verbal cues, and Handouts ?Education comprehension: verbalized understanding, returned demonstration, verbal cues required, tactile cues required, and needs further education ?  ?  ?HOME EXERCISE PROGRAM: ?Access Code: [email or web address] ?URL: [email or web address] ?Date: 05/26/2021 ?Prepared by: [facility] ? ?Exercises ?- Hooklying Isometric Clamshell  - 1 x daily - 7 x weekly - 1 sets - 10 reps ?- Supine Hip Adduction Isometric with Ball  - 1 x daily - 7 x weekly - 1 sets - 10 reps ? ? ? ?Access Code: [email or web address] ?URL: [email or web address] ?Date: 05/19/2021 ?Prepared by: [facility] ?  ?Exercises ?- Supine Lower Trunk Rotation  - 2 x daily - 7 x weekly - 1 sets - 10 reps - 2 -4 sec hold hold ?- Supine Transversus Abdominis Bracing - Hands on Stomach  - 2 x daily - 7 x weekly - 1 sets - 10 reps ?- Hooklying Single Knee to Chest Stretch  - 2 x daily - 7 x weekly - 1 sets - 10 reps - 10 sec hold ? 06/02/21: Bridge  and clam ? ?ASSESSMENT: ?  ?CLINICAL IMPRESSION: ?Pt limited by pain through session.  Began session assuring good mechanics with bed mobility to reduce stress on lower back.  Pt presents with some difficulty pairing abdominal sets with breathing, encouraged engagement with exhalation to continue breathing.  Monitored pain through session.  Continued with lumbar stability core exercises with additional proximal strengthening that was tolerated  well.  No reports of increased pain through session, was limited by fatigue.  Added bridges and clams to HEP. ?  ?OBJECTIVE IMPAIRMENTS Abnormal gait, decreased activity tolerance, decreased balance, decreased coordination, decreased endurance, decreased knowledge of condition, decreased knowledge of use of DME, decreased mobility, difficulty walking, decreased ROM, decreased strength, decreased safety awareness, hypomobility, impaired perceived functional ability, increased muscle spasms, impaired flexibility, improper body mechanics, postural dysfunction, and pain.  ?  ?ACTIVITY LIMITATIONS cleaning, community activity, driving, meal prep, occupation, laundry, yard work, shopping, and yard work.  ?  ?PERSONAL FACTORS Age and Fitness are also affecting patient's functional outcome.  ?  ?  ?REHAB POTENTIAL: Good ?  ?CLINICAL DECISION MAKING: Stable/uncomplicated ?  ?EVALUATION COMPLEXITY: Low ?  ?  ?GOALS: ?Goals reviewed with patient? Yes ?  ?SHORT TERM GOALS: Target date: 06/02/2021 ?  ?Patient with be independent with initial HEP to  improve functional outcomes ?  ?Goal status: ongoing ?  ?2.  Patient will ambulate x 100 ft without pain stopping him during walk without AD to improve ability to navigate household and home distances. ?Baseline: 65 ft ?Goal status: ongoing  ?  ?LONG TERM GOALS: Target date: 06/16/2021 ?  ?Patient will be independent in self management strategies to improve quality of life and functional outcomes.  ?Goal status: ongoing  ?  ?2.   Patient will  meet predicted FOTO score to demonstrate improved overall function ?Baseline: 39 ?Goal status: ongoing  ?  ?3.  Patient will report at least 50% improvement in overall symptoms and/or function to demonstrate i

## 2021-06-03 ENCOUNTER — Ambulatory Visit (HOSPITAL_COMMUNITY): Payer: Medicare HMO | Admitting: Physical Therapy

## 2021-06-09 ENCOUNTER — Ambulatory Visit (HOSPITAL_COMMUNITY): Payer: Medicare HMO | Admitting: Physical Therapy

## 2021-06-09 DIAGNOSIS — Q7649 Other congenital malformations of spine, not associated with scoliosis: Secondary | ICD-10-CM

## 2021-06-09 DIAGNOSIS — G8929 Other chronic pain: Secondary | ICD-10-CM | POA: Diagnosis not present

## 2021-06-09 DIAGNOSIS — M545 Low back pain, unspecified: Secondary | ICD-10-CM

## 2021-06-09 NOTE — Therapy (Signed)
?OUTPATIENT PHYSICAL THERAPY TREATMENT NOTE ? ? ?Patient Name: Jeremy Mclean ?MRN: [id number] ?DOB:1951/02/04, 71 y.o., male ?Today's Date: 06/09/2021 ? ?PCP: Asencion Noble, MD ?REFERRING PROVIDER: Marvis Moeller, NP ? ?END OF SESSION:  ? PT End of Session - 06/09/21 1455   ? ? Visit Number 4   ? Number of Visits 8   ? Date for PT Re-Evaluation 06/18/21   ? Authorization Type [employer]; 8 visits approved 3/29-->06/19/21   ? Authorization Time Period 63/78/58; no cert required; pre-auth   ? Authorization - Visit Number 3   ? Progress Note Due on Visit 8   ? PT Start Time 1448   ? PT Stop Time 1526   ? PT Time Calculation (min) 38 min   ? Activity Tolerance Patient tolerated treatment well;Patient limited by pain;No increased pain   ? Behavior During Therapy [facility] for tasks assessed/performed   ? ?  ?  ? ?  ? ? ?Past Medical History:  ?Diagnosis Date  ? AAA (abdominal aortic aneurysm) ([place])   ? Anxiety   ? Arthritis   ? COPD (chronic obstructive pulmonary disease) ([place])   ? Degenerative disorder of bone   ? Depression   ? Diverticulitis   ? Dyspnea   ? due to COPD and when walking long distances per patient  ? GERD (gastroesophageal reflux disease)   ? History of kidney stones   ? Hypertension   ? Kidney stone   ? Spinal stenosis   ? ?Past Surgical History:  ?Procedure Laterality Date  ? ABDOMINAL AORTIC ANEURYSM REPAIR N/A 01/16/2019  ? Procedure: OPEN REPAIR OF ABDOMINAL AORTIC ANEURYSM;  Surgeon: Rosetta Posner, MD;  Location: [facility] OR;  Service: Vascular;  Laterality: N/A;  ? BIOPSY  01/14/2017  ? Procedure: BIOPSY;  Surgeon: Danie Binder, MD;  [facility];  Service: Endoscopy;;  gastric ?  ? CERVICAL SPINE SURGERY    ? C6-7 ACDF 09/22/09  ? COLONOSCOPY N/A 04/09/2014  ? Procedure: COLONOSCOPY;  Surgeon: Rogene Houston, MD;  [facility];  Service: Endoscopy;  Laterality: N/A;  730 - moved to 7:30 - Ann to notify pt  ? CYSTOSCOPY    ? with stent  ? ESOPHAGOGASTRODUODENOSCOPY N/A 01/14/2017   ? Procedure: ESOPHAGOGASTRODUODENOSCOPY (EGD);  Surgeon: Danie Binder, MD;  [facility];  Service: Endoscopy;  Laterality: N/A;  ? kidney stent    ? ?Patient Active Problem List  ? Diagnosis Date Noted  ? Low back pain 08/21/2019  ? AAA (abdominal aortic aneurysm) ([place]) 01/16/2019  ? UGIB (upper gastrointestinal bleed)   ? Acute blood loss anemia 01/13/2017  ? Melena 01/13/2017  ? Precordial chest pain 01/13/2017  ? Iliac artery aneurysm ([place]) 07/11/2014  ? Tobacco abuse 07/11/2014  ? History of colonic polyps 03/18/2014  ? Abdominal aortic aneurysm ([place]) 03/18/2014  ? Essential hypertension 03/18/2014  ? ? ?REFERRING DIAG: low back pain, Bertolotti's Syndrome- ? ?THERAPY DIAG:  ?Chronic low back pain, unspecified back pain laterality, unspecified whether sciatica present ? ? ?PRECAUTIONS: fall ? ?SUBJECTIVE: Pt states his pain has been higher since last Monday.  States he has taken more pain meds than he needs to but the pain is so bad.  States he quit doing the exercises because they were hurting as well.  Pt states he sat for almost 2 hours yesterday fishing and that didn't help either. ?PAIN:  ?Are you having pain? Yes: NPRS scale: 8/10 ?Pain location: low back ?Pain description:  constant aching  ?Aggravating factors: standing, walking ?Relieving factors: sit, rest ? ? ? ? ? ?OBJECTIVE:  ?  ?  ?PALPATION: ?Tender L4-L5 paraspinals; top of sacral area ?  ?LUMBAR ROM:  ?  ?Active  A/PROM  ?05/19/2021  ?Flexion 85% available  ?Extension 30% available  ?Right lateral flexion 50% available  ?Left lateral flexion 50% available  ?Right rotation 30% available  ?Left rotation 30% available  ? (Blank rows = not tested) ?  ?  ?LE MMT: ?  ?MMT Right ?05/19/2021 Left ?05/19/2021  ?Hip flexion 4+/5 4/5  ?Knee flexion 4+/5 3-/5  ?Knee extension 4+/5 4-/5  ?Ankle dorsiflexion 4+/5 4+/5  ? (Blank rows = not tested) ?  ?  ?FUNCTIONAL TESTS:  ?5 times sit to stand: 23 sec ?  ?GAIT: ?Distance walked: 65 ?Assistive  device utilized: None ?Level of assistance: SBA ?Comments: antalgic gait, decreased stance Left lower extremity ?  ?  ?  ?TODAY'S TREATMENT  ? 06/09/21: ? Supine on moist heat ? Decompression exercises 1-4 5X3" holds each ? Abdominal bracing with breathing technique 20X3" ? Bridge 10X ? ?06/02/21: ?Supine: Log rolling reviewed mechanics, cueing for Lt arm across body to improve mobility without assistance ?Deep breathing with one hand on chest/stomach 1 min ?  Abdominal bracing paired with exhale 3" holds x 1 min  ?Bent knee raise with ab set 10x 3" with feet on 4in (reduce height as able if performed in future) ?  Bridge 10x ?  Sidelying: Clam 10x 3" ?    ?05/26/21 ?LTR x 10 ?ABD bracing x 5" x 10 ?Hooklying Single Knee to Chest Stretch x 10" hold x 10 ?Hooklying hip ADD with ball x 10 ?Hooklying hip ABD with belt x 10 ?Log roll for supine to sit; needs mod A ? ? ? ?  ?  ?PATIENT EDUCATION:  ?Education details: 4/19: use of modalities to help with pain, not sitting too long;  eval: good sitting posture, HEP and plan of care ?Person educated: Patient ?Education method: Explanation, Demonstration, Tactile cues, Verbal cues, and Handouts ?Education comprehension: verbalized understanding, returned demonstration, verbal cues required, tactile cues required, and needs further education ?  ?  ?HOME EXERCISE PROGRAM: ?06/02/21: Bridge and clam ? ?Access Code: [email or web address] ?Date: 05/26/2021 clams, hip add iso with ball ? ?Access Code: [email or web address] ?  Date: 05/19/2021 trunk rotations, abd bracing, single knee    to chest stretch ? ? ? ?ASSESSMENT: ?  ?CLINICAL IMPRESSION: ?Pt limited by pain again today.  Added moist heat with supine exercises this session.  Began with decompression exercises and no issues/complaints completing these.  Bridge remains challenging with minimal rise due to weakness/pain. Continued difficulty with breathing correctly while completing therex requiring cues. Pt utilizes logroll technique to complete supine  to/from sit transfers. Pt reported some relief with moist heat but overall feels he is getting worse instead of better.  Educated it may be a little more sore in the beginning due to working mm that are weak; recommend he try moist heat at home to help with pain.  Pt will continue to benefit from skilled physical therapy. ?  ?OBJECTIVE IMPAIRMENTS Abnormal gait, decreased activity tolerance, decreased balance, decreased coordination, decreased endurance, decreased knowledge of condition, decreased knowledge of use of DME, decreased mobility, difficulty walking, decreased ROM, decreased strength, decreased safety awareness, hypomobility, impaired perceived functional ability, increased muscle spasms, impaired flexibility, improper body mechanics, postural dysfunction, and pain.  ?  ?ACTIVITY LIMITATIONS cleaning, community activity, driving, meal prep, occupation, laundry,  yard work, shopping, and yard work.  ?  ?PERSONAL FACTORS Age and Fitness are also affecting patient's functional outcome.  ?  ?  ?REHAB POTENTIAL: Good ?  ?CLINICAL DECISION MAKING: Stable/uncomplicated ?  ?EVALUATION COMPLEXITY: Low ?  ?  ?GOALS: ?Goals reviewed with patient? Yes ?  ?SHORT TERM GOALS: Target date: 06/02/2021 ?  ?Patient with be independent with initial HEP to  improve functional outcomes ?  ?Goal status: ongoing ?  ?2.  Patient will ambulate x 100 ft without pain stopping him during walk without AD to improve ability to navigate household and home distances. ?Baseline: 65 ft ?Goal status: ongoing  ?  ?LONG TERM GOALS: Target date: 06/16/2021 ?  ?Patient will be independent in self management strategies to improve quality of life and functional outcomes.  ?Goal status: ongoing  ?  ?2.   Patient will meet predicted FOTO score to demonstrate improved overall function ?Baseline: 39 ?Goal status: ongoing  ?  ?3.  Patient will report at least 50% improvement in overall symptoms and/or function to demonstrate improved functional  mobility ?  ?  ?Goal status: ongoing  ?  ?4.  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   ?Baseline: 65 ?Goal st

## 2021-06-11 ENCOUNTER — Encounter (HOSPITAL_COMMUNITY): Payer: Medicare HMO

## 2021-06-11 DIAGNOSIS — Q7649 Other congenital malformations of spine, not associated with scoliosis: Secondary | ICD-10-CM

## 2021-06-11 DIAGNOSIS — G8929 Other chronic pain: Secondary | ICD-10-CM

## 2021-06-14 ENCOUNTER — Ambulatory Visit (HOSPITAL_COMMUNITY): Payer: Medicare HMO

## 2021-06-14 DIAGNOSIS — G8929 Other chronic pain: Secondary | ICD-10-CM

## 2021-06-14 DIAGNOSIS — M545 Low back pain, unspecified: Secondary | ICD-10-CM

## 2021-06-14 DIAGNOSIS — Q7649 Other congenital malformations of spine, not associated with scoliosis: Secondary | ICD-10-CM | POA: Diagnosis not present

## 2021-06-14 NOTE — Therapy (Signed)
OUTPATIENT PHYSICAL THERAPY TREATMENT NOTE   Patient Name: Jeremy Mclean MRN: [id number] DOB:11-18-1950, 71 y.o., male Today's Date: 06/14/2021  PCP: Carylon Perches, MD REFERRING PROVIDER: Council Mechanic, NP  END OF SESSION:   PT End of Session - 06/14/21 1433     Visit Number 5    Number of Visits 8    Date for PT Re-Evaluation 06/18/21    Authorization Type [employer]; 8 visits approved 3/29-->06/19/21    Authorization Time Period 40/98/11; no cert required; pre-auth    Authorization - Visit Number 4    Progress Note Due on Visit 8    PT Start Time 1432    PT Stop Time 1515    PT Time Calculation (min) 43 min    Activity Tolerance Patient tolerated treatment well;Patient limited by pain;No increased pain    Behavior During Therapy WFL for tasks assessed/performed              Past Medical History:  Diagnosis Date   AAA (abdominal aortic aneurysm) (HCC)    Anxiety    Arthritis    COPD (chronic obstructive pulmonary disease) (HCC)    Degenerative disorder of bone    Depression    Diverticulitis    Dyspnea    due to COPD and when walking long distances per patient   GERD (gastroesophageal reflux disease)    History of kidney stones    Hypertension    Kidney stone    Spinal stenosis    Past Surgical History:  Procedure Laterality Date   ABDOMINAL AORTIC ANEURYSM REPAIR N/A 01/16/2019   Procedure: OPEN REPAIR OF ABDOMINAL AORTIC ANEURYSM;  Surgeon: Larina Earthly, MD;  Location: [facility] OR;  Service: Vascular;  Laterality: N/A;   BIOPSY  01/14/2017   Procedure: BIOPSY;  Surgeon: West Bali, MD;  Location: [facility];  Service: Endoscopy;;  gastric    CERVICAL SPINE SURGERY     C6-7 ACDF 09/22/09   COLONOSCOPY N/A 04/09/2014   Procedure: COLONOSCOPY;  Surgeon: Malissa Hippo, MD;  Location: [facility];  Service: Endoscopy;  Laterality: N/A;  730 - moved to 7:30 - Ann to notify pt   CYSTOSCOPY     with stent   ESOPHAGOGASTRODUODENOSCOPY N/A  01/14/2017   Procedure: ESOPHAGOGASTRODUODENOSCOPY (EGD);  Surgeon: West Bali, MD;  Location: [facility];  Service: Endoscopy;  Laterality: N/A;   kidney stent     Patient Active Problem List   Diagnosis Date Noted   Low back pain 08/21/2019   AAA (abdominal aortic aneurysm) (HCC) 01/16/2019   UGIB (upper gastrointestinal bleed)    Acute blood loss anemia 01/13/2017   Melena 01/13/2017   Precordial chest pain 01/13/2017   Iliac artery aneurysm (HCC) 07/11/2014   Tobacco abuse 07/11/2014   History of colonic polyps 03/18/2014   Abdominal aortic aneurysm (HCC) 03/18/2014   Essential hypertension 03/18/2014    REFERRING DIAG: low back pain, Bertolotti's Syndrome-  THERAPY DIAG:  Chronic low back pain, unspecified back pain laterality, unspecified whether sciatica present  Bertolotti's syndrome   PRECAUTIONS: fall  SUBJECTIVE: Patient reports continued high pain levels but he tried a muscle relaxer and that seemed to help some with his pain. Overall he needs medication to control his pain levels.    PAIN:  Are you having pain? Yes: NPRS scale: 3/10 Pain location: low back Pain description: constant aching  Aggravating factors: standing, walking Relieving factors: sit, rest      OBJECTIVE:  PALPATION: Tender L4-L5 paraspinals; top of sacral area   LUMBAR ROM:    Active  A/PROM  05/19/2021  Flexion 85% available  Extension 30% available  Right lateral flexion 50% available  Left lateral flexion 50% available  Right rotation 30% available  Left rotation 30% available   (Blank rows = not tested)     LE MMT:   MMT Right 05/19/2021 Left 05/19/2021  Hip flexion 4+/5 4/5  Knee flexion 4+/5 3-/5  Knee extension 4+/5 4-/5  Ankle dorsiflexion 4+/5 4+/5   (Blank rows = not tested)     FUNCTIONAL TESTS:  5 times sit to stand: 23 sec   GAIT: Distance walked: 65 Assistive device utilized: None Level of assistance: SBA Comments: antalgic gait,  decreased stance Left lower extremity       TODAY'S TREATMENT  06/14/21 Supine on heat with legs elevated x 5' before start of exercise  Supine: Supine single knee to chest using strap 5 x 20" each leg LTR 15 x 10" Bridge x 10 Abdominal  bracing 5" x 10 Abdominal bracing with marching x 10   Prone lying x 5'; trial of anterior mobilization x 10 reps L4-L5-S1 with slight superior direction          06/09/21:  Supine on moist heat  Decompression exercises 1-4 5X3" holds each  Abdominal bracing with breathing technique 20X3"  Bridge 10X  06/02/21: Supine: Log rolling reviewed mechanics, cueing for Lt arm across body to improve mobility without assistance Deep breathing with one hand on chest/stomach 1 min   Abdominal bracing paired with exhale 3" holds x 1 min  Bent knee raise with ab set 10x 3" with feet on 4in (reduce height as able if performed in future)   Bridge 10x   Sidelying: Clam 10x 3"     05/26/21 LTR x 10 ABD bracing x 5" x 10 Hooklying Single Knee to Chest Stretch x 10" hold x 10 Hooklying hip ADD with ball x 10 Hooklying hip ABD with belt x 10 Log roll for supine to sit; needs mod A        PATIENT EDUCATION:  Education details: 4/19: use of modalities to help with pain, not sitting too long;  eval: good sitting posture, HEP and plan of care Person educated: Patient Education method: Explanation, Demonstration, Tactile cues, Verbal cues, and Handouts Education comprehension: verbalized understanding, returned demonstration, verbal cues required, tactile cues required, and needs further education     HOME EXERCISE PROGRAM: 06/02/21: Bridge and clam  Access Code: [email or web address] Date: 05/26/2021 clams, hip add iso with ball  Access Code: [email or web address]   Date: 05/19/2021 trunk rotations, abd bracing, single knee    to chest stretch    ASSESSMENT:   CLINICAL IMPRESSION: Today patient reports decreased pain levels after trying his muscle relaxer; continued  with lumbar mobility and stabilization exercise; added marching with abdominal bracing to challenge core strength; the last couple of repetitions the patient started getting a cramp in his left hip; trial of prone lying and patient reports he felt pretty good in that position.  Trial of lumbar mobilization and again he felt pretty good with that. Patient still needs reminders for correct breathing and log roll technique.  No increased pain after treatment today and patient with noted decreased antalgic gait walking out today. Patient will benefit from continued skilled therapy interventions to address deficits and improve functional mobility.     OBJECTIVE IMPAIRMENTS Abnormal gait, decreased activity tolerance, decreased balance, decreased coordination, decreased  endurance, decreased knowledge of condition, decreased knowledge of use of DME, decreased mobility, difficulty walking, decreased ROM, decreased strength, decreased safety awareness, hypomobility, impaired perceived functional ability, increased muscle spasms, impaired flexibility, improper body mechanics, postural dysfunction, and pain.    ACTIVITY LIMITATIONS cleaning, community activity, driving, meal prep, occupation, laundry, yard work, shopping, and yard work.    PERSONAL FACTORS Age and Fitness are also affecting patient's functional outcome.      REHAB POTENTIAL: Good   CLINICAL DECISION MAKING: Stable/uncomplicated   EVALUATION COMPLEXITY: Low     GOALS: Goals reviewed with patient? Yes   SHORT TERM GOALS: Target date: 06/02/2021   Patient with be independent with initial HEP to  improve functional outcomes   Goal status: ongoing   2.  Patient will ambulate x 100 ft without pain stopping him during walk without AD to improve ability to navigate household and home distances. Baseline: 65 ft Goal status: ongoing    LONG TERM GOALS: Target date: 06/16/2021   Patient will be independent in self management strategies to  improve quality of life and functional outcomes.  Goal status: ongoing    2.   Patient will meet predicted FOTO score to demonstrate improved overall function Baseline: 39 Goal status: ongoing    3.  Patient will report at least 50% improvement in overall symptoms and/or function to demonstrate improved functional mobility     Goal status: ongoing    4.  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   Baseline: 65 Goal status: ongoing        PLAN: PT FREQUENCY: 2x/week   PT DURATION: 4 weeks   PLANNED INTERVENTIONS: Therapeutic exercises, Therapeutic activity, Neuromuscular re-education, Balance training, Gait training, Patient/Family education, Joint manipulation, Joint mobilization, Stair training, Vestibular training, Canalith repositioning, DME instructions, Aquatic Therapy, Dry Needling, Electrical stimulation, Spinal manipulation, Spinal mobilization, Cryotherapy, Moist heat, Compression bandaging, Taping, Traction, Ultrasound, Biofeedback, and Manual therapy.   PLAN FOR NEXT SESSION:  progress lumbar stabilization and mobility as able. Assess response to mob trail last visit; and how he tolerates prone; he tends to be a little flexed in prone due to soft tissue in his abdominal area.   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     3:29 PM, 06/14/21 Kamryn Gauthier Small Cambri Plourde MPT [facility] [place] [phone]

## 2021-06-16 ENCOUNTER — Ambulatory Visit (HOSPITAL_COMMUNITY): Payer: Medicare HMO

## 2021-06-16 DIAGNOSIS — Q7649 Other congenital malformations of spine, not associated with scoliosis: Secondary | ICD-10-CM

## 2021-06-16 DIAGNOSIS — M545 Low back pain, unspecified: Secondary | ICD-10-CM | POA: Diagnosis not present

## 2021-06-16 DIAGNOSIS — G8929 Other chronic pain: Secondary | ICD-10-CM | POA: Diagnosis not present

## 2021-06-16 NOTE — Therapy (Addendum)
OUTPATIENT PHYSICAL THERAPY PROGRESS NOTE  Progress Note Reporting Period 05/19/21 to 06/16/21  See note below for Objective Data and Assessment of Progress/Goals.       Patient Name: Jeremy Mclean MRN: [id number] DOB:Feb 28, 1950, 71 y.o., male Today's Date: 06/16/2021  PCP: Carylon Perches, MD REFERRING PROVIDER: Council Mechanic, NP  END OF SESSION:   PT End of Session - 06/16/21 1355     Visit Number 6    Number of Visits 8    Date for PT Re-Evaluation 06/18/21    Authorization Type [employer]; 8 visits approved 3/29-->06/19/21    Authorization Time Period 40/98/11; no cert required; pre-auth    Authorization - Visit Number 4    Progress Note Due on Visit 8    PT Start Time 1351    PT Stop Time 1432    PT Time Calculation (min) 41 min    Activity Tolerance Patient tolerated treatment well;Patient limited by pain;No increased pain    Behavior During Therapy WFL for tasks assessed/performed               Past Medical History:  Diagnosis Date   AAA (abdominal aortic aneurysm) (HCC)    Anxiety    Arthritis    COPD (chronic obstructive pulmonary disease) (HCC)    Degenerative disorder of bone    Depression    Diverticulitis    Dyspnea    due to COPD and when walking long distances per patient   GERD (gastroesophageal reflux disease)    History of kidney stones    Hypertension    Kidney stone    Spinal stenosis    Past Surgical History:  Procedure Laterality Date   ABDOMINAL AORTIC ANEURYSM REPAIR N/A 01/16/2019   Procedure: OPEN REPAIR OF ABDOMINAL AORTIC ANEURYSM;  Surgeon: Larina Earthly, MD;  [facility] OR;  Service: Vascular;  Laterality: N/A;   BIOPSY  01/14/2017   Procedure: BIOPSY;  Surgeon: West Bali, MD;  [facility]: [facility];  Service: Endoscopy;;  gastric    CERVICAL SPINE SURGERY     C6-7 ACDF 09/22/09   COLONOSCOPY N/A 04/09/2014   Procedure: COLONOSCOPY;  Surgeon: Malissa Hippo, MD;  Location: [facility];  Service:  Endoscopy;  Laterality: N/A;  730 - moved to 7:30 - Ann to notify pt   CYSTOSCOPY     with stent   ESOPHAGOGASTRODUODENOSCOPY N/A 01/14/2017   Procedure: ESOPHAGOGASTRODUODENOSCOPY (EGD);  Surgeon: West Bali, MD;  Location: [facility];  Service: Endoscopy;  Laterality: N/A;   kidney stent     Patient Active Problem List   Diagnosis Date Noted   Low back pain 08/21/2019   AAA (abdominal aortic aneurysm) (HCC) 01/16/2019   UGIB (upper gastrointestinal bleed)    Acute blood loss anemia 01/13/2017   Melena 01/13/2017   Precordial chest pain 01/13/2017   Iliac artery aneurysm (HCC) 07/11/2014   Tobacco abuse 07/11/2014   History of colonic polyps 03/18/2014   Abdominal aortic aneurysm (HCC) 03/18/2014   Essential hypertension 03/18/2014    REFERRING DIAG: low back pain, Bertolotti's Syndrome-  THERAPY DIAG:  Chronic low back pain, unspecified back pain laterality, unspecified whether sciatica present  Bertolotti's syndrome   PRECAUTIONS: fall  SUBJECTIVE: Patient states he felt that last session helped some; he reports he woke up this morning though with increased pain in his back; had to take a pain pill to control. Was a 6/10 before pain meds.   PAIN:  Are you having pain?  Yes: NPRS scale: 2-3/10 Pain location: low back Pain description: constant aching  Aggravating factors: standing, walking Relieving factors: sit, rest      OBJECTIVE: (objective measures completed at initial evaluation unless otherwise dated)      PALPATION: Tender L4-L5 paraspinals; top of sacral area   LUMBAR ROM:    Active  AROM  05/19/2021 AROM 06/16/21  Flexion 85% available wfl  Extension 30% available 50% available no pain today  Right lateral flexion 50% available   Left lateral flexion 50% available   Right rotation 30% available   Left rotation 30% available    (Blank rows = not tested)     LE MMT:   MMT Right 05/19/2021 Left 05/19/2021 Left 06/16/21  Hip flexion  4+/5 4/5   Knee flexion 4+/5 3-/5 3+/5  Knee extension 4+/5 4-/5 4/5  Ankle dorsiflexion 4+/5 4+/5    (Blank rows = not tested)     FUNCTIONAL TESTS:  5 times sit to stand: 23 sec   GAIT: Distance walked: 65 Assistive device utilized: None Level of assistance: SBA Comments: antalgic gait, decreased stance Left lower extremity       TODAY'S TREATMENT  06/16/21 Supine on heat with legs elevated x 5' before start of exercise   Supine: LTR 15 x 10" Supine single knee to chest using strap 5 x 20" each leg Bridge x 10 Abdominal  bracing 5" x 10 Abdominal bracing with marching x 10   Prone lying x 5';  anterior mobilization x 10 reps L4-L5-S1 with slight superior direction     06/14/21 Supine on heat with legs elevated x 5' before start of exercise  Supine: Supine single knee to chest using strap 5 x 20" each leg LTR 15 x 10" Bridge x 10 Abdominal  bracing 5" x 10 Abdominal bracing with marching x 10   Prone lying x 5'; trial of anterior mobilization x 10 reps L4-L5-S1 with slight superior direction          06/09/21:  Supine on moist heat  Decompression exercises 1-4 5X3" holds each  Abdominal bracing with breathing technique 20X3"  Bridge 10X  06/02/21: Supine: Log rolling reviewed mechanics, cueing for Lt arm across body to improve mobility without assistance Deep breathing with one hand on chest/stomach 1 min   Abdominal bracing paired with exhale 3" holds x 1 min  Bent knee raise with ab set 10x 3" with feet on 4in (reduce height as able if performed in future)   Bridge 10x   Sidelying: Clam 10x 3"     05/26/21 LTR x 10 ABD bracing x 5" x 10 Hooklying Single Knee to Chest Stretch x 10" hold x 10 Hooklying hip ADD with ball x 10 Hooklying hip ABD with belt x 10 Log roll for supine to sit; needs mod A        PATIENT EDUCATION:  Education details: 4/19: use of modalities to help with pain, not sitting too long;  eval: good sitting posture, HEP and  plan of care Person educated: Patient Education method: Explanation, Demonstration, Tactile cues, Verbal cues, and Handouts Education comprehension: verbalized understanding, returned demonstration, verbal cues required, tactile cues required, and needs further education     HOME EXERCISE PROGRAM: 06/02/21: Bridge and clam  Access Code: [email or web address] Date: 05/26/2021 clams, hip add iso with ball  Access Code: [email or web address]   Date: 05/19/2021 trunk rotations, abd bracing, single knee    to chest stretch    ASSESSMENT:   CLINICAL IMPRESSION: Today's  session starting with moist heat to low back x 5 min to decrease pain as he continues with back pain. Patient only able to partially clear his buttocks with bridge. Continues with lumbar mobility and core stabilization; prone lying with mobilization L4-L5  today; he tends to be a little flexed in prone due to soft tissue in his abdominal area. Patient with decreased pain to 1/10 after treatment.  He demonstrates improved lumbar extension and increased left knee flexion and extension strength today.  Patient will benefit from continued skilled therapy interventions to address deficits and improve functional mobility.     OBJECTIVE IMPAIRMENTS Abnormal gait, decreased activity tolerance, decreased balance, decreased coordination, decreased endurance, decreased knowledge of condition, decreased knowledge of use of DME, decreased mobility, difficulty walking, decreased ROM, decreased strength, decreased safety awareness, hypomobility, impaired perceived functional ability, increased muscle spasms, impaired flexibility, improper body mechanics, postural dysfunction, and pain.    ACTIVITY LIMITATIONS cleaning, community activity, driving, meal prep, occupation, laundry, yard work, shopping, and yard work.    PERSONAL FACTORS Age and Fitness are also affecting patient's functional outcome.      REHAB POTENTIAL: Good   CLINICAL DECISION MAKING:  Stable/uncomplicated   EVALUATION COMPLEXITY: Low     GOALS: Goals reviewed with patient? Yes   SHORT TERM GOALS: Target date: 06/02/2021   Patient with be independent with initial HEP to  improve functional outcomes   Goal status: ongoing   2.  Patient will ambulate x 100 ft without pain stopping him during walk without AD to improve ability to navigate household and home distances. Baseline: 65 ft Goal status: ongoing    LONG TERM GOALS: Target date: 06/16/2021   Patient will be independent in self management strategies to improve quality of life and functional outcomes.  Goal status: ongoing    2.   Patient will meet predicted FOTO score to demonstrate improved overall function Baseline: 39 Goal status: ongoing    3.  Patient will report at least 50% improvement in overall symptoms and/or function to demonstrate improved functional mobility     Goal status: ongoing    4.  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   Baseline: 65 Goal status: ongoing        PLAN: PT FREQUENCY: 2x/week   PT DURATION: 4 weeks   PLANNED INTERVENTIONS: Therapeutic exercises, Therapeutic activity, Neuromuscular re-education, Balance training, Gait training, Patient/Family education, Joint manipulation, Joint mobilization, Stair training, Vestibular training, Canalith repositioning, DME instructions, Aquatic Therapy, Dry Needling, Electrical stimulation, Spinal manipulation, Spinal mobilization, Cryotherapy, Moist heat, Compression bandaging, Taping, Traction, Ultrasound, Biofeedback, and Manual therapy.   PLAN FOR NEXT SESSION:  progress lumbar stabilization and mobility as able. Try prone glute sets, hip ext.  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     3:41 PM, 06/16/21 Malissa Slay Small Daviana Haymaker MPT [facility] [place]  [phone]

## 2021-06-29 ENCOUNTER — Ambulatory Visit (HOSPITAL_COMMUNITY): Payer: Medicare HMO | Attending: Neurosurgery

## 2021-06-29 DIAGNOSIS — Q7649 Other congenital malformations of spine, not associated with scoliosis: Secondary | ICD-10-CM | POA: Insufficient documentation

## 2021-06-29 DIAGNOSIS — M545 Low back pain, unspecified: Secondary | ICD-10-CM | POA: Diagnosis not present

## 2021-06-29 DIAGNOSIS — G8929 Other chronic pain: Secondary | ICD-10-CM | POA: Insufficient documentation

## 2021-06-29 NOTE — Addendum Note (Signed)
Addended byDonnal Debar, Yurianna Tusing S on: 06/29/2021 11:18 AM ? ? Modules accepted: Orders ? ?

## 2021-06-29 NOTE — Therapy (Addendum)
OUTPATIENT PHYSICAL THERAPY TREATMENT NOTE  *progress note last visit; sent new cert request       Patient Name: Jeremy Mclean MRN: [id number] DOB:09/23/1950, 71 y.o., male Today's Date: 07/01/2021  PCP: Carylon Perches, MD REFERRING PROVIDER: Council Mechanic, NP  END OF SESSION:        Past Medical History:  Diagnosis Date   AAA (abdominal aortic aneurysm) (HCC)    Anxiety    Arthritis    COPD (chronic obstructive pulmonary disease) (HCC)    Degenerative disorder of bone    Depression    Diverticulitis    Dyspnea    due to COPD and when walking long distances per patient   GERD (gastroesophageal reflux disease)    History of kidney stones    Hypertension    Kidney stone    Spinal stenosis    Past Surgical History:  Procedure Laterality Date   ABDOMINAL AORTIC ANEURYSM REPAIR N/A 01/16/2019   Procedure: OPEN REPAIR OF ABDOMINAL AORTIC ANEURYSM;  Surgeon: Larina Earthly, MD;  Location: [facility] OR;  Service: Vascular;  Laterality: N/A;   BIOPSY  01/14/2017   Procedure: BIOPSY;  Surgeon: West Bali, MD;  [facility];  Service: Endoscopy;;  gastric    CERVICAL SPINE SURGERY     C6-7 ACDF 09/22/09   COLONOSCOPY N/A 04/09/2014   Procedure: COLONOSCOPY;  Surgeon: Malissa Hippo, MD;  Location: [facility];  Service: Endoscopy;  Laterality: N/A;  730 - moved to 7:30 - Ann to notify pt   CYSTOSCOPY     with stent   ESOPHAGOGASTRODUODENOSCOPY N/A 01/14/2017   Procedure: ESOPHAGOGASTRODUODENOSCOPY (EGD);  Surgeon: West Bali, MD;  Location: [facility];  Service: Endoscopy;  Laterality: N/A;   kidney stent     Patient Active Problem List   Diagnosis Date Noted   Low back pain 08/21/2019   AAA (abdominal aortic aneurysm) (HCC) 01/16/2019   UGIB (upper gastrointestinal bleed)    Acute blood loss anemia 01/13/2017   Melena 01/13/2017   Precordial chest pain 01/13/2017   Iliac artery aneurysm (HCC) 07/11/2014   Tobacco abuse 07/11/2014   History  of colonic polyps 03/18/2014   Abdominal aortic aneurysm (HCC) 03/18/2014   Essential hypertension 03/18/2014    REFERRING DIAG: low back pain, Bertolotti's Syndrome-  THERAPY DIAG:  Chronic low back pain, unspecified back pain laterality, unspecified whether sciatica present  Bertolotti's syndrome   PRECAUTIONS: fall  SUBJECTIVE:Patient states overall much improved 1-2/10 pain during the day but is still taking Tramadol first thing in the morning.   PAIN:  Are you having pain? Yes: NPRS scale: 2/10 Pain location: low back Pain description: constant aching  Aggravating factors: standing, walking Relieving factors: sit, rest      OBJECTIVE: (objective measures completed at initial evaluation unless otherwise dated)      PALPATION: Tender L4-L5 paraspinals; top of sacral area   LUMBAR ROM:    Active  AROM  05/19/2021 AROM 06/16/21  Flexion 85% available wfl  Extension 30% available 50% available no pain today  Right lateral flexion 50% available   Left lateral flexion 50% available   Right rotation 30% available   Left rotation 30% available    (Blank rows = not tested)     LE MMT:   MMT Right 05/19/2021 Left 05/19/2021 Left 06/16/21  Hip flexion 4+/5 4/5   Knee flexion 4+/5 3-/5 3+/5  Knee extension 4+/5 4-/5 4/5  Ankle dorsiflexion 4+/5 4+/5    (Blank  rows = not tested)     FUNCTIONAL TESTS:  5 times sit to stand: 23 sec   GAIT: Distance walked: 65 Assistive device utilized: None Level of assistance: SBA Comments: antalgic gait, decreased stance Left lower extremity       TODAY'S TREATMENT  06/29/21 Supine on heat with legs elevated x 5' before start of exercise Supine: LTR 15 x 10" Supine single knee to chest using strap 5 x 20" each leg Bridge x 10 Abdominal  bracing 5" x 10 Abdominal bracing with marching x 10   Prone lying x 5';  anterior mobilization x 10 reps L4-L5-S1 with slight superior direction  FOTO 52    06/16/21 Supine on  heat with legs elevated x 5' before start of exercise   Supine: LTR 15 x 10" Supine single knee to chest using strap 5 x 20" each leg Bridge x 10 Abdominal  bracing 5" x 10 Abdominal bracing with marching x 10   Prone lying x 5';  anterior mobilization x 10 reps L4-L5-S1 with slight superior direction     06/14/21 Supine on heat with legs elevated x 5' before start of exercise  Supine: Supine single knee to chest using strap 5 x 20" each leg LTR 15 x 10" Bridge x 10 Abdominal  bracing 5" x 10 Abdominal bracing with marching x 10   Prone lying x 5'; trial of anterior mobilization x 10 reps L4-L5-S1 with slight superior direction            PATIENT EDUCATION:  Education details: 06/29/21: plan of care; discussed benefit of massage and pool exercise   eval: good sitting posture, HEP and plan of care Person educated: Patient Education method: Explanation, Demonstration, Tactile cues, Verbal cues, and Handouts Education comprehension: verbalized understanding, returned demonstration, verbal cues required, tactile cues required, and needs further education     HOME EXERCISE PROGRAM: 06/02/21: Bridge and clam  Access Code: [email or web address] Date: 05/26/2021 clams, hip add iso with ball  Access Code: [email or web address]   Date: 05/19/2021 trunk rotations, abd bracing, single knee    to chest stretch    ASSESSMENT:   CLINICAL IMPRESSION: Today's session continued to focus on pain relief and core strengthening; mobility.  He improved his FOTO score to 52 today achieving FOTO goal. He continues with walking limitations.  Pain level 1/10 after treatment; reports decreased pain and pressure in low back after mobilization Patient will benefit from continued skilled therapy interventions to address deficits and improve functional mobility.     OBJECTIVE IMPAIRMENTS Abnormal gait, decreased activity tolerance, decreased balance, decreased coordination, decreased endurance, decreased knowledge of  condition, decreased knowledge of use of DME, decreased mobility, difficulty walking, decreased ROM, decreased strength, decreased safety awareness, hypomobility, impaired perceived functional ability, increased muscle spasms, impaired flexibility, improper body mechanics, postural dysfunction, and pain.    ACTIVITY LIMITATIONS cleaning, community activity, driving, meal prep, occupation, laundry, yard work, shopping, and yard work.    PERSONAL FACTORS Age and Fitness are also affecting patient's functional outcome.      REHAB POTENTIAL: Good   CLINICAL DECISION MAKING: Stable/uncomplicated   EVALUATION COMPLEXITY: Low     GOALS: Goals reviewed with patient? Yes   SHORT TERM GOALS: Target date: 06/02/2021   Patient with be independent with initial HEP to  improve functional outcomes   Goal status: met   2.  Patient will ambulate x 100 ft without pain stopping him during walk without AD to improve ability to navigate household and home distances.  Baseline: 65 ft Goal status: ongoing    LONG TERM GOALS: Target date: 5/292023   Patient will be independent in self management strategies to improve quality of life and functional outcomes.  Goal status: ongoing    2.   Patient will meet predicted FOTO score to demonstrate improved overall function Baseline: 39; 06/29/21 52 Goal status: met   3.  Patient will report at least 50% improvement in overall symptoms and/or function to demonstrate improved functional mobility     Goal status: ongoing    4.  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   Baseline: 65 Goal status: ongoing        PLAN: PT FREQUENCY: 2x/week   PT DURATION: 4 weeks   PLANNED INTERVENTIONS: Therapeutic exercises, Therapeutic activity, Neuromuscular re-education, Balance training, Gait training, Patient/Family education, Joint manipulation, Joint  mobilization, Stair training, Vestibular training, Canalith repositioning, DME instructions, Aquatic Therapy, Dry Needling, Electrical stimulation, Spinal manipulation, Spinal mobilization, Cryotherapy, Moist heat, Compression bandaging, Taping, Traction, Ultrasound, Biofeedback, and Manual therapy.   PLAN FOR NEXT SESSION:  progress lumbar stabilization and mobility as able. Try prone glute sets, hip ext.  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 Improve Walking tolerance. Patient interested in learning about massage therapy?     3:24 PM, 07/01/21 Jesselee Poth Small Brendan Gadson MPT [facility] [place] [phone]

## 2021-07-01 ENCOUNTER — Ambulatory Visit (HOSPITAL_COMMUNITY): Payer: Medicare HMO

## 2021-07-01 ENCOUNTER — Encounter (HOSPITAL_COMMUNITY): Payer: Self-pay

## 2021-07-01 DIAGNOSIS — G8929 Other chronic pain: Secondary | ICD-10-CM | POA: Diagnosis not present

## 2021-07-01 DIAGNOSIS — M545 Low back pain, unspecified: Secondary | ICD-10-CM | POA: Diagnosis not present

## 2021-07-01 DIAGNOSIS — Q7649 Other congenital malformations of spine, not associated with scoliosis: Secondary | ICD-10-CM | POA: Diagnosis not present

## 2021-07-01 NOTE — Therapy (Signed)
?OUTPATIENT PHYSICAL THERAPY TREATMENT NOTE ? ? ?Patient Name: Jeremy Mclean ?MRN: [id number] ?DOB:1951/01/13, 71 y.o., male ?Today's Date: 07/01/2021 ? ?PCP: Asencion Noble, MD ?REFERRING PROVIDER: Marvis Moeller, NP ? ?END OF SESSION:  ? PT End of Session - 07/01/21 1708   ? ? Visit Number 8   ? Number of Visits 16   ? Date for PT Re-Evaluation 07/29/21   ? Authorization Type [employer]; 8 visits approved 3/29-->06/19/21; 6 visits approved 4/30-->07/20/21   ? Authorization Time Period 50/72/25; no cert required; pre-auth   ? Authorization - Visit Number 2   ? Authorization - Number of Visits 6   ? Progress Note Due on Visit 16   ? PT Start Time 1619   ? PT Stop Time 7505   ? PT Time Calculation (min) 39 min   ? Activity Tolerance Patient tolerated treatment well;Patient limited by pain;No increased pain   ? Behavior During Therapy [facility] for tasks assessed/performed   ? ?  ?  ? ?  ? ? ? ? ? ? ?Past Medical History:  ?Diagnosis Date  ? AAA (abdominal aortic aneurysm) ([place])   ? Anxiety   ? Arthritis   ? COPD (chronic obstructive pulmonary disease) ([place])   ? Degenerative disorder of bone   ? Depression   ? Diverticulitis   ? Dyspnea   ? due to COPD and when walking long distances per patient  ? GERD (gastroesophageal reflux disease)   ? History of kidney stones   ? Hypertension   ? Kidney stone   ? Spinal stenosis   ? ?Past Surgical History:  ?Procedure Laterality Date  ? ABDOMINAL AORTIC ANEURYSM REPAIR N/A 01/16/2019  ? Procedure: OPEN REPAIR OF ABDOMINAL AORTIC ANEURYSM;  Surgeon: Rosetta Posner, MD;  Location: [facility] OR;  Service: Vascular;  Laterality: N/A;  ? BIOPSY  01/14/2017  ? Procedure: BIOPSY;  Surgeon: Danie Binder, MD;  [facility];  Service: Endoscopy;;  gastric ?  ? CERVICAL SPINE SURGERY    ? C6-7 ACDF 09/22/09  ? COLONOSCOPY N/A 04/09/2014  ? Procedure: COLONOSCOPY;  Surgeon: Rogene Houston, MD;  [facility];  Service: Endoscopy;  Laterality: N/A;  730 - moved to 7:30 - Ann to  notify pt  ? CYSTOSCOPY    ? with stent  ? ESOPHAGOGASTRODUODENOSCOPY N/A 01/14/2017  ? Procedure: ESOPHAGOGASTRODUODENOSCOPY (EGD);  Surgeon: Danie Binder, MD;  [facility];  Service: Endoscopy;  Laterality: N/A;  ? kidney stent    ? ?Patient Active Problem List  ? Diagnosis Date Noted  ? Low back pain 08/21/2019  ? AAA (abdominal aortic aneurysm) ([place]) 01/16/2019  ? UGIB (upper gastrointestinal bleed)   ? Acute blood loss anemia 01/13/2017  ? Melena 01/13/2017  ? Precordial chest pain 01/13/2017  ? Iliac artery aneurysm ([place]) 07/11/2014  ? Tobacco abuse 07/11/2014  ? History of colonic polyps 03/18/2014  ? Abdominal aortic aneurysm ([place]) 03/18/2014  ? Essential hypertension 03/18/2014  ? ? ?REFERRING DIAG: low back pain, Bertolotti's Syndrome- ? ?THERAPY DIAG:  ?Chronic low back pain, unspecified back pain laterality, unspecified whether sciatica present ? ?Bertolotti's syndrome ? ? ?PRECAUTIONS: fall ? ?SUBJECTIVE:Pt stated he is very sore in all joints, stated he spent ~4-6 hours planting garden yesterday and earlier today.  Current pain scale 2/10.  Reports he has improved ability to walk for further distances prior need to rest, able to walk twice a far as he used to. ? ?PAIN:  ?Are  you having pain? Yes: NPRS scale: 2/10 ?Pain location: low back ?Pain description: constant aching  ?Aggravating factors: standing, walking ?Relieving factors: sit, rest ? ? ? ? ? ?OBJECTIVE: (objective measures completed at initial evaluation unless otherwise dated) ? ?  ?  ?PALPATION: ?Tender L4-L5 paraspinals; top of sacral area ?  ?LUMBAR ROM:  ?  ?Active  AROM  ?05/19/2021 AROM ?06/16/21  ?Flexion 85% available wfl  ?Extension 30% available 50% available no pain today  ?Right lateral flexion 50% available   ?Left lateral flexion 50% available   ?Right rotation 30% available   ?Left rotation 30% available   ? (Blank rows = not tested) ?  ?  ?LE MMT: ?  ?MMT Right ?05/19/2021 Left ?05/19/2021 Left ?06/16/21  ?Hip  flexion 4+/5 4/5   ?Knee flexion 4+/5 3-/5 3+/5  ?Knee extension 4+/5 4-/5 4/5  ?Ankle dorsiflexion 4+/5 4+/5   ? (Blank rows = not tested) ?  ?  ?FUNCTIONAL TESTS:  ?5 times sit to stand: 23 sec ?  ?GAIT: ?Distance walked: 65 ?Assistive device utilized: None ?Level of assistance: SBA ?Comments: antalgic gait, decreased stance Left lower extremity ?  ?  ?  ?TODAY'S TREATMENT  ?07/01/21: ?Prone: POE x 2 ?  Press up 5x 10"  ?  Prone hip extension ? ?Supine on heat during exercises: ?Bridge  ?Bent knee raise with ab set 10x 5" ?Isometric abdominal bracing 120x 5" ? ?Amy Lymph, PT complete anterior mobilization x 10 reps L4-L5-S1 with slight superior direction ? ?06/29/21 ?Supine on heat with legs elevated x 5' before start of exercise ?Supine: ?LTR 15 x 10" ?Supine single knee to chest using strap 5 x 20" each leg ?Bridge x 10 ?Abdominal  bracing 5" x 10 ?Abdominal bracing with marching x 10  ? ?Prone lying x 5';  anterior mobilization x 10 reps L4-L5-S1 with slight superior direction ? ?FOTO 64 ? ? ? ?06/16/21 ?Supine on heat with legs elevated x 5' before start of exercise ? ? ?Supine: ?LTR 15 x 10" ?Supine single knee to chest using strap 5 x 20" each leg ?Bridge x 10 ?Abdominal  bracing 5" x 10 ?Abdominal bracing with marching x 10  ? ?Prone lying x 5';  anterior mobilization x 10 reps L4-L5-S1 with slight superior direction ? ? ? ? ?06/14/21 ?Supine on heat with legs elevated x 5' before start of exercise ? ?Supine: ?Supine single knee to chest using strap 5 x 20" each leg ?LTR 15 x 10" ?Bridge x 10 ?Abdominal  bracing 5" x 10 ?Abdominal bracing with marching x 10  ? ?Prone lying x 5'; trial of anterior mobilization x 10 reps L4-L5-S1 with slight superior direction ? ? ? ? ? ? ? ? ?   ?PATIENT EDUCATION:  ?Education details: 07/01/21: Educated scar tissue massage for self care  and benefits of massage therapy with list of local therapists. ? 06/29/21: plan of care; discussed benefit of massage and pool exercise ?  eval:  good sitting posture, HEP and plan of care ?Person educated: Patient ?Education method: Explanation, Demonstration, Tactile cues, Verbal cues, and Handouts ?Education comprehension: verbalized understanding, returned demonstration, verbal cues required, tactile cues required, and needs further education ?  ?  ?HOME EXERCISE PROGRAM: ?06/02/21: Bridge and clam ? ?Access Code: [email or web address] ?Date: 05/26/2021 clams, hip add iso with ball ? ?Access Code: [email or web address] ?  Date: 05/19/2021 trunk rotations, abd bracing, single knee to chest stretch ? ? ? ?ASSESSMENT: ?  ?CLINICAL IMPRESSION: ?Session focus with pain control  and proximal strengthening.  Added prone press ups for mobility and prone hip extension for strengthening.  Supine exercises complete on heat.  PT complete manual spinal mobilization with reports of pain reduced to 1/10.  Difficulty with abdominal sets, encouraged to complete with exhalation to reduce holding breath.  Educated scar tissue massage for self care to abdominal region.  Also discussed benefits with massage therapy for pain control and mobility, pt given list of massage therapists local.   ?  ?OBJECTIVE IMPAIRMENTS Abnormal gait, decreased activity tolerance, decreased balance, decreased coordination, decreased endurance, decreased knowledge of condition, decreased knowledge of use of DME, decreased mobility, difficulty walking, decreased ROM, decreased strength, decreased safety awareness, hypomobility, impaired perceived functional ability, increased muscle spasms, impaired flexibility, improper body mechanics, postural dysfunction, and pain.  ?  ?ACTIVITY LIMITATIONS cleaning, community activity, driving, meal prep, occupation, laundry, yard work, shopping, and yard work.  ?  ?PERSONAL FACTORS Age and Fitness are also affecting patient's functional outcome.  ?  ?  ?REHAB POTENTIAL: Good ?  ?CLINICAL DECISION MAKING: Stable/uncomplicated ?  ?EVALUATION COMPLEXITY: Low ?  ?  ?GOALS: ?Goals reviewed  with patient? Yes ?  ?SHORT TERM GOALS: Target date: 06/02/2021 ?  ?Patient with be independent with initial HEP to  improve functional outcomes ?  ?Goal status: met ?  ?2.  Patient will ambulate x 100 ft without

## 2021-07-07 ENCOUNTER — Ambulatory Visit (HOSPITAL_COMMUNITY): Payer: Medicare HMO

## 2021-07-07 DIAGNOSIS — G8929 Other chronic pain: Secondary | ICD-10-CM | POA: Diagnosis not present

## 2021-07-07 DIAGNOSIS — M545 Low back pain, unspecified: Secondary | ICD-10-CM | POA: Diagnosis not present

## 2021-07-07 DIAGNOSIS — Q7649 Other congenital malformations of spine, not associated with scoliosis: Secondary | ICD-10-CM

## 2021-07-07 NOTE — Therapy (Signed)
OUTPATIENT PHYSICAL THERAPY TREATMENT NOTE   Patient Name: Jeremy Mclean MRN: [id number] DOB:Aug 08, 1950, 71 y.o., male Today's Date: 07/07/2021  PCP: Carylon Perches, MD REFERRING PROVIDER: Council Mechanic, NP  END OF SESSION:   PT End of Session - 07/07/21 1641     Visit Number 9    Number of Visits 16    Date for PT Re-Evaluation 07/29/21    Authorization Type [employer]; 8 visits approved 3/29-->06/19/21; 6 visits approved 4/30-->07/20/21    Authorization Time Period 19/14/78; no cert required; pre-auth    Authorization - Visit Number 3    Authorization - Number of Visits 6    Progress Note Due on Visit 16    PT Start Time 1640    PT Stop Time 1720    PT Time Calculation (min) 40 min    Activity Tolerance Patient tolerated treatment well;Patient limited by pain;No increased pain    Behavior During Therapy WFL for tasks assessed/performed                  Past Medical History:  Diagnosis Date   AAA (abdominal aortic aneurysm) (HCC)    Anxiety    Arthritis    COPD (chronic obstructive pulmonary disease) (HCC)    Degenerative disorder of bone    Depression    Diverticulitis    Dyspnea    due to COPD and when walking long distances per patient   GERD (gastroesophageal reflux disease)    History of kidney stones    Hypertension    Kidney stone    Spinal stenosis    Past Surgical History:  Procedure Laterality Date   ABDOMINAL AORTIC ANEURYSM REPAIR N/A 01/16/2019   Procedure: OPEN REPAIR OF ABDOMINAL AORTIC ANEURYSM;  Surgeon: Larina Earthly, MD;  Location: [facility] OR;  Service: Vascular;  Laterality: N/A;   BIOPSY  01/14/2017   Procedure: BIOPSY;  Surgeon: West Bali, MD;  [facility];  Service: Endoscopy;;  gastric    CERVICAL SPINE SURGERY     C6-7 ACDF 09/22/09   COLONOSCOPY N/A 04/09/2014   Procedure: COLONOSCOPY;  Surgeon: Malissa Hippo, MD;  Location: [facility];  Service: Endoscopy;  Laterality: N/A;  730 - moved to 7:30 - Ann to  notify pt   CYSTOSCOPY     with stent   ESOPHAGOGASTRODUODENOSCOPY N/A 01/14/2017   Procedure: ESOPHAGOGASTRODUODENOSCOPY (EGD);  Surgeon: West Bali, MD;  [facility]: [facility];  Service: Endoscopy;  Laterality: N/A;   kidney stent     Patient Active Problem List   Diagnosis Date Noted   Low back pain 08/21/2019   AAA (abdominal aortic aneurysm) (HCC) 01/16/2019   UGIB (upper gastrointestinal bleed)    Acute blood loss anemia 01/13/2017   Melena 01/13/2017   Precordial chest pain 01/13/2017   Iliac artery aneurysm (HCC) 07/11/2014   Tobacco abuse 07/11/2014   History of colonic polyps 03/18/2014   Abdominal aortic aneurysm (HCC) 03/18/2014   Essential hypertension 03/18/2014    REFERRING DIAG: low back pain, Bertolotti's Syndrome-  THERAPY DIAG:  Chronic low back pain, unspecified back pain laterality, unspecified whether sciatica present  Bertolotti's syndrome   PRECAUTIONS: fall  SUBJECTIVE: Patient reports minimal back pain today 0-1.  He almost had an accident on a tractor earlier today; got his leg hung under the brake; reports some knee discomfort afterwards.    PAIN:  Are you having pain? Yes: NPRS scale: 0-1/10 Pain location: low back Pain description: constant aching  Aggravating factors: standing, walking Relieving factors: sit, rest      OBJECTIVE: (objective measures completed at initial evaluation unless otherwise dated)      PALPATION: Tender L4-L5 paraspinals; top of sacral area   LUMBAR ROM:    Active  AROM  05/19/2021 AROM 06/16/21  Flexion 85% available wfl  Extension 30% available 50% available no pain today  Right lateral flexion 50% available   Left lateral flexion 50% available   Right rotation 30% available   Left rotation 30% available    (Blank rows = not tested)     LE MMT:   MMT Right 05/19/2021 Left 05/19/2021 Left 06/16/21  Hip flexion 4+/5 4/5   Knee flexion 4+/5 3-/5 3+/5  Knee extension 4+/5 4-/5 4/5  Ankle  dorsiflexion 4+/5 4+/5    (Blank rows = not tested)     FUNCTIONAL TESTS:  5 times sit to stand: 23 sec   GAIT: Distance walked: 65 Assistive device utilized: None Level of assistance: SBA Comments: antalgic gait, decreased stance Left lower extremity       TODAY'S TREATMENT  07/07/21 Prone on elbows x 2' anterior mobilization x 10 reps L4-L5-S1 with slight superior direction  Supine: Supine on heat during exercises: Bridge  Bent knee raise with ab set 10  Isometric abdominal bracing 10 x 5"  Ambulation around the gym 286 ft in 2 min; x 310 ft total   07/01/21: Prone: POE x 2   Press up 5x 10"    Prone hip extension  Supine on heat during exercises: Bridge  Bent knee raise with ab set 10x 5" Isometric abdominal bracing 120x 5"  Laron Boorman Lymph, PT complete anterior mobilization x 10 reps L4-L5-S1 with slight superior direction  06/29/21 Supine on heat with legs elevated x 5' before start of exercise Supine: LTR 15 x 10" Supine single knee to chest using strap 5 x 20" each leg Bridge x 10 Abdominal  bracing 5" x 10 Abdominal bracing with marching x 10   Prone lying x 5';  anterior mobilization x 10 reps L4-L5-S1 with slight superior direction  FOTO 52    06/16/21 Supine on heat with legs elevated x 5' before start of exercise   Supine: LTR 15 x 10" Supine single knee to chest using strap 5 x 20" each leg Bridge x 10 Abdominal  bracing 5" x 10 Abdominal bracing with marching x 10   Prone lying x 5';  anterior mobilization x 10 reps L4-L5-S1 with slight superior direction     06/14/21 Supine on heat with legs elevated x 5' before start of exercise  Supine: Supine single knee to chest using strap 5 x 20" each leg LTR 15 x 10" Bridge x 10 Abdominal  bracing 5" x 10 Abdominal bracing with marching x 10   Prone lying x 5'; trial of anterior mobilization x 10 reps L4-L5-S1 with slight superior direction            PATIENT EDUCATION:  Education  details: 07/01/21: Educated scar tissue massage for self care  and benefits of massage therapy with list of local therapists.  06/29/21: plan of care; discussed benefit of massage and pool exercise   eval: good sitting posture, HEP and plan of care Person educated: Patient Education method: Explanation, Demonstration, Tactile cues, Verbal cues, and Handouts Education comprehension: verbalized understanding, returned demonstration, verbal cues required, tactile cues required, and needs further education     HOME EXERCISE PROGRAM: 06/02/21: Bridge and clam  Access Code:  [email or web address] Date: 05/26/2021 clams, hip add iso with ball  Access Code: [email or web address]   Date: 05/19/2021 trunk rotations, abd bracing, single knee to chest stretch    ASSESSMENT:   CLINICAL IMPRESSION: Session continued to focus on core stabilization. Patient pain levels much more controlled now.  Did a 2 MWT and patient has significantly improved his ability to walk with improved gait speed, distance tolerance and more upright posturing noted. Met walking goals today. Discussed with him possible early discharge if he continues with low pain levels and improved functional mobility and he is agreeable.    OBJECTIVE IMPAIRMENTS Abnormal gait, decreased activity tolerance, decreased balance, decreased coordination, decreased endurance, decreased knowledge of condition, decreased knowledge of use of DME, decreased mobility, difficulty walking, decreased ROM, decreased strength, decreased safety awareness, hypomobility, impaired perceived functional ability, increased muscle spasms, impaired flexibility, improper body mechanics, postural dysfunction, and pain.    ACTIVITY LIMITATIONS cleaning, community activity, driving, meal prep, occupation, laundry, yard work, shopping, and yard work.    PERSONAL FACTORS Age and Fitness are also affecting patient's functional outcome.      REHAB POTENTIAL: Good   CLINICAL DECISION MAKING:  Stable/uncomplicated   EVALUATION COMPLEXITY: Low     GOALS: Goals reviewed with patient? Yes   SHORT TERM GOALS: Target date: 06/02/2021   Patient with be independent with initial HEP to  improve functional outcomes   Goal status: met   2.  Patient will ambulate x 100 ft without pain stopping him during walk without AD to improve ability to navigate household and home distances. Baseline: 65 ft Goal status: met   LONG TERM GOALS: Target date: 5/292023   Patient will be independent in self management strategies to improve quality of life and functional outcomes.  Goal status: ongoing    2.   Patient will meet predicted FOTO score to demonstrate improved overall function Baseline: 39; 06/29/21 52 Goal status: met   3.  Patient will report at least 50% improvement in overall symptoms and/or function to demonstrate improved functional mobility     Goal status: ongoing    4.  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   Baseline: 65 Goal status: met       PLAN: PT FREQUENCY: 2x/week   PT DURATION: 4 weeks   PLANNED INTERVENTIONS: Therapeutic exercises, Therapeutic activity, Neuromuscular re-education, Balance training, Gait training, Patient/Family education, Joint manipulation, Joint mobilization, Stair training, Vestibular training, Canalith repositioning, DME instructions, Aquatic Therapy, Dry Needling, Electrical stimulation, Spinal manipulation, Spinal mobilization, Cryotherapy, Moist heat, Compression bandaging, Taping, Traction, Ultrasound, Biofeedback, and Manual therapy.   PLAN FOR NEXT SESSION:  progress lumbar stabilization and mobility as able.  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    5:20 PM, 07/07/21 Roland Lipke Small Kendan Cornforth MPT [facility] [place] [phone]

## 2021-07-13 ENCOUNTER — Ambulatory Visit (HOSPITAL_COMMUNITY): Payer: Medicare HMO

## 2021-07-13 DIAGNOSIS — Q7649 Other congenital malformations of spine, not associated with scoliosis: Secondary | ICD-10-CM | POA: Diagnosis not present

## 2021-07-13 DIAGNOSIS — G8929 Other chronic pain: Secondary | ICD-10-CM | POA: Diagnosis not present

## 2021-07-13 DIAGNOSIS — M545 Low back pain, unspecified: Secondary | ICD-10-CM

## 2021-07-13 NOTE — Therapy (Signed)
OUTPATIENT PHYSICAL THERAPY DISCHARGE NOTE  PHYSICAL THERAPY DISCHARGE SUMMARY  Visits from Start of Care: 10  Current functional level related to goals / functional outcomes: See below   Remaining deficits: See below   Education / Equipment: See below   Patient agrees to discharge. Patient goals were met. Patient is being discharged due to meeting the stated rehab goals.    Patient Name: Jeremy Mclean MRN: [id number] DOB:18-May-1950, 71 y.o., male Today's Date: 07/13/2021  PCP: Asencion Noble, MD REFERRING PROVIDER: Marvis Moeller, NP  END OF SESSION:   PT End of Session - 07/13/21 1410     Visit Number 10    Number of Visits 16    Date for PT Re-Evaluation 07/29/21    Authorization Type [employer]; 8 visits approved 3/29-->06/19/21; 6 visits approved 4/30-->07/20/21    Authorization Time Period 33/29/51; no cert required; pre-auth    Authorization - Visit Number 4    Authorization - Number of Visits 6    Progress Note Due on Visit 16    PT Start Time 0151    PT Stop Time 0230    PT Time Calculation (min) 39 min    Activity Tolerance Patient tolerated treatment well;Patient limited by pain;No increased pain    Behavior During Therapy WFL for tasks assessed/performed                   Past Medical History:  Diagnosis Date   AAA (abdominal aortic aneurysm) (HCC)    Anxiety    Arthritis    COPD (chronic obstructive pulmonary disease) (HCC)    Degenerative disorder of bone    Depression    Diverticulitis    Dyspnea    due to COPD and when walking long distances per patient   GERD (gastroesophageal reflux disease)    History of kidney stones    Hypertension    Kidney stone    Spinal stenosis    Past Surgical History:  Procedure Laterality Date   ABDOMINAL AORTIC ANEURYSM REPAIR N/A 01/16/2019   Procedure: OPEN REPAIR OF ABDOMINAL AORTIC ANEURYSM;  Surgeon: Rosetta Posner, MD;  Location: [facility];  Service: Vascular;  Laterality: N/A;   BIOPSY   01/14/2017   Procedure: BIOPSY;  Surgeon: Danie Binder, MD;  [facility];  Service: Endoscopy;;  gastric    CERVICAL SPINE SURGERY     C6-7 ACDF 09/22/09   COLONOSCOPY N/A 04/09/2014   Procedure: COLONOSCOPY;  Surgeon: Rogene Houston, MD;  [facility];  Service: Endoscopy;  Laterality: N/A;  730 - moved to 7:30 - Ann to notify pt   CYSTOSCOPY     with stent   ESOPHAGOGASTRODUODENOSCOPY N/A 01/14/2017   Procedure: ESOPHAGOGASTRODUODENOSCOPY (EGD);  Surgeon: Danie Binder, MD;  [facility];  Service: Endoscopy;  Laterality: N/A;   kidney stent     Patient Active Problem List   Diagnosis Date Noted   Low back pain 08/21/2019   AAA (abdominal aortic aneurysm) ([place]) 01/16/2019   UGIB (upper gastrointestinal bleed)    Acute blood loss anemia 01/13/2017   Melena 01/13/2017   Precordial chest pain 01/13/2017   Iliac artery aneurysm ([place]) 07/11/2014   Tobacco abuse 07/11/2014   History of colonic polyps 03/18/2014   Abdominal aortic aneurysm ([place]) 03/18/2014   Essential hypertension 03/18/2014    REFERRING DIAG: low back pain, Bertolotti's Syndrome-  THERAPY DIAG:  No diagnosis found.   PRECAUTIONS: fall  SUBJECTIVE: Patient reports minimal back  pain today 0-1.  He almost had an accident on a tractor earlier today; got his leg hung under the brake; reports some knee discomfort afterwards.    PAIN:  Are you having pain? Yes: NPRS scale: 0-1/10 Pain location: low back Pain description: constant aching  Aggravating factors: standing, walking Relieving factors: sit, rest      OBJECTIVE: (objective measures completed at initial evaluation unless otherwise dated)      PALPATION: Tender L4-L5 paraspinals; top of sacral area   LUMBAR ROM:    Active  AROM  05/19/2021 AROM 06/16/21  Flexion 85% available wfl  Extension 30% available 60% available no pain today  Right lateral flexion 50% available 75% available  Left lateral flexion  50% available 75% available  Right rotation 30% available wfl  Left rotation 30% available wfl   (Blank rows = not tested)     LE MMT:   MMT Right 05/19/2021 Left 05/19/2021 Left 06/16/21 Left 07/13/21  Hip flexion 4+/5 4/5    Knee flexion 4+/5 3-/5 3+/5 4+/5  Knee extension 4+/5 4-/5 4/5 4+/5  Ankle dorsiflexion 4+/5 4+/5     (Blank rows = not tested)     FUNCTIONAL TESTS:  5 times sit to stand: 23 sec   GAIT: Distance walked: 65 Assistive device utilized: None Level of assistance: SBA Comments: antalgic gait, decreased stance Left lower extremity       TODAY'S TREATMENT  07/13/21 Discharge visit Review of HEP anterior mobilization x 10 reps L4-L5-S1 with slight superior direction 07/07/21 Prone on elbows x 2' anterior mobilization x 10 reps L4-L5-S1 with slight superior direction  Supine: Supine on heat during exercises: Bridge  Bent knee raise with ab set 10  Isometric abdominal bracing 10 x 5"  Ambulation around the gym 286 ft in 2 min; x 310 ft total   07/01/21: Prone: POE x 2   Press up 5x 10"    Prone hip extension  Supine on heat during exercises: Bridge  Bent knee raise with ab set 10x 5" Isometric abdominal bracing 120x 5"  Taneshia Lorence Lymph, PT complete anterior mobilization x 10 reps L4-L5-S1 with slight superior direction  06/29/21 Supine on heat with legs elevated x 5' before start of exercise Supine: LTR 15 x 10" Supine single knee to chest using strap 5 x 20" each leg Bridge x 10 Abdominal  bracing 5" x 10 Abdominal bracing with marching x 10   Prone lying x 5';  anterior mobilization x 10 reps L4-L5-S1 with slight superior direction  FOTO 52    06/16/21 Supine on heat with legs elevated x 5' before start of exercise   Supine: LTR 15 x 10" Supine single knee to chest using strap 5 x 20" each leg Bridge x 10 Abdominal  bracing 5" x 10 Abdominal bracing with marching x 10   Prone lying x 5';  anterior mobilization x 10 reps L4-L5-S1  with slight superior direction     06/14/21 Supine on heat with legs elevated x 5' before start of exercise  Supine: Supine single knee to chest using strap 5 x 20" each leg LTR 15 x 10" Bridge x 10 Abdominal  bracing 5" x 10 Abdominal bracing with marching x 10   Prone lying x 5'; trial of anterior mobilization x 10 reps L4-L5-S1 with slight superior direction            PATIENT EDUCATION:  Education details: 07/01/21: Educated scar tissue massage for self care  and benefits of massage  therapy with list of local therapists.  06/29/21: plan of care; discussed benefit of massage and pool exercise   eval: good sitting posture, HEP and plan of care Person educated: Patient Education method: Explanation, Demonstration, Tactile cues, Verbal cues, and Handouts Education comprehension: verbalized understanding, returned demonstration, verbal cues required, tactile cues required, and needs further education     HOME EXERCISE PROGRAM: 06/02/21: Bridge and clam  Access Code: [email or web address] Date: 05/26/2021 clams, hip add iso with ball  Access Code: [email or web address]   Date: 05/19/2021 trunk rotations, abd bracing, single knee to chest stretch    ASSESSMENT:   CLINICAL IMPRESSION: Patient is still pain free with his back; he has at this point achieved all set rehab goals and is agreeable to discharge.    OBJECTIVE IMPAIRMENTS Abnormal gait, decreased activity tolerance, decreased balance, decreased coordination, decreased endurance, decreased knowledge of condition, decreased knowledge of use of DME, decreased mobility, difficulty walking, decreased ROM, decreased strength, decreased safety awareness, hypomobility, impaired perceived functional ability, increased muscle spasms, impaired flexibility, improper body mechanics, postural dysfunction, and pain.    ACTIVITY LIMITATIONS cleaning, community activity, driving, meal prep, occupation, laundry, yard work, shopping, and yard work.     PERSONAL FACTORS Age and Fitness are also affecting patient's functional outcome.      REHAB POTENTIAL: Good   CLINICAL DECISION MAKING: Stable/uncomplicated   EVALUATION COMPLEXITY: Low     GOALS: Goals reviewed with patient? Yes   SHORT TERM GOALS: Target date: 06/02/2021   Patient with be independent with initial HEP to  improve functional outcomes   Goal status: met   2.  Patient will ambulate x 100 ft without pain stopping him during walk without AD to improve ability to navigate household and home distances. Baseline: 65 ft Goal status: met   LONG TERM GOALS: Target date: 5/292023   Patient will be independent in self management strategies to improve quality of life and functional outcomes.  Goal status: met   2.   Patient will meet predicted FOTO score to demonstrate improved overall function Baseline: 39; 06/29/21 52 Goal status: met   3.  Patient will report at least 50% improvement in overall symptoms and/or function to demonstrate improved functional mobility     Goal status: met   4.  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   Baseline: 65 Goal status: met       PLAN: PT FREQUENCY: 2x/week   PT DURATION: 4 weeks   PLANNED INTERVENTIONS: Therapeutic exercises, Therapeutic activity, Neuromuscular re-education, Balance training, Gait training, Patient/Family education, Joint manipulation, Joint mobilization, Stair training, Vestibular training, Canalith repositioning, DME instructions, Aquatic Therapy, Dry Needling, Electrical stimulation, Spinal manipulation, Spinal mobilization, Cryotherapy, Moist heat, Compression bandaging, Taping, Traction, Ultrasound, Biofeedback, and Manual therapy.   PLAN FOR NEXT SESSION:  discharge   2:12 PM, 07/13/21 Lateesha Bezold Small Tascha Casares MPT [facility] [place] [phone]

## 2021-07-15 ENCOUNTER — Encounter (HOSPITAL_COMMUNITY): Payer: Medicare HMO

## 2021-07-28 ENCOUNTER — Ambulatory Visit
Admission: EM | Admit: 2021-07-28 | Discharge: 2021-07-28 | Disposition: A | Discharge status: Home / Self Care | Payer: Medicare HMO | Attending: Nurse Practitioner | Admitting: Nurse Practitioner

## 2021-07-28 ENCOUNTER — Ambulatory Visit (INDEPENDENT_AMBULATORY_CARE_PROVIDER_SITE_OTHER): Payer: Medicare HMO

## 2021-07-28 DIAGNOSIS — M25561 Pain in right knee: Secondary | ICD-10-CM

## 2021-07-28 MED ORDER — DICLOFENAC SODIUM 1 % EX GEL
2.0000 g | Freq: Four times a day (QID) | CUTANEOUS | 0 refills | Status: AC
Start: 2021-07-28 — End: ?

## 2021-07-28 NOTE — ED Triage Notes (Signed)
Pt states that he was getting up on a tractor and almost fell and his right leg was hung up about 3 weeks ago  Pt states that his right knee has been in pain and swollen for the past 3 weeks  Pt states it feels like there is fluid on it

## 2021-07-28 NOTE — Discharge Instructions (Addendum)
-   Knee x-ray today does not show any acute bone fracture or significant fluid build up - There is some arthritis in the knee - Please start Tylenol 500 mg every 6 hours and start using the diclofenac gel I sent into the pharmacy - If the knee pain persists or worsens, please follow up with your primary care provider or an orthopedic provider; I have attached the contact information below

## 2021-07-28 NOTE — ED Provider Notes (Signed)
[facility]    CSN: [id number] Arrival date & time: 07/28/21  1248      History   Chief Complaint Chief Complaint  Patient presents with   Knee Pain    Left knee pain    HPI Jeremy Mclean is a 71 y.o. male.   Patient presents with wife for right knee pain that has been ongoing for the past couple of weeks.  Reports a couple weeks ago, he was driving his tractor and his leg got hung up in the brake and he wasn't able to move it for a few minutes.  Denies any twisting, popping, or locking of the knee during the injury, however the pain did start after this.  He describes the pain as a 8/10 and reports it is sharp.  It is worse with movement and with walking.  He denies radiation down to his ankle, numbness or tinging in his toes.  Denies any skin color changes, redness or bruising after the injury.  Does feel that there is fluid on the knee.  He has not tried anything for his symptoms, although is reportedly allergic to prednisone.  Denies weakness, sensation of giving way, locking, popping, fevers, and numbness or tingling.   Medical history significant for upper GI bleed, abdominal aortic aneurysm repair, tobacco abuse.    Past Medical History:  Diagnosis Date   AAA (abdominal aortic aneurysm) (HCC)    Anxiety    Arthritis    COPD (chronic obstructive pulmonary disease) (HCC)    Degenerative disorder of bone    Depression    Diverticulitis    Dyspnea    due to COPD and when walking long distances per patient   GERD (gastroesophageal reflux disease)    History of kidney stones    Hypertension    Kidney stone    Spinal stenosis     Patient Active Problem List   Diagnosis Date Noted   Low back pain 08/21/2019   AAA (abdominal aortic aneurysm) ([place]) 01/16/2019   UGIB (upper gastrointestinal bleed)    Acute blood loss anemia 01/13/2017   Melena 01/13/2017   Precordial chest pain 01/13/2017   Iliac artery aneurysm ([place]) 07/11/2014   Tobacco abuse 07/11/2014    History of colonic polyps 03/18/2014   Abdominal aortic aneurysm ([place]) 03/18/2014   Essential hypertension 03/18/2014    Past Surgical History:  Procedure Laterality Date   ABDOMINAL AORTIC ANEURYSM REPAIR N/A 01/16/2019   Procedure: OPEN REPAIR OF ABDOMINAL AORTIC ANEURYSM;  Surgeon: Rosetta Posner, MD;  Location: [place];  Service: Vascular;  Laterality: N/A;   BIOPSY  01/14/2017   Procedure: BIOPSY;  Surgeon: Danie Binder, MD;  [facility];  Service: Endoscopy;;  gastric    CERVICAL SPINE SURGERY     C6-7 ACDF 09/22/09   COLONOSCOPY N/A 04/09/2014   Procedure: COLONOSCOPY;  Surgeon: Rogene Houston, MD;  [facility];  Service: Endoscopy;  Laterality: N/A;  730 - moved to 7:30 - Ann to notify pt   CYSTOSCOPY     with stent   ESOPHAGOGASTRODUODENOSCOPY N/A 01/14/2017   Procedure: ESOPHAGOGASTRODUODENOSCOPY (EGD);  Surgeon: Danie Binder, MD;  [facility];  Service: Endoscopy;  Laterality: N/A;   kidney stent         Home Medications    Prior to Admission medications   Medication Sig Start Date End Date Taking? Authorizing Provider  albuterol (PROVENTIL HFA;VENTOLIN HFA) 108 (90 Base) MCG/ACT inhaler Inhale 1-2 puffs  into the lungs every 6 (six) hours as needed for wheezing or shortness of breath.    [provider]  amLODipine (NORVASC) 10 MG tablet Take 10 mg by mouth every evening.  10/21/17   [provider]  BREO ELLIPTA 200-25 MCG/INH AEPB Inhale 1 puff into the lungs every evening.  11/29/16   [provider]  diazepam (VALIUM) 5 MG tablet Take 5 mg by mouth at bedtime.     [provider]  diclofenac Sodium (VOLTAREN) 1 % GEL Apply 2 g topically 4 (four) times daily. 07/28/21  Yes Eulogio Bear, NP  diphenhydrAMINE (BENADRYL) 25 MG tablet Take 25 mg by mouth at bedtime. With Diazepam Patient not taking: Reported on 08/21/2019    [provider]  diphenhydrAMINE (BENADRYL) 50 MG capsule  Take 50 mg by mouth 1 hour prior to your procedure. Patient not taking: Reported on 05/14/2019 11/13/18   Rosetta Posner, MD  ezetimibe (ZETIA) 10 MG tablet  03/04/19   [provider]  HYDROcodone-acetaminophen (NORCO/VICODIN) 5-325 MG tablet Take 1 tablet by mouth 3 (three) times daily as needed for severe pain. 03/16/21   Carmin Muskrat, MD  lisinopril (PRINIVIL,ZESTRIL) 40 MG tablet Take 40 mg by mouth every evening.  10/21/17   [provider]  pantoprazole (PROTONIX) 40 MG tablet Take 1 tablet (40 mg total) by mouth 2 (two) times daily before a meal. Patient not taking: Reported on 05/14/2019 01/15/17   Tat, Shanon Brow, MD  polyethylene glycol-electrolytes (TRILYTE) 420 g solution Take 4,000 mLs by mouth as directed. 07/14/20   [name], Mechele Dawley, MD  traMADol (ULTRAM) 50 MG tablet Take 50 mg by mouth 3 (three) times daily.     [provider]    Family History Family History  Problem Relation Age of Onset   Diabetes Mother    Cancer Father    Cancer Other    Diabetes Other     Social History Social History   Tobacco Use   Smoking status: Every Day    Packs/day: 1.00    Years: 40.00    Pack years: 40.00    Types: Cigarettes   Smokeless tobacco: Current    Types: Chew  Vaping Use   Vaping Use: Never used  Substance Use Topics   Alcohol use: No    Alcohol/week: 0.0 standard drinks   Drug use: No     Allergies   Contrast media [iodinated contrast media], Iohexol, and Prednisone   Review of Systems Review of Systems Per HPI  Physical Exam Triage Vital Signs ED Triage Vitals  Enc Vitals Group     BP 07/28/21 1253 (!) 152/85     Pulse Rate 07/28/21 1253 68     Resp 07/28/21 1253 16     Temp 07/28/21 1253 98 F (36.7 C)     Temp Source 07/28/21 1253 Oral     SpO2 07/28/21 1253 96 %     Weight --      Height --      Head Circumference --      Peak Flow --      Pain Score 07/28/21 1256 8     Pain Loc --      Pain Edu? --      Excl. in [place]?  --    No data found.  Updated Vital Signs BP (!) 152/85 (BP Location: Right Arm)   Pulse 68   Temp 98 F (36.7 C) (Oral)   Resp 16  SpO2 96%   Visual Acuity Right Eye Distance:   Left Eye Distance:   Bilateral Distance:    Right Eye Near:   Left Eye Near:    Bilateral Near:     Physical Exam Vitals and nursing note reviewed.  Constitutional:      General: He is not in acute distress.    Appearance: Normal appearance. He is obese. He is not toxic-appearing.  Musculoskeletal:        General: Normal range of motion.     Right knee: Effusion and bony tenderness present. No swelling, deformity, erythema or ecchymosis. Normal range of motion. Normal alignment.     Left knee: Normal.     Right lower leg: 2+ Edema present.     Left lower leg: 2+ Edema present.     Right ankle: Normal pulse.     Left ankle: Normal pulse.     Comments: Inspection: No obvious swelling, deformity or redness to bilateral knees Palpation: right patella slightly tender to palpation, joint lines are nontender.  Small effusion appreciated.  Left patellea is nontender to palpation, joints lines are nontender.  No effusion on left side.  No laxity appreciated bilaterally. ROM: Full passive ROM to bilateral knees Strength: 5/5 bilaterally Neurovascular: neurovascularly intact in left and right lower extremity   Skin:    General: Skin is warm and dry.     Capillary Refill: Capillary refill takes less than 2 seconds.     Coloration: Skin is not jaundiced or pale.     Findings: No erythema.  Neurological:     Mental Status: He is alert and oriented to person, place, and time.     Motor: No weakness.     Gait: Gait normal.  Psychiatric:        Behavior: Behavior is cooperative.     UC Treatments / Results  Labs (all labs ordered are listed, but only abnormal results are displayed) Labs Reviewed - No data to display  EKG   Radiology DG Knee Complete 4 Views Right  Result Date:  07/28/2021 CLINICAL DATA:  Right knee pain after tractor injury 2 weeks ago. EXAM: RIGHT KNEE - COMPLETE 4+ VIEW COMPARISON:  None Available. FINDINGS: No joint effusion identified. No acute fracture or dislocation. Sharpening of the tibial spines. Medial compartment marginal spur formation with mild joint space narrowing. Vascular calcifications identified. IMPRESSION: 1. No acute findings. 2. Mild osteoarthritis. Electronically Signed   By: Kerby Moors M.D.   On: 07/28/2021 13:30    Procedures Procedures (including critical care time)  Medications Ordered in UC Medications - No data to display  Initial Impression / Assessment and Plan / UC Course  I have reviewed the triage vital signs and the nursing notes.  Pertinent labs & imaging results that were available during my care of the patient were reviewed by me and considered in my medical decision making (see chart for details).    Knee x-ray today shows mild osteoarthritis.  Start Tylenol 500 mg every 6 hours as needed for pain; can also use diclofenac topical gel for knee pain.  Follow up with primary care provider or orthopedic provider if pain persists; contact information given for Orthopedic provider.  Final Clinical Impressions(s) / UC Diagnoses   Final diagnoses:  Acute pain of right knee     Discharge Instructions      - Knee x-ray today does not show any acute bone fracture or significant fluid build up - There is some arthritis in the  knee - Please start Tylenol 500 mg every 6 hours and start using the diclofenac gel I sent into the pharmacy - If the knee pain persists or worsens, please follow up with your primary care provider or an orthopedic provider; I have attached the contact information below     ED Prescriptions     Medication Sig Dispense Auth. Provider   diclofenac Sodium (VOLTAREN) 1 % GEL Apply 2 g topically 4 (four) times daily. 50 g Eulogio Bear, NP      PDMP not reviewed this encounter.    Eulogio Bear, NP 07/28/21 1344

## 2021-08-02 DIAGNOSIS — M1711 Unilateral primary osteoarthritis, right knee: Secondary | ICD-10-CM | POA: Diagnosis not present

## 2021-08-11 DIAGNOSIS — M1711 Unilateral primary osteoarthritis, right knee: Secondary | ICD-10-CM | POA: Diagnosis not present

## 2021-08-20 DIAGNOSIS — I10 Essential (primary) hypertension: Secondary | ICD-10-CM | POA: Diagnosis not present

## 2021-08-20 DIAGNOSIS — K219 Gastro-esophageal reflux disease without esophagitis: Secondary | ICD-10-CM | POA: Diagnosis not present

## 2021-08-20 DIAGNOSIS — E785 Hyperlipidemia, unspecified: Secondary | ICD-10-CM | POA: Diagnosis not present

## 2021-09-01 ENCOUNTER — Other Ambulatory Visit: Payer: Self-pay

## 2021-09-01 ENCOUNTER — Ambulatory Visit: Payer: Medicare HMO | Admitting: Orthopedic Surgery

## 2021-09-01 ENCOUNTER — Encounter: Payer: Self-pay | Admitting: Orthopedic Surgery

## 2021-09-01 ENCOUNTER — Telehealth: Payer: Self-pay | Admitting: Orthopedic Surgery

## 2021-09-01 VITALS — BP 170/98 | HR 67 | Ht 70.0 in

## 2021-09-01 DIAGNOSIS — S82141A Displaced bicondylar fracture of right tibia, initial encounter for closed fracture: Secondary | ICD-10-CM | POA: Diagnosis not present

## 2021-09-01 DIAGNOSIS — M25561 Pain in right knee: Secondary | ICD-10-CM | POA: Diagnosis not present

## 2021-09-01 MED ORDER — HYDROCODONE-ACETAMINOPHEN 10-325 MG PO TABS
1.0000 | ORAL_TABLET | Freq: Four times a day (QID) | ORAL | 0 refills | Status: DC | PRN
Start: 2021-09-01 — End: 2021-09-23

## 2021-09-01 NOTE — Progress Notes (Addendum)
Chief Complaint  Patient presents with   Knee Pain    Right, got hung on tractor 2-3 months ago started hurting 2-3 days after that, started swelling, tried ice and OTC meds, no relief, aspirated his own knee got 12cc   Past Medical History:  Diagnosis Date   AAA (abdominal aortic aneurysm) ([place])    Anxiety    Arthritis    COPD (chronic obstructive pulmonary disease) ([place])    Degenerative disorder of bone    Depression    Diverticulitis    Dyspnea    due to COPD and when walking long distances per patient   GERD (gastroesophageal reflux disease)    History of kidney stones    Hypertension    Kidney stone    Spinal stenosis    71 year old male was on a lawn more he got tangled up and fell backwards about 3 days later his knee started hurting.  This was over a month ago.  An x-ray was done.  He aspirated his own knee he showed me a picture of 12 cc of fluid that he obtained it was clear yellow fluid  He comes in today and barely able able to walk  He also has a left upper extremity and left lower extremity weakness from a previous stroke  His exam shows a joint effusion tenderness over the medial plateau severe medial joint line severe and then diffuse tenderness about the knee fully bend or straighten it feels stable on the anterior posterior drawer test and the collateral ligaments feel stable to McMurray's sign seems positive  Either has a fracture of his plateau or perhaps torn meniscus  I read his mri it shows OA   Encounter Diagnosis  Name Primary?   Closed fracture of right tibial plateau, initial encounter Yes     He will need an MRI He can have a brace [place] hinged for stability.  He is encouraged to use his walker Meds ordered this encounter  Medications   HYDROcodone-acetaminophen (NORCO) 10-325 MG tablet    Sig: Take 1 tablet by mouth every 6 (six) hours as needed.    Dispense:  30 tablet    Refill:  0   Fu after mri

## 2021-09-01 NOTE — Telephone Encounter (Signed)
Pharmacist  from [facility] left a voice message at 10:55 regarding patient's prescription per visit w/Dr Aline Brochure today, 09/01/21; relays that patient received a Tramadol prescription from another provider also.

## 2021-09-01 NOTE — Telephone Encounter (Signed)
They sent a fax this afternoon, I have advised via fax ok to fill Hydrocodone, patient has severe pain s/p injury/ tramadol was in list so Dr Lemmie Evens knew he was on it but clearly it wasn't helping.   No action needed.

## 2021-09-06 ENCOUNTER — Ambulatory Visit: Payer: Medicare HMO | Admitting: Orthopedic Surgery

## 2021-09-06 DIAGNOSIS — E785 Hyperlipidemia, unspecified: Secondary | ICD-10-CM | POA: Diagnosis not present

## 2021-09-06 DIAGNOSIS — J449 Chronic obstructive pulmonary disease, unspecified: Secondary | ICD-10-CM | POA: Diagnosis not present

## 2021-09-06 DIAGNOSIS — K219 Gastro-esophageal reflux disease without esophagitis: Secondary | ICD-10-CM | POA: Diagnosis not present

## 2021-09-06 DIAGNOSIS — Z125 Encounter for screening for malignant neoplasm of prostate: Secondary | ICD-10-CM | POA: Diagnosis not present

## 2021-09-06 DIAGNOSIS — M48062 Spinal stenosis, lumbar region with neurogenic claudication: Secondary | ICD-10-CM | POA: Diagnosis not present

## 2021-09-06 DIAGNOSIS — Z79899 Other long term (current) drug therapy: Secondary | ICD-10-CM | POA: Diagnosis not present

## 2021-09-06 DIAGNOSIS — F419 Anxiety disorder, unspecified: Secondary | ICD-10-CM | POA: Diagnosis not present

## 2021-09-13 DIAGNOSIS — Z6826 Body mass index (BMI) 26.0-26.9, adult: Secondary | ICD-10-CM | POA: Diagnosis not present

## 2021-09-13 DIAGNOSIS — E785 Hyperlipidemia, unspecified: Secondary | ICD-10-CM | POA: Diagnosis not present

## 2021-09-13 DIAGNOSIS — I714 Abdominal aortic aneurysm, without rupture, unspecified: Secondary | ICD-10-CM | POA: Diagnosis not present

## 2021-09-13 DIAGNOSIS — I10 Essential (primary) hypertension: Secondary | ICD-10-CM | POA: Diagnosis not present

## 2021-09-13 DIAGNOSIS — J449 Chronic obstructive pulmonary disease, unspecified: Secondary | ICD-10-CM | POA: Diagnosis not present

## 2021-09-13 DIAGNOSIS — K219 Gastro-esophageal reflux disease without esophagitis: Secondary | ICD-10-CM | POA: Diagnosis not present

## 2021-09-21 ENCOUNTER — Ambulatory Visit (HOSPITAL_COMMUNITY)
Admission: RE | Admit: 2021-09-21 | Discharge: 2021-09-21 | Disposition: A | Discharge status: Home / Self Care | Payer: Medicare HMO | Source: Ambulatory Visit | Attending: Orthopedic Surgery | Admitting: Orthopedic Surgery

## 2021-09-21 DIAGNOSIS — S83241A Other tear of medial meniscus, current injury, right knee, initial encounter: Secondary | ICD-10-CM | POA: Diagnosis not present

## 2021-09-21 DIAGNOSIS — M7989 Other specified soft tissue disorders: Secondary | ICD-10-CM | POA: Diagnosis not present

## 2021-09-21 DIAGNOSIS — S82141A Displaced bicondylar fracture of right tibia, initial encounter for closed fracture: Secondary | ICD-10-CM | POA: Insufficient documentation

## 2021-09-21 DIAGNOSIS — M1711 Unilateral primary osteoarthritis, right knee: Secondary | ICD-10-CM | POA: Diagnosis not present

## 2021-09-21 DIAGNOSIS — M25461 Effusion, right knee: Secondary | ICD-10-CM | POA: Diagnosis not present

## 2021-09-23 ENCOUNTER — Ambulatory Visit: Payer: Medicare HMO | Admitting: Orthopedic Surgery

## 2021-09-23 DIAGNOSIS — S82141D Displaced bicondylar fracture of right tibia, subsequent encounter for closed fracture with routine healing: Secondary | ICD-10-CM | POA: Diagnosis not present

## 2021-09-23 MED ORDER — HYDROCODONE-ACETAMINOPHEN 10-325 MG PO TABS: 1.0000 | ORAL_TABLET | Freq: Four times a day (QID) | ORAL | 0 refills | Status: AC | PRN

## 2021-09-23 NOTE — Patient Instructions (Addendum)
Wear your brace at all times while walking  Use a walker to take pressure off of your leg  Follow up in 4 weeks for a recheck with an xray

## 2021-09-23 NOTE — Progress Notes (Signed)
Chief Complaint  Patient presents with   Results    MRI review    71 year old male had a right knee that has a subchondral insufficiency fracture.  He is having severe pain in his knee is waking him up at night.  He is using a cane and a knee brace.  I reviewed his MRI in my opinion: He has a small nonsurgical tear of his medial meniscus but more importantly has a subchondral insufficiency fracture on the medial side of his right knee  He asked for stronger pain pills he is on hydrocodone already.  I asked him to take his medicine at night use a walker use of brace but cannot give him any more pain pills other than what he is getting  X-ray in 4 weeks emphasized protected weightbearing with walker and brace  Meds ordered this encounter  Medications   HYDROcodone-acetaminophen (NORCO) 10-325 MG tablet    Sig: Take 1 tablet by mouth every 6 (six) hours as needed for up to 5 days.    Dispense:  20 tablet    Refill:  0

## 2021-09-27 ENCOUNTER — Ambulatory Visit: Payer: Medicare HMO | Admitting: Orthopedic Surgery

## 2021-09-30 ENCOUNTER — Ambulatory Visit: Payer: Medicare HMO | Admitting: Orthopedic Surgery

## 2021-10-07 ENCOUNTER — Other Ambulatory Visit: Payer: Self-pay

## 2021-10-07 NOTE — Telephone Encounter (Signed)
Your note states He asked for stronger pain pills he is on hydrocodone already.  I asked him to take his medicine at night use a walker use of brace but cannot give him any more pain pills other than what he is getting  But you gave him Hydrocodone 10 / 325 / please advise

## 2021-10-07 NOTE — Telephone Encounter (Signed)
Hydrocodone-Acetaminophen 10/325 MG Qty 20 Tablets  Take 1 tablet by mouth every 6(six) hours as needed for up to 5 days.  PATIENT USES [facility] ON [address]

## 2021-10-08 MED ORDER — HYDROCODONE-ACETAMINOPHEN 10-325 MG PO TABS: 1.0000 | ORAL_TABLET | Freq: Four times a day (QID) | ORAL | 0 refills | Status: AC | PRN

## 2021-10-21 ENCOUNTER — Ambulatory Visit: Payer: Medicare HMO | Admitting: Orthopedic Surgery

## 2021-10-21 ENCOUNTER — Ambulatory Visit (INDEPENDENT_AMBULATORY_CARE_PROVIDER_SITE_OTHER): Payer: Medicare HMO

## 2021-10-21 DIAGNOSIS — S82141D Displaced bicondylar fracture of right tibia, subsequent encounter for closed fracture with routine healing: Secondary | ICD-10-CM

## 2021-10-21 DIAGNOSIS — E785 Hyperlipidemia, unspecified: Secondary | ICD-10-CM | POA: Diagnosis not present

## 2021-10-21 DIAGNOSIS — M1711 Unilateral primary osteoarthritis, right knee: Secondary | ICD-10-CM | POA: Diagnosis not present

## 2021-10-21 DIAGNOSIS — I10 Essential (primary) hypertension: Secondary | ICD-10-CM | POA: Diagnosis not present

## 2021-10-21 DIAGNOSIS — K219 Gastro-esophageal reflux disease without esophagitis: Secondary | ICD-10-CM | POA: Diagnosis not present

## 2021-10-21 NOTE — Progress Notes (Signed)
FOLLOW UP   Encounter Diagnosis  Name Primary?   Closed fracture of right tibial plateau with routine healing, subsequent encounter Yes     Chief Complaint  Patient presents with   Knee Injury    Right knee fracture ?date of injury     Subchondral insufficiency fracture right knee diagnosed with MRI treated with bracing and protected weightbearing  X-ray shows no collapse of the bone   He says he is improving and he can go without his walker we will transition him to a cane we will continue his economy hinged brace and I will see him again in 4 weeks

## 2021-11-18 ENCOUNTER — Ambulatory Visit: Payer: Medicare HMO | Admitting: Orthopedic Surgery

## 2021-11-18 DIAGNOSIS — S82141D Displaced bicondylar fracture of right tibia, subsequent encounter for closed fracture with routine healing: Secondary | ICD-10-CM

## 2021-11-18 NOTE — Patient Instructions (Signed)
Try walking without the brace    For pain take tylenol 500 mg every 6 hrs as needed for pain   Try liniment to rub on the knee

## 2021-11-18 NOTE — Progress Notes (Signed)
Chief Complaint  Patient presents with   Follow-up    Recheck on right knee   Jeremy Mclean was treated for a closed fracture of his right tibial plateau with a brace and protected weightbearing he is progressively increased his weightbearing he says the brace is starting to bother him he does have some moderate pain and popping in his right knee  He does have a history of a AAA COPD, hypertension spinal stenosis  He has had surgery to repair the AAA he had cervical spine surgery he has had some issues with kidney stones  He is not really a good surgical candidate.  Recommend he continue his cane use Tylenol 500 mg every 6 and liniment to control his knee pain and follow-up for x-ray in 6 months

## 2021-12-06 DIAGNOSIS — Q7649 Other congenital malformations of spine, not associated with scoliosis: Secondary | ICD-10-CM | POA: Diagnosis not present

## 2021-12-06 DIAGNOSIS — Z6832 Body mass index (BMI) 32.0-32.9, adult: Secondary | ICD-10-CM | POA: Diagnosis not present

## 2021-12-06 DIAGNOSIS — M5136 Other intervertebral disc degeneration, lumbar region: Secondary | ICD-10-CM | POA: Diagnosis not present

## 2021-12-06 DIAGNOSIS — M47816 Spondylosis without myelopathy or radiculopathy, lumbar region: Secondary | ICD-10-CM | POA: Diagnosis not present

## 2021-12-22 DIAGNOSIS — I10 Essential (primary) hypertension: Secondary | ICD-10-CM | POA: Diagnosis not present

## 2021-12-22 DIAGNOSIS — J440 Chronic obstructive pulmonary disease with acute lower respiratory infection: Secondary | ICD-10-CM | POA: Diagnosis not present

## 2021-12-22 DIAGNOSIS — Z23 Encounter for immunization: Secondary | ICD-10-CM | POA: Diagnosis not present

## 2021-12-22 DIAGNOSIS — M545 Low back pain, unspecified: Secondary | ICD-10-CM | POA: Diagnosis not present

## 2022-01-04 NOTE — Therapy (Signed)
OUTPATIENT PHYSICAL THERAPY THORACOLUMBAR EVALUATION   Patient Name: Jeremy Mclean MRN: [id number] DOB:1950-09-25, 71 y.o., male Today's Date: 01/05/2022   PT End of Session - 01/05/22 1452     Visit Number 1    Number of Visits 8    Date for PT Re-Evaluation 02/02/22    Authorization Type [employer]    Authorization Time Period please check auth    Progress Note Due on Visit 10    PT Start Time 0245    PT Stop Time 0325    PT Time Calculation (min) 40 min    Activity Tolerance Patient tolerated treatment well             Past Medical History:  Diagnosis Date   AAA (abdominal aortic aneurysm) (HCC)    Anxiety    Arthritis    COPD (chronic obstructive pulmonary disease) (HCC)    Degenerative disorder of bone    Depression    Diverticulitis    Dyspnea    due to COPD and when walking long distances per patient   GERD (gastroesophageal reflux disease)    History of kidney stones    Hypertension    Kidney stone    Spinal stenosis    Past Surgical History:  Procedure Laterality Date   ABDOMINAL AORTIC ANEURYSM REPAIR N/A 01/16/2019   Procedure: OPEN REPAIR OF ABDOMINAL AORTIC ANEURYSM;  Surgeon: Larina Earthly, MD;  Location: [facility] OR;  Service: Vascular;  Laterality: N/A;   BIOPSY  01/14/2017   Procedure: BIOPSY;  Surgeon: West Bali, MD;  [facility];  Service: Endoscopy;;  gastric    CERVICAL SPINE SURGERY     C6-7 ACDF 09/22/09   COLONOSCOPY N/A 04/09/2014   Procedure: COLONOSCOPY;  Surgeon: Malissa Hippo, MD;  [facility];  Service: Endoscopy;  Laterality: N/A;  730 - moved to 7:30 - Ann to notify pt   CYSTOSCOPY     with stent   ESOPHAGOGASTRODUODENOSCOPY N/A 01/14/2017   Procedure: ESOPHAGOGASTRODUODENOSCOPY (EGD);  Surgeon: West Bali, MD;  [facility];  Service: Endoscopy;  Laterality: N/A;   kidney stent     Patient Active Problem List   Diagnosis Date Noted   Low back pain 08/21/2019   AAA (abdominal  aortic aneurysm) (HCC) 01/16/2019   UGIB (upper gastrointestinal bleed)    Acute blood loss anemia 01/13/2017   Melena 01/13/2017   Precordial chest pain 01/13/2017   Iliac artery aneurysm (HCC) 07/11/2014   Tobacco abuse 07/11/2014   History of colonic polyps 03/18/2014   Abdominal aortic aneurysm (HCC) 03/18/2014   Essential hypertension 03/18/2014    PCP: Carylon Perches, MD  REFERRING PROVIDER: PT eval/tx for 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 Yes: NPRS scale: 2-9/10 Pain location: low back and right knee Pain description: stabbing Aggravating factors: standing, walking Relieving factors: rest  PRECAUTIONS: None  WEIGHT BEARING RESTRICTIONS:  WBAT right knee  FALLS:  Has patient fallen in last 6 months? Yes. Number of falls 5-6  LIVING ENVIRONMENT: Lives with: lives with their spouse Lives in: House/apartment Stairs: Yes: External: 2 steps; on right going up, on left going up, and can reach both Has following equipment at home: Single point cane, Walker - 2 wheeled, Wheelchair (manual), shower chair, and bed side commode  OCCUPATION: farmer  PLOF: Independent  PATIENT GOALS: decrease pain in my back; walk farther  NEXT MD VISIT:   OBJECTIVE:   DIAGNOSTIC  FINDINGS:  CLINICAL DATA:  Meniscal injury, knee tibial fracture   EXAM: MRI OF THE RIGHT KNEE WITHOUT CONTRAST   TECHNIQUE: Multiplanar, multisequence MR imaging of the knee was performed. No intravenous contrast was administered.   COMPARISON:  Knee radiograph 07/28/2021   FINDINGS: MENISCI   Medial: There is a suspected small nondisplaced flap tear of the posterior horn medial meniscus approximately 1.0 cm from the posterior root.   Lateral: Intact.   LIGAMENTS   Cruciates: ACL and PCL are intact.   Collaterals: Medial collateral ligament is intact. Lateral collateral ligament complex is intact.   CARTILAGE   Patellofemoral: Intermediate grade partial-thickness cartilage loss along the upper lateral patellar facet. Mild trochlear chondrosis.   Medial: High-grade and full-thickness cartilage loss along the weight-bearing surfaces. Extensive subchondral marrow edema, predominantly along the medial tibial plateau.   Lateral: Mild chondrosis with focal shallow chondral defect measuring 3 mm in width along the inner lateral tibial plateau posteriorly.   JOINT: Large joint effusion.   POPLITEAL FOSSA: Miniscule Baker's cyst.   EXTENSOR MECHANISM: Intact quadriceps tendon. Intact patellar tendon.   BONES: Extensive marrow edema within the subchondral medial tibial plateau likely related to a subchondral insufficiency fracture. There is additional scattered subchondral marrow edema related to arthritis. No aggressive osseous lesion. Tricompartment osteophyte formation.   Other: There is soft tissue swelling along the knee. No focal fluid collection.   IMPRESSION: Tricompartment osteoarthritis of the right knee, severe in the medial compartment. Subchondral marrow edema, extensive in the medial tibial plateau which is likely related to a subchondral insufficiency fracture.   Suspected small nondisplaced flap tear of the posterior horn medial meniscus  approximately 1.0 cm from the posterior root.   Large joint effusion.     Electronically Signed   By: Caprice Renshaw M.D.   On: 09/21/2021 16:29  PATIENT SURVEYS:  FOTO 45    COGNITION: Overall cognitive status: Within functional limits for tasks assessed     SENSATION: WFL    POSTURE: flexed trunk , weight shift left, and guarded posturing  PALPATION: Tender L5-S1 paraspinals  LUMBAR ROM:   AROM eval  Flexion 75% available  Extension 30% available **  Right lateral flexion   Left lateral flexion   Right rotation   Left rotation    (Blank rows = not tested)   LOWER EXTREMITY MMT:    MMT Right eval Left eval  Hip flexion 4+ 4-  Hip extension    Hip abduction    Hip adduction    Hip internal rotation    Hip external rotation  Knee flexion    Knee extension 3- 4+  Ankle dorsiflexion 4+ 4+  Ankle plantarflexion    Ankle inversion    Ankle eversion     (Blank rows = not tested)  FUNCTIONAL TESTS:  5 times sit to stand: 15.54   TODAY'S TREATMENT:                                                                                                                              DATE: 01/05/22 physical therapy evaluation and HEP instruction    Education details: Patient educated on exam findings, POC, scope of PT, HEP. Person educated: Patient Education method: Explanation, Demonstration, and Handouts Education comprehension: verbalized understanding, returned demonstration, verbal cues required, and tactile cues required  HOME EXERCISE PROGRAM: Access Code: [email or web address] URL: [email or web address] Date: 01/05/2022 Prepared by: [facility] - Rehab  Exercises - Supine Lower Trunk Rotation  - 2 x daily - 7 x weekly - 1 sets - 10 reps - Supine Transversus Abdominis Bracing - Hands on Stomach  - 2 x daily - 7 x weekly - 1 sets - 10 reps  ASSESSMENT:  CLINICAL IMPRESSION: Patient is a 71 y.o. male who was seen today for physical therapy evaluation and  treatment for Pt Eval And Tx For Q 76.49 Bertolotti's Syndrome-Per Benita Gutter NP . Patient demonstrates muscle weakness, reduced ROM, and fascial restrictions which are likely contributing to symptoms of pain and are negatively impacting patient ability to perform ADLs and functional mobility tasks. Patient will benefit from skilled physical therapy services to address these deficits to reduce pain and improve level of function with ADLs and functional mobility tasks.   OBJECTIVE IMPAIRMENTS: Abnormal gait, decreased activity tolerance, decreased balance, decreased endurance, decreased mobility, difficulty walking, decreased ROM, decreased strength, hypomobility, increased edema, increased fascial restrictions, impaired perceived functional ability, impaired flexibility, postural dysfunction, and pain.   ACTIVITY LIMITATIONS: carrying, lifting, bending, sitting, standing, squatting, sleeping, stairs, transfers, bathing, locomotion level, and caring for others  PARTICIPATION LIMITATIONS: meal prep, cleaning, laundry, shopping, community activity, and yard work  PERSONAL FACTORS:  recent right knee injury  are also affecting patient's functional outcome.   REHAB POTENTIAL: Good  CLINICAL DECISION MAKING: Stable/uncomplicated  EVALUATION COMPLEXITY: Low   GOALS: Goals reviewed with patient? No  SHORT TERM GOALS: Target date: 01/19/2022  Patient will be independent with initial HEP  Baseline: Goal status: INITIAL  2.   Patient will report at least 50% improvement in overall symptoms and/or function to demonstrate improved functional mobility  Baseline:  Goal status: INITIAL  LONG TERM GOALS: Target date: 02/02/2022  Patient will be independent in self management strategies to improve quality of life and functional outcomes.   Baseline:  Goal status: INITIAL  2.  Patient will improve FOTO score to predicted value  Baseline: 45 Goal status: INITIAL  3.  Patient will  increase bilateral lower extremity MMTs to 4+-5/5 without pain to promote return to ambulation community distances with minimal  deviation.  Baseline: see above Goal status: INITIAL  4.  Patient will be able to walk x 15 min with back pain no more than 2/10 to improve ability to perform a light shopping trip. Baseline:  Goal status: INITIAL  5.   Patient will report at least 75% improvement in overall symptoms and/or function to demonstrate improved functional mobility  Baseline:  Goal status: INITIAL  PLAN:  PT FREQUENCY: 1-2x/week  PT DURATION: 4 weeks  Therapeutic exercises, Therapeutic activity, Neuromuscular re-education, Balance training, Gait training, Patient/Family education, Joint manipulation, Joint mobilization, Stair training, Orthotic/Fit training, DME instructions, Aquatic Therapy, Dry Needling, Electrical stimulation, Spinal manipulation, Spinal mobilization, Cryotherapy, Moist heat, Compression bandaging, scar mobilization, Splintting, Taping, Traction, Ultrasound, Ionotophoresis 4mg /ml Dexamethasone, and Manual therapy   PLAN FOR NEXT SESSION: Review HEP and goals; continue with manual mobilizations to L5-S1 area; lumbar and core stabilization   3:37 PM, 01/05/22 Jair Lindblad Small Jenina Moening MPT [place] [facility] [place] [phone] Ph:[phone]

## 2022-01-05 ENCOUNTER — Ambulatory Visit (HOSPITAL_COMMUNITY): Payer: Medicare HMO | Attending: Neurosurgery

## 2022-01-05 DIAGNOSIS — M545 Low back pain, unspecified: Secondary | ICD-10-CM | POA: Insufficient documentation

## 2022-01-05 DIAGNOSIS — Q7649 Other congenital malformations of spine, not associated with scoliosis: Secondary | ICD-10-CM | POA: Insufficient documentation

## 2022-01-05 DIAGNOSIS — G8929 Other chronic pain: Secondary | ICD-10-CM | POA: Diagnosis not present

## 2022-01-21 ENCOUNTER — Ambulatory Visit (HOSPITAL_COMMUNITY): Payer: Medicare HMO | Attending: Neurosurgery

## 2022-01-21 ENCOUNTER — Encounter (HOSPITAL_COMMUNITY): Payer: Self-pay

## 2022-01-21 DIAGNOSIS — G8929 Other chronic pain: Secondary | ICD-10-CM | POA: Insufficient documentation

## 2022-01-21 DIAGNOSIS — M545 Low back pain, unspecified: Secondary | ICD-10-CM | POA: Insufficient documentation

## 2022-01-21 DIAGNOSIS — Q7649 Other congenital malformations of spine, not associated with scoliosis: Secondary | ICD-10-CM | POA: Insufficient documentation

## 2022-01-21 NOTE — Therapy (Signed)
OUTPATIENT PHYSICAL THERAPY THORACOLUMBAR EVALUATION   Patient Name: Jeremy Mclean MRN: [id number] DOB:[birth year], 71 y.o., male Today's Date: 01/21/2022   PT End of Session - 01/21/22 1058     Visit Number 2    Number of Visits 8    Date for PT Re-Evaluation 02/02/22    Authorization Type [employer]    Authorization Time Period please check auth    Progress Note Due on Visit 10    PT Start Time 1057    PT Stop Time 1138    PT Time Calculation (min) 41 min    Activity Tolerance Patient tolerated treatment well             Past Medical History:  Diagnosis Date   AAA (abdominal aortic aneurysm) (HCC)    Anxiety    Arthritis    COPD (chronic obstructive pulmonary disease) (HCC)    Degenerative disorder of bone    Depression    Diverticulitis    Dyspnea    due to COPD and when walking long distances per patient   GERD (gastroesophageal reflux disease)    History of kidney stones    Hypertension    Kidney stone    Spinal stenosis    Past Surgical History:  Procedure Laterality Date   ABDOMINAL AORTIC ANEURYSM REPAIR N/A 01/16/2019   Procedure: OPEN REPAIR OF ABDOMINAL AORTIC ANEURYSM;  Surgeon: Rosetta Posner, MD;  Location: [place];  Service: Vascular;  Laterality: N/A;   BIOPSY  01/14/2017   Procedure: BIOPSY;  Surgeon: Danie Binder, MD;  [facility];  Service: Endoscopy;;  gastric    CERVICAL SPINE SURGERY     C6-7 ACDF 09/22/09   COLONOSCOPY N/A 04/09/2014   Procedure: COLONOSCOPY;  Surgeon: Rogene Houston, MD;  [facility];  Service: Endoscopy;  Laterality: N/A;  730 - moved to 7:30 - Ann to notify pt   CYSTOSCOPY     with stent   ESOPHAGOGASTRODUODENOSCOPY N/A 01/14/2017   Procedure: ESOPHAGOGASTRODUODENOSCOPY (EGD);  Surgeon: Danie Binder, MD;  [facility];  Service: Endoscopy;  Laterality: N/A;   kidney stent     Patient Active Problem List   Diagnosis Date Noted   Low back pain 08/21/2019   AAA (abdominal  aortic aneurysm) ([place]) 01/16/2019   UGIB (upper gastrointestinal bleed)    Acute blood loss anemia 01/13/2017   Melena 01/13/2017   Precordial chest pain 01/13/2017   Iliac artery aneurysm ([place]) 07/11/2014   Tobacco abuse 07/11/2014   History of colonic polyps 03/18/2014   Abdominal aortic aneurysm ([place]) 03/18/2014   Essential hypertension 03/18/2014    PCP: Asencion Noble, MD  REFERRING PROVIDER: PT eval/tx for 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 Pt reports getting electric cart for home mobility.   PERTINENT HISTORY:  Right knee tibial plateau fx back in April/May Old CVA left side weakness residual  PAIN:  Are you having pain? Yes: NPRS scale: 10/10 Pain location: low back and right knee Pain description: stabbing Aggravating factors: standing, walking Relieving factors: rest  PRECAUTIONS: None  WEIGHT BEARING RESTRICTIONS:  WBAT right knee  FALLS:  Has patient fallen in last 6 months? Yes. Number of falls 5-6  LIVING ENVIRONMENT: Lives with: lives with their spouse Lives in: House/apartment Stairs: Yes: External: 2 steps; on right going up, on left going up, and can reach both Has following equipment at home: Single point cane, Walker - 2 wheeled, Wheelchair (manual), shower chair, and bed side commode  OCCUPATION: farmer  PLOF: Independent  PATIENT GOALS: decrease pain in my  back; walk farther  NEXT MD VISIT:   OBJECTIVE:   DIAGNOSTIC FINDINGS:  CLINICAL DATA:  Meniscal injury, knee tibial fracture   EXAM: MRI OF THE RIGHT KNEE WITHOUT CONTRAST   TECHNIQUE: Multiplanar, multisequence MR imaging of the knee was performed. No intravenous contrast was administered.   COMPARISON:  Knee radiograph 07/28/2021   FINDINGS: MENISCI   Medial: There is a suspected small nondisplaced flap tear of the posterior horn medial meniscus approximately 1.0 cm from the posterior root.   Lateral: Intact.   LIGAMENTS   Cruciates: ACL and PCL are intact.   Collaterals: Medial collateral ligament is intact. Lateral collateral ligament complex is intact.   CARTILAGE   Patellofemoral: Intermediate grade partial-thickness cartilage loss along the upper lateral patellar facet. Mild trochlear chondrosis.   Medial: High-grade and full-thickness cartilage loss along the weight-bearing surfaces. Extensive subchondral marrow edema, predominantly along the medial tibial plateau.   Lateral: Mild chondrosis with focal shallow chondral defect measuring 3 mm in width along the inner lateral tibial plateau posteriorly.   JOINT: Large joint effusion.   POPLITEAL FOSSA: Miniscule Baker's cyst.   EXTENSOR MECHANISM: Intact quadriceps tendon. Intact patellar tendon.   BONES: Extensive marrow edema within the subchondral medial tibial plateau likely related to a subchondral insufficiency fracture. There is additional scattered subchondral marrow edema related to arthritis. No aggressive osseous lesion. Tricompartment osteophyte formation.   Other: There is soft tissue swelling along the knee. No focal fluid collection.   IMPRESSION: Tricompartment osteoarthritis of the right knee, severe in the medial compartment. Subchondral marrow edema, extensive in the medial tibial plateau which is likely related to a subchondral insufficiency fracture.   Suspected small  nondisplaced flap tear of the posterior horn medial meniscus approximately 1.0 cm from the posterior root.   Large joint effusion.     Electronically Signed   By: Maurine Simmering M.D.   On: 09/21/2021 16:29  PATIENT SURVEYS:  FOTO 45    COGNITION: Overall cognitive status: Within functional limits for tasks assessed     SENSATION: WFL    POSTURE: flexed trunk , weight shift left, and guarded posturing  PALPATION: Tender L5-S1 paraspinals  LUMBAR ROM:   AROM eval  Flexion 75% available  Extension 30% available **  Right lateral flexion   Left lateral flexion   Right rotation   Left rotation    (Blank rows = not tested)   LOWER EXTREMITY MMT:    MMT Right eval Left eval  Hip flexion 4+ 4-  Hip extension    Hip abduction    Hip adduction  Hip internal rotation    Hip external rotation    Knee flexion    Knee extension 3- 4+  Ankle dorsiflexion 4+ 4+  Ankle plantarflexion    Ankle inversion    Ankle eversion     (Blank rows = not tested)  FUNCTIONAL TESTS:  5 times sit to stand: 15.54   TODAY'S TREATMENT:                                                                                                                              DATE:   01/21/22 PT Treatment session -L5-S1 Lumbar manual therapy-anterior distraction -Supine LTR for 3-4sec hold x 30 -TA bracing 5sec hold, tactile cues with B hands -TA bracing with B marching supine cues for breathing.     01/05/22 physical therapy evaluation and HEP instruction    Education details: Patient educated on exam findings, POC, scope of PT, HEP. Person educated: Patient Education method: Explanation, Demonstration, and Handouts Education comprehension: verbalized understanding, returned demonstration, verbal cues required, and tactile cues required  HOME EXERCISE PROGRAM: Access Code: [email or web address] URL: [email or web address] Date: 01/05/2022 Prepared by: [facility] - Re[facility]  Exercises - Supine  Lower Trunk Rotation  - 2 x daily - 7 x weekly - 1 sets - 10 reps - Supine Transversus Abdominis Bracing - Hands on Stomach  - 2 x daily - 7 x weekly - 1 sets - 10 reps  ASSESSMENT:  CLINICAL IMPRESSION: Today's session's focused on lumbar mobility, core strengthening and relieving back pain.  Manual distraction to L5-S1 area with reports of pain relief.  Patient will benefit from continued skilled therapy services to address deficits and promote return to optimal function.       Eval:Patient is a 71 y.o. 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 Patient demonstrates muscle weakness, reduced ROM, and fascial restrictions which are likely contributing to symptoms of pain and are negatively impacting patient ability to perform ADLs and functional mobility tasks. Patient will benefit from skilled physical therapy services to address these deficits to reduce pain and improve level of function with ADLs and functional mobility tasks.   OBJECTIVE IMPAIRMENTS: Abnormal gait, decreased activity tolerance, decreased balance, decreased endurance, decreased mobility, difficulty walking, decreased ROM, decreased strength, hypomobility, increased edema, increased fascial restrictions, impaired perceived functional ability, impaired flexibility, postural dysfunction, and pain.   ACTIVITY LIMITATIONS: carrying, lifting, bending, sitting, standing, squatting, sleeping, stairs, transfers, bathing, locomotion level, and caring for others  PARTICIPATION LIMITATIONS: meal prep, cleaning, laundry, shopping, community activity, and yard work  PERSONAL FACTORS:  recent right knee injury  are also affecting patient's functional outcome.   REHAB POTENTIAL: Good  CLINICAL DECISION MAKING: Stable/uncomplicated  EVALUATION COMPLEXITY: Low   GOALS: Goals reviewed with patient? No  SHORT TERM GOALS: Target  date: 01/19/2022  Patient will be independent with initial HEP  Baseline: Goal status:  INITIAL  2.   Patient will report at least 50% improvement in overall symptoms and/or function to demonstrate improved functional mobility  Baseline:  Goal status: INITIAL  LONG TERM GOALS: Target date: 02/02/2022  Patient will be independent in self management strategies to improve quality of life and functional outcomes.   Baseline:  Goal status: INITIAL  2.  Patient will improve FOTO score to predicted value  Baseline: 45 Goal status: INITIAL  3.  Patient will increase bilateral lower extremity MMTs to 4+-5/5 without pain to promote return to ambulation community distances with minimal deviation.  Baseline: see above Goal status: INITIAL  4.  Patient will be able to walk x 15 min with back pain no more than 2/10 to improve ability to perform a light shopping trip. Baseline:  Goal status: INITIAL  5.   Patient will report at least 75% improvement in overall symptoms and/or function to demonstrate improved functional mobility  Baseline:  Goal status: INITIAL  PLAN:  PT FREQUENCY: 1-2x/week  PT DURATION: 4 weeks  Therapeutic exercises, Therapeutic activity, Neuromuscular re-education, Balance training, Gait training, Patient/Family education, Joint manipulation, Joint mobilization, Stair training, Orthotic/Fit training, DME instructions, Aquatic Therapy, Dry Needling, Electrical stimulation, Spinal manipulation, Spinal mobilization, Cryotherapy, Moist heat, Compression bandaging, scar mobilization, Splintting, Taping, Traction, Ultrasound, Ionotophoresis '4mg'$ /ml Dexamethasone, and Manual therapy   PLAN FOR NEXT SESSION: Review HEP and goals; continue with manual mobilizations to L5-S1 area; lumbar and core stabilization   11:40 AM, 01/21/22 Arhaan Chesnut Small Sneha Willig MPT [place] [facility] [place] [phone] [phone]

## 2022-01-24 ENCOUNTER — Ambulatory Visit (HOSPITAL_COMMUNITY): Payer: Medicare HMO

## 2022-01-24 DIAGNOSIS — Q7649 Other congenital malformations of spine, not associated with scoliosis: Secondary | ICD-10-CM

## 2022-01-24 DIAGNOSIS — M545 Low back pain, unspecified: Secondary | ICD-10-CM | POA: Diagnosis not present

## 2022-01-24 DIAGNOSIS — G8929 Other chronic pain: Secondary | ICD-10-CM | POA: Diagnosis not present

## 2022-01-24 NOTE — Therapy (Signed)
OUTPATIENT PHYSICAL THERAPY THORACOLUMBAR TREATMENT   Patient Name: CASHAWN YANKO MRN: [id number] DOB:12/25/1950, 71 y.o., male Today's Date: 01/24/2022   PT End of Session - 01/24/22 7939     Visit Number 3    Number of Visits 8    Date for PT Re-Evaluation 02/02/22    Authorization Type [employer]    Authorization Time Period please check auth    Progress Note Due on Visit 10    PT Start Time 0935    PT Stop Time 1020    PT Time Calculation (min) 45 min    Activity Tolerance Patient tolerated treatment well    Behavior During Therapy WFL for tasks assessed/performed             Past Medical History:  Diagnosis Date   AAA (abdominal aortic aneurysm) (HCC)    Anxiety    Arthritis    COPD (chronic obstructive pulmonary disease) (HCC)    Degenerative disorder of bone    Depression    Diverticulitis    Dyspnea    due to COPD and when walking long distances per patient   GERD (gastroesophageal reflux disease)    History of kidney stones    Hypertension    Kidney stone    Spinal stenosis    Past Surgical History:  Procedure Laterality Date   ABDOMINAL AORTIC ANEURYSM REPAIR N/A 01/16/2019   Procedure: OPEN REPAIR OF ABDOMINAL AORTIC ANEURYSM;  Surgeon: Rosetta Posner, MD;  Location: [place];  Service: Vascular;  Laterality: N/A;   BIOPSY  01/14/2017   Procedure: BIOPSY;  Surgeon: Danie Binder, MD;  [facility];  Service: Endoscopy;;  gastric    CERVICAL SPINE SURGERY     C6-7 ACDF 09/22/09   COLONOSCOPY N/A 04/09/2014   Procedure: COLONOSCOPY;  Surgeon: Rogene Houston, MD;  [facility];  Service: Endoscopy;  Laterality: N/A;  730 - moved to 7:30 - Ann to notify pt   CYSTOSCOPY     with stent   ESOPHAGOGASTRODUODENOSCOPY N/A 01/14/2017   Procedure: ESOPHAGOGASTRODUODENOSCOPY (EGD);  Surgeon: Danie Binder, MD;  [facility];  Service: Endoscopy;  Laterality: N/A;   kidney stent     Patient Active Problem List    Diagnosis Date Noted   Low back pain 08/21/2019   AAA (abdominal aortic aneurysm) ([place]) 01/16/2019   UGIB (upper gastrointestinal bleed)    Acute blood loss anemia 01/13/2017   Melena 01/13/2017   Precordial chest pain 01/13/2017   Iliac artery aneurysm ([place]) 07/11/2014   Tobacco abuse 07/11/2014   History of colonic polyps 03/18/2014   Abdominal aortic aneurysm ([place]) 03/18/2014   Essential hypertension 03/18/2014    PCP: Asencion Noble, MD  REFERRING PROVIDER: PT eval/tx for 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  States back hurts when he first gets up but improves as he walks around. PERTINENT HISTORY:  Right knee tibial plateau fx back in April/May Old CVA left side weakness residual  PAIN:  Are you having pain? Yes: NPRS scale: 2 (back); 6 (right knee)/10 Pain location: low back and right knee Pain description: stabbing Aggravating factors: standing, walking Relieving factors: rest  PRECAUTIONS: None  WEIGHT BEARING RESTRICTIONS:  WBAT right knee  FALLS:  Has patient fallen in last 6 months? Yes. Number of falls 5-6  LIVING ENVIRONMENT: Lives with: lives with their spouse Lives in: House/apartment Stairs: Yes: External: 2 steps; on right going up, on left going up, and can reach both Has following equipment at home:  Single point cane, Walker - 2 wheeled, Wheelchair (manual), shower chair, and bed side commode  OCCUPATION: farmer  PLOF: Independent  PATIENT GOALS: decrease pain in my back; walk farther  NEXT MD VISIT:   OBJECTIVE:   DIAGNOSTIC FINDINGS:  CLINICAL DATA:  Meniscal injury, knee tibial fracture   EXAM: MRI OF THE RIGHT KNEE WITHOUT CONTRAST   TECHNIQUE: Multiplanar, multisequence MR imaging of the knee was performed. No intravenous contrast was administered.   COMPARISON:  Knee radiograph 07/28/2021   FINDINGS: MENISCI   Medial: There is a suspected small nondisplaced flap tear of the posterior horn medial meniscus approximately 1.0 cm from the posterior root.   Lateral: Intact.   LIGAMENTS   Cruciates: ACL and PCL are intact.   Collaterals: Medial collateral ligament is intact. Lateral collateral ligament complex is intact.   CARTILAGE   Patellofemoral: Intermediate grade partial-thickness cartilage loss along the upper lateral patellar facet. Mild trochlear chondrosis.   Medial: High-grade and full-thickness cartilage loss along the weight-bearing surfaces. Extensive subchondral marrow edema, predominantly along the medial tibial plateau.   Lateral: Mild chondrosis with focal shallow chondral defect measuring 3 mm in width along the inner lateral tibial plateau posteriorly.   JOINT: Large joint effusion.   POPLITEAL FOSSA: Miniscule Baker's cyst.   EXTENSOR MECHANISM: Intact quadriceps tendon. Intact patellar tendon.   BONES: Extensive marrow edema within the subchondral medial tibial plateau likely related to a subchondral insufficiency fracture. There is additional scattered subchondral marrow edema related to arthritis. No aggressive osseous lesion. Tricompartment osteophyte formation.   Other: There is soft tissue swelling along the knee. No focal fluid collection.   IMPRESSION: Tricompartment osteoarthritis of the right knee, severe in  the medial compartment. Subchondral marrow edema, extensive in the medial tibial plateau which is likely related to a subchondral insufficiency fracture.   Suspected small nondisplaced flap tear of the posterior horn medial meniscus approximately 1.0 cm from the posterior root.   Large joint effusion.     Electronically Signed   By: Maurine Simmering M.D.   On: 09/21/2021 16:29  PATIENT SURVEYS:  FOTO 45    COGNITION: Overall cognitive status: Within functional limits for tasks assessed     SENSATION: WFL    POSTURE: flexed trunk , weight shift left, and guarded posturing  PALPATION: Tender L5-S1 paraspinals  LUMBAR ROM:   AROM eval  Flexion 75% available  Extension 30% available **  Right lateral flexion   Left lateral flexion   Right rotation   Left rotation    (Blank rows = not tested)   LOWER EXTREMITY MMT:  MMT Right eval Left eval  Hip flexion 4+ 4-  Hip extension    Hip abduction    Hip adduction    Hip internal rotation    Hip external rotation    Knee flexion    Knee extension 3- 4+  Ankle dorsiflexion 4+ 4+  Ankle plantarflexion    Ankle inversion    Ankle eversion     (Blank rows = not tested)  FUNCTIONAL TESTS:  5 times sit to stand: 15.54   TODAY'S TREATMENT:                                                                                                                              DATE:  01/24/22 Supine: LTR x 20 Transverse abdominus 5" x 20 Transverse abdominus with march x 10 Bridge x 10 Hooklying position 4# ball flexion x 10 Manual distraction L5-S1 3 sets of 8  Lat pulls 40# x 8    01/21/22 PT Treatment session -L5-S1 Lumbar manual therapy-anterior distraction -Supine LTR for 3-4sec hold x 30 -TA bracing 5sec hold, tactile cues with B hands -TA bracing with B marching supine cues for breathing.     01/05/22 physical therapy evaluation and HEP instruction    Education details: Patient educated on exam findings,  POC, scope of PT, HEP. Person educated: Patient Education method: Explanation, Demonstration, and Handouts Education comprehension: verbalized understanding, returned demonstration, verbal cues required, and tactile cues required  HOME EXERCISE PROGRAM: Access Code: [email or web address] URL: [email or web address] Date: 01/05/2022 Prepared by: [facility]  Exercises - Supine Lower Trunk Rotation  - 2 x daily - 7 x weekly - 1 sets - 10 reps - Supine Transversus Abdominis Bracing - Hands on Stomach  - 2 x daily - 7 x weekly - 1 sets - 10 reps  ASSESSMENT:  CLINICAL IMPRESSION: Today's session's focused on lumbar mobility, core strengthening and relieving back pain. Added strengthening exercise without issue. Manual distraction to L5-S1 area increased reps with reports of pain relief.  Patient will benefit from continued skilled therapy services to address deficits and promote return to optimal function.       Eval:Patient is a 71 y.o. 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 Patient demonstrates muscle weakness, reduced ROM, and fascial restrictions which are likely contributing to symptoms of pain and are negatively impacting patient ability to perform ADLs and functional mobility tasks. Patient will benefit from skilled physical therapy services to address these deficits to reduce pain and improve level of function with ADLs and functional mobility tasks.   OBJECTIVE IMPAIRMENTS: Abnormal gait, decreased activity tolerance, decreased balance, decreased endurance, decreased mobility, difficulty walking, decreased ROM, decreased strength, hypomobility, increased edema, increased fascial restrictions, impaired perceived functional ability, impaired flexibility, postural dysfunction, and pain.   ACTIVITY LIMITATIONS: carrying, lifting, bending, sitting, standing, squatting, sleeping,  stairs, transfers, bathing, locomotion level,  and caring for others  PARTICIPATION LIMITATIONS: meal prep, cleaning, laundry, shopping, community activity, and yard work  PERSONAL FACTORS:  recent right knee injury  are also affecting patient's functional outcome.   REHAB POTENTIAL: Good  CLINICAL DECISION MAKING: Stable/uncomplicated  EVALUATION COMPLEXITY: Low   GOALS: Goals reviewed with patient? No  SHORT TERM GOALS: Target date: 01/19/2022  Patient will be independent with initial HEP  Baseline: Goal status: INITIAL  2.   Patient will report at least 50% improvement in overall symptoms and/or function to demonstrate improved functional mobility  Baseline:  Goal status: INITIAL  LONG TERM GOALS: Target date: 02/02/2022  Patient will be independent in self management strategies to improve quality of life and functional outcomes.   Baseline:  Goal status: INITIAL  2.  Patient will improve FOTO score to predicted value  Baseline: 45 Goal status: INITIAL  3.  Patient will increase bilateral lower extremity MMTs to 4+-5/5 without pain to promote return to ambulation community distances with minimal deviation.  Baseline: see above Goal status: INITIAL  4.  Patient will be able to walk x 15 min with back pain no more than 2/10 to improve ability to perform a light shopping trip. Baseline:  Goal status: INITIAL  5.   Patient will report at least 75% improvement in overall symptoms and/or function to demonstrate improved functional mobility  Baseline:  Goal status: INITIAL  PLAN:  PT FREQUENCY: 1-2x/week  PT DURATION: 4 weeks  Therapeutic exercises, Therapeutic activity, Neuromuscular re-education, Balance training, Gait training, Patient/Family education, Joint manipulation, Joint mobilization, Stair training, Orthotic/Fit training, DME instructions, Aquatic Therapy, Dry Needling, Electrical stimulation, Spinal manipulation, Spinal mobilization, Cryotherapy,  Moist heat, Compression bandaging, scar mobilization, Splintting, Taping, Traction, Ultrasound, Ionotophoresis '4mg'$ /ml Dexamethasone, and Manual therapy   PLAN FOR NEXT SESSION: continue with manual mobilizations to L5-S1 area; lumbar and core stabilization   9:38 AM, 01/24/22 Mohamed Portlock Small Izan Miron MPT [facility] [place] [phone] Ph:[phone]

## 2022-01-26 ENCOUNTER — Ambulatory Visit (HOSPITAL_COMMUNITY): Payer: Medicare HMO

## 2022-01-26 DIAGNOSIS — G8929 Other chronic pain: Secondary | ICD-10-CM | POA: Diagnosis not present

## 2022-01-26 DIAGNOSIS — Q7649 Other congenital malformations of spine, not associated with scoliosis: Secondary | ICD-10-CM | POA: Diagnosis not present

## 2022-01-26 DIAGNOSIS — M545 Low back pain, unspecified: Secondary | ICD-10-CM | POA: Diagnosis not present

## 2022-01-26 NOTE — Therapy (Signed)
OUTPATIENT PHYSICAL THERAPY THORACOLUMBAR TREATMENT   Patient Name: Jeremy Mclean MRN: [id number] DOB:12/14/1950, 71 y.o., male Today's Date: 01/26/2022   PT End of Session - 01/26/22 1347     Visit Number 4    Number of Visits 8    Date for PT Re-Evaluation 02/02/22    Authorization Type [employer]    Authorization Time Period please check auth    Progress Note Due on Visit 10    PT Start Time 1345    PT Stop Time 1423    PT Time Calculation (min) 38 min    Activity Tolerance Patient tolerated treatment well    Behavior During Therapy WFL for tasks assessed/performed             Past Medical History:  Diagnosis Date   AAA (abdominal aortic aneurysm) ([place])    Anxiety    Arthritis    COPD (chronic obstructive pulmonary disease) (HCC)    Degenerative disorder of bone    Depression    Diverticulitis    Dyspnea    due to COPD and when walking long distances per patient   GERD (gastroesophageal reflux disease)    History of kidney stones    Hypertension    Kidney stone    Spinal stenosis    Past Surgical History:  Procedure Laterality Date   ABDOMINAL AORTIC ANEURYSM REPAIR N/A 01/16/2019   Procedure: OPEN REPAIR OF ABDOMINAL AORTIC ANEURYSM;  Surgeon: Rosetta Posner, MD;  Location: [place];  Service: Vascular;  Laterality: N/A;   BIOPSY  01/14/2017   Procedure: BIOPSY;  Surgeon: Danie Binder, MD;  [facility];  Service: Endoscopy;;  gastric    CERVICAL SPINE SURGERY     C6-7 ACDF 09/22/09   COLONOSCOPY N/A 04/09/2014   Procedure: COLONOSCOPY;  Surgeon: Rogene Houston, MD;  [facility];  Service: Endoscopy;  Laterality: N/A;  730 - moved to 7:30 - Ann to notify pt   CYSTOSCOPY     with stent   ESOPHAGOGASTRODUODENOSCOPY N/A 01/14/2017   Procedure: ESOPHAGOGASTRODUODENOSCOPY (EGD);  Surgeon: Danie Binder, MD;  [facility];  Service: Endoscopy;  Laterality: N/A;   kidney stent     Patient Active Problem List    Diagnosis Date Noted   Low back pain 08/21/2019   AAA (abdominal aortic aneurysm) ([place]) 01/16/2019   UGIB (upper gastrointestinal bleed)    Acute blood loss anemia 01/13/2017   Melena 01/13/2017   Precordial chest pain 01/13/2017   Iliac artery aneurysm ([place]) 07/11/2014   Tobacco abuse 07/11/2014   History of colonic polyps 03/18/2014   Abdominal aortic aneurysm ([place]) 03/18/2014   Essential hypertension 03/18/2014    PCP: Asencion Noble, MD  REFERRING PROVIDER: PT eval/tx for 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 per Fenton Malling, NP  REFERRING DIAG: PT eval/tx for 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 Yes: NPRS scale: 1 (back); 5 (right knee)/10 Pain location: low back and right knee Pain description: stabbing Aggravating factors: standing, walking Relieving factors: rest  PRECAUTIONS: None  WEIGHT BEARING RESTRICTIONS:  WBAT right knee  FALLS:  Has patient fallen in last 6 months? Yes. Number of falls 5-6  LIVING ENVIRONMENT: Lives with: lives with their spouse Lives in: House/apartment Stairs: Yes: External: 2 steps; on right going up, on left going up, and can reach both Has following equipment at home: Single point cane, Walker - 2 wheeled, Wheelchair (manual), shower  chair, and bed side commode  OCCUPATION: farmer  PLOF: Independent  PATIENT GOALS: decrease pain in my back; walk farther  NEXT MD VISIT:   OBJECTIVE:   DIAGNOSTIC FINDINGS:  CLINICAL DATA:  Meniscal injury, knee tibial fracture   EXAM: MRI OF THE RIGHT KNEE WITHOUT CONTRAST   TECHNIQUE: Multiplanar, multisequence MR imaging of the knee was performed. No intravenous contrast was administered.   COMPARISON:  Knee radiograph 07/28/2021   FINDINGS: MENISCI   Medial: There is a suspected small nondisplaced flap tear of the posterior horn medial meniscus approximately 1.0 cm from the posterior root.   Lateral: Intact.   LIGAMENTS   Cruciates: ACL and PCL are intact.   Collaterals: Medial collateral ligament is intact. Lateral collateral ligament complex is intact.   CARTILAGE   Patellofemoral: Intermediate grade partial-thickness cartilage loss along the upper lateral patellar facet. Mild trochlear chondrosis.   Medial: High-grade and full-thickness cartilage loss along the weight-bearing surfaces. Extensive subchondral marrow edema, predominantly along the medial tibial plateau.   Lateral: Mild chondrosis with focal shallow chondral defect measuring 3 mm in width along the inner lateral tibial plateau posteriorly.   JOINT: Large joint effusion.   POPLITEAL FOSSA: Miniscule Baker's cyst.   EXTENSOR MECHANISM: Intact quadriceps tendon. Intact patellar tendon.   BONES: Extensive marrow edema within the subchondral medial tibial plateau likely related to a subchondral insufficiency fracture. There is additional scattered subchondral marrow edema related to arthritis. No aggressive osseous lesion. Tricompartment osteophyte formation.   Other: There is soft tissue swelling along the knee. No focal fluid collection.   IMPRESSION: Tricompartment osteoarthritis of the right knee, severe in the medial compartment. Subchondral marrow edema, extensive in  the medial tibial plateau which is likely related to a subchondral insufficiency fracture.   Suspected small nondisplaced flap tear of the posterior horn medial meniscus approximately 1.0 cm from the posterior root.   Large joint effusion.     Electronically Signed   By: Maurine Simmering M.D.   On: 09/21/2021 16:29  PATIENT SURVEYS:  FOTO 45    COGNITION: Overall cognitive status: Within functional limits for tasks assessed     SENSATION: WFL    POSTURE: flexed trunk , weight shift left, and guarded posturing  PALPATION: Tender L5-S1 paraspinals  LUMBAR ROM:   AROM eval  Flexion 75% available  Extension 30% available **  Right lateral flexion   Left lateral flexion   Right rotation   Left rotation    (Blank rows = not tested)   LOWER EXTREMITY MMT:    MMT Right eval Left eval  Hip flexion 4+ 4-  Hip extension    Hip abduction  Hip adduction    Hip internal rotation    Hip external rotation    Knee flexion    Knee extension 3- 4+  Ankle dorsiflexion 4+ 4+  Ankle plantarflexion    Ankle inversion    Ankle eversion     (Blank rows = not tested)  FUNCTIONAL TESTS:  5 times sit to stand: 15.54   TODAY'S TREATMENT:                                                                                                                              DATE:  01/26/22 Supine: LTR x 20 Transverse abdominus 5" x 20 Transverse abdominus with hip adduction with ball 5" x 10 Transverse abdominus with march x 10 Bridge x 10 Hooklying position 4# ball flexion x 10  Lat pull 50# x 8  Prone lying manual distraction L5-S1  3 sets of 8  01/24/22 Supine: LTR x 20 Transverse abdominus 5" x 20 Transverse abdominus with march x 10 Bridge x 10 Hooklying position 4# ball flexion x 10  Prone: Manual distraction L5-S1 3 sets of 8  Lat pulls 40# x 8    01/21/22 PT Treatment session -L5-S1 Lumbar manual therapy-anterior distraction -Supine LTR for 3-4sec hold x  30 -TA bracing 5sec hold, tactile cues with B hands -TA bracing with B marching supine cues for breathing.     01/05/22 physical therapy evaluation and HEP instruction    Education details: Patient educated on exam findings, POC, scope of PT, HEP. Person educated: Patient Education method: Explanation, Demonstration, and Handouts Education comprehension: verbalized understanding, returned demonstration, verbal cues required, and tactile cues required  HOME EXERCISE PROGRAM: Access Code: [email or web address] URL: [email or web address] Date: 01/05/2022 Prepared by: [facility]  Exercises - Supine Lower Trunk Rotation  - 2 x daily - 7 x weekly - 1 sets - 10 reps - Supine Transversus Abdominis Bracing - Hands on Stomach  - 2 x daily - 7 x weekly - 1 sets - 10 reps  ASSESSMENT:  CLINICAL IMPRESSION: Today's session's focused on lumbar mobility, core strengthening and relieving back pain.Continued current program as patient is getting good results. Progressed core strengthening exercise.  Manual distraction to L5-S1 area increased reps with reports of pain relief.  Patient will benefit from continued skilled therapy services to address deficits and promote return to optimal function.       Eval:Patient is a 71 y.o. 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 Patient demonstrates muscle weakness, reduced ROM, and fascial restrictions which are likely contributing to symptoms of pain and are negatively impacting patient ability to perform ADLs and functional mobility tasks. Patient will benefit from skilled physical therapy services to address these deficits to reduce pain and improve level of function with ADLs and functional mobility tasks.   OBJECTIVE IMPAIRMENTS: Abnormal gait, decreased activity tolerance, decreased balance,  decreased endurance, decreased mobility, difficulty  walking, decreased ROM, decreased strength, hypomobility, increased edema, increased fascial restrictions, impaired perceived functional ability, impaired flexibility, postural dysfunction, and pain.   ACTIVITY LIMITATIONS: carrying, lifting, bending, sitting, standing, squatting, sleeping, stairs, transfers, bathing, locomotion level, and caring for others  PARTICIPATION LIMITATIONS: meal prep, cleaning, laundry, shopping, community activity, and yard work  PERSONAL FACTORS:  recent right knee injury  are also affecting patient's functional outcome.   REHAB POTENTIAL: Good  CLINICAL DECISION MAKING: Stable/uncomplicated  EVALUATION COMPLEXITY: Low   GOALS: Goals reviewed with patient? Yes  SHORT TERM GOALS: Target date: 01/19/2022  Patient will be independent with initial HEP  Baseline: Goal status: IN PROGRESS  2.   Patient will report at least 50% improvement in overall symptoms and/or function to demonstrate improved functional mobility  Baseline:  Goal status: IN PROGRESS  LONG TERM GOALS: Target date: 02/02/2022  Patient will be independent in self management strategies to improve quality of life and functional outcomes.   Baseline:  Goal status: IN PROGRESS  2.  Patient will improve FOTO score to predicted value  Baseline: 45 Goal status: IN PROGRESS  3.  Patient will increase bilateral lower extremity MMTs to 4+-5/5 without pain to promote return to ambulation community distances with minimal deviation.  Baseline: see above Goal status: IN PROGRESS  4.  Patient will be able to walk x 15 min with back pain no more than 2/10 to improve ability to perform a light shopping trip. Baseline:  Goal status: IN PROGRESS  5.   Patient will report at least 75% improvement in overall symptoms and/or function to demonstrate improved functional mobility  Baseline:  Goal status: IN PROGRESS  PLAN:  PT FREQUENCY: 1-2x/week  PT DURATION: 4 weeks  Therapeutic  exercises, Therapeutic activity, Neuromuscular re-education, Balance training, Gait training, Patient/Family education, Joint manipulation, Joint mobilization, Stair training, Orthotic/Fit training, DME instructions, Aquatic Therapy, Dry Needling, Electrical stimulation, Spinal manipulation, Spinal mobilization, Cryotherapy, Moist heat, Compression bandaging, scar mobilization, Splintting, Taping, Traction, Ultrasound, Ionotophoresis '4mg'$ /ml Dexamethasone, and Manual therapy   PLAN FOR NEXT SESSION: continue with manual mobilizations to L5-S1 area; lumbar and core stabilization   2:23 PM, 01/26/22 Deisha Stull Small Roran Wegner MPT [facility] [place] [phone] Ph:[phone]

## 2022-01-27 DIAGNOSIS — L821 Other seborrheic keratosis: Secondary | ICD-10-CM | POA: Diagnosis not present

## 2022-01-27 DIAGNOSIS — D225 Melanocytic nevi of trunk: Secondary | ICD-10-CM | POA: Diagnosis not present

## 2022-01-27 DIAGNOSIS — X32XXXA Exposure to sunlight, initial encounter: Secondary | ICD-10-CM | POA: Diagnosis not present

## 2022-01-27 DIAGNOSIS — L570 Actinic keratosis: Secondary | ICD-10-CM | POA: Diagnosis not present

## 2022-02-01 ENCOUNTER — Ambulatory Visit (HOSPITAL_COMMUNITY): Payer: Medicare HMO

## 2022-02-01 DIAGNOSIS — M545 Low back pain, unspecified: Secondary | ICD-10-CM | POA: Diagnosis not present

## 2022-02-01 DIAGNOSIS — Q7649 Other congenital malformations of spine, not associated with scoliosis: Secondary | ICD-10-CM

## 2022-02-01 DIAGNOSIS — G8929 Other chronic pain: Secondary | ICD-10-CM | POA: Diagnosis not present

## 2022-02-01 NOTE — Therapy (Signed)
OUTPATIENT PHYSICAL THERAPY THORACOLUMBAR TREATMENT   Patient Name: Jeremy Mclean MRN: [id number] DOB:Apr 08, 1950, 71 y.o., male Today's Date: 02/01/2022   PT End of Session - 02/01/22 1306     Visit Number 5    Number of Visits 8    Date for PT Re-Evaluation 02/02/22    Authorization Type [employer]    Authorization Time Period please check auth    Progress Note Due on Visit 10    PT Start Time 1305    PT Stop Time 1323    PT Time Calculation (min) 18 min    Activity Tolerance Patient tolerated treatment well    Behavior During Therapy WFL for tasks assessed/performed             Past Medical History:  Diagnosis Date   AAA (abdominal aortic aneurysm) (HCC)    Anxiety    Arthritis    COPD (chronic obstructive pulmonary disease) (HCC)    Degenerative disorder of bone    Depression    Diverticulitis    Dyspnea    due to COPD and when walking long distances per patient   GERD (gastroesophageal reflux disease)    History of kidney stones    Hypertension    Kidney stone    Spinal stenosis    Past Surgical History:  Procedure Laterality Date   ABDOMINAL AORTIC ANEURYSM REPAIR N/A 01/16/2019   Procedure: OPEN REPAIR OF ABDOMINAL AORTIC ANEURYSM;  Surgeon: Rosetta Posner, MD;  Location: [place];  Service: Vascular;  Laterality: N/A;   BIOPSY  01/14/2017   Procedure: BIOPSY;  Surgeon: Danie Binder, MD;  [facility];  Service: Endoscopy;;  gastric    CERVICAL SPINE SURGERY     C6-7 ACDF 09/22/09   COLONOSCOPY N/A 04/09/2014   Procedure: COLONOSCOPY;  Surgeon: Rogene Houston, MD;  [facility];  Service: Endoscopy;  Laterality: N/A;  730 - moved to 7:30 - Ann to notify pt   CYSTOSCOPY     with stent   ESOPHAGOGASTRODUODENOSCOPY N/A 01/14/2017   Procedure: ESOPHAGOGASTRODUODENOSCOPY (EGD);  Surgeon: Danie Binder, MD;  [facility];  Service: Endoscopy;  Laterality: N/A;   kidney stent     Patient Active Problem List    Diagnosis Date Noted   Low back pain 08/21/2019   AAA (abdominal aortic aneurysm) ([place]) 01/16/2019   UGIB (upper gastrointestinal bleed)    Acute blood loss anemia 01/13/2017   Melena 01/13/2017   Precordial chest pain 01/13/2017   Iliac artery aneurysm ([place]) 07/11/2014   Tobacco abuse 07/11/2014   History of colonic polyps 03/18/2014   Abdominal aortic aneurysm ([place]) 03/18/2014   Essential hypertension 03/18/2014    PCP: Asencion Noble, MD  REFERRING PROVIDER: PT eval/tx for 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  " I almost cancelled today"; Back is hurting some but overall much better.  No knee pain today PERTINENT HISTORY:  Right knee tibial plateau fx back in April/May Old CVA left side weakness residual  PAIN:  Are you having pain? Yes: NPRS scale: 1 (back); 5 (right knee)/10 Pain location: low back and right knee Pain description: stabbing Aggravating factors: standing, walking Relieving factors: rest  PRECAUTIONS: None  WEIGHT BEARING RESTRICTIONS:  WBAT right knee  FALLS:  Has patient fallen in last 6 months? Yes. Number of falls 5-6  LIVING ENVIRONMENT: Lives with: lives with their spouse Lives in: House/apartment Stairs: Yes: External: 2 steps; on right going  up, on left going up, and can reach both Has following equipment at home: Single point cane, Walker - 2 wheeled, Wheelchair (manual), shower chair, and bed side commode  OCCUPATION: farmer  PLOF: Independent  PATIENT GOALS: decrease pain in my back; walk farther  NEXT MD VISIT:   OBJECTIVE:   DIAGNOSTIC FINDINGS:  CLINICAL DATA:  Meniscal injury, knee tibial fracture   EXAM: MRI OF THE RIGHT KNEE WITHOUT CONTRAST   TECHNIQUE: Multiplanar, multisequence MR imaging of the knee was performed. No intravenous contrast was administered.   COMPARISON:  Knee radiograph 07/28/2021   FINDINGS: MENISCI   Medial: There is a suspected small nondisplaced flap tear of the posterior horn medial meniscus approximately 1.0 cm from the posterior root.   Lateral: Intact.   LIGAMENTS   Cruciates: ACL and PCL are intact.   Collaterals: Medial collateral ligament is intact. Lateral collateral ligament complex is intact.   CARTILAGE   Patellofemoral: Intermediate grade partial-thickness cartilage loss along the upper lateral patellar facet. Mild trochlear chondrosis.   Medial: High-grade and full-thickness cartilage loss along the weight-bearing surfaces. Extensive subchondral marrow edema, predominantly along the medial tibial plateau.   Lateral: Mild chondrosis with focal shallow chondral defect measuring 3 mm in width along the inner lateral tibial plateau posteriorly.   JOINT: Large joint effusion.   POPLITEAL FOSSA: Miniscule Baker's cyst.   EXTENSOR MECHANISM: Intact quadriceps tendon. Intact patellar tendon.   BONES: Extensive marrow edema within the subchondral medial tibial plateau likely related to a subchondral insufficiency fracture. There is additional scattered subchondral marrow edema related to arthritis. No aggressive osseous lesion. Tricompartment osteophyte formation.   Other: There is soft tissue swelling along the knee. No focal fluid collection.    IMPRESSION: Tricompartment osteoarthritis of the right knee, severe in the medial compartment. Subchondral marrow edema, extensive in the medial tibial plateau which is likely related to a subchondral insufficiency fracture.   Suspected small nondisplaced flap tear of the posterior horn medial meniscus approximately 1.0 cm from the posterior root.   Large joint effusion.     Electronically Signed   By: Maurine Simmering M.D.   On: 09/21/2021 16:29  PATIENT SURVEYS:  FOTO 45    COGNITION: Overall cognitive status: Within functional limits for tasks assessed     SENSATION: WFL    POSTURE: flexed trunk , weight shift left, and guarded posturing  PALPATION: Tender L5-S1 paraspinals  LUMBAR ROM:   AROM eval  Flexion 75% available  Extension 30% available **  Right lateral flexion   Left lateral flexion   Right rotation   Left rotation    (Blank rows =  not tested)   LOWER EXTREMITY MMT:    MMT Right eval Left eval  Hip flexion 4+ 4-  Hip extension    Hip abduction    Hip adduction    Hip internal rotation    Hip external rotation    Knee flexion    Knee extension 3- 4+  Ankle dorsiflexion 4+ 4+  Ankle plantarflexion    Ankle inversion    Ankle eversion     (Blank rows = not tested)  FUNCTIONAL TESTS:  5 times sit to stand: 15.54   TODAY'S TREATMENT:                                                                                                                              DATE:  02/01/22 Prone lying manual distraction L5-S1  3 sets of 12   01/26/22 Supine: LTR x 20 Transverse abdominus 5" x 20 Transverse abdominus with hip adduction with ball 5" x 10 Transverse abdominus with march x 10 Bridge x 10 Hooklying position 4# ball flexion x 10  Lat pull 50# x 8  Prone lying manual distraction L5-S1  3 sets of 8  01/24/22 Supine: LTR x 20 Transverse abdominus 5" x 20 Transverse abdominus with march x 10 Bridge x 10 Hooklying position 4# ball  flexion x 10  Prone: Manual distraction L5-S1 3 sets of 8  Lat pulls 40# x 8    01/21/22 PT Treatment session -L5-S1 Lumbar manual therapy-anterior distraction -Supine LTR for 3-4sec hold x 30 -TA bracing 5sec hold, tactile cues with B hands -TA bracing with B marching supine cues for breathing.     01/05/22 physical therapy evaluation and HEP instruction    Education details: Patient educated on exam findings, POC, scope of PT, HEP. Person educated: Patient Education method: Explanation, Demonstration, and Handouts Education comprehension: verbalized understanding, returned demonstration, verbal cues required, and tactile cues required  HOME EXERCISE PROGRAM: Access Code: [email or web address] URL: [email or web address] Date: 01/05/2022 Prepared by: [facility]  Exercises - Supine Lower Trunk Rotation  - 2 x daily - 7 x weekly - 1 sets - 10 reps - Supine Transversus Abdominis Bracing - Hands on Stomach  - 2 x daily - 7 x weekly - 1 sets - 10 reps  ASSESSMENT:  CLINICAL IMPRESSION: Patient with audible difficulty breathing today; audible wheeze.   O2 saturation at 91%.  Manual distraction only today due to patient malaise and breathing difficulty.  Patient will benefit from continued skilled therapy services to address deficits and promote return to optimal function.       Eval:Patient is a 71 y.o. 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 Patient demonstrates muscle weakness, reduced ROM, and fascial restrictions which are likely contributing to symptoms of pain and are negatively impacting patient ability to perform ADLs and functional mobility tasks. Patient will benefit from  skilled physical therapy services to address these deficits to reduce pain and improve level of function with ADLs and functional mobility tasks.   OBJECTIVE IMPAIRMENTS: Abnormal gait,  decreased activity tolerance, decreased balance, decreased endurance, decreased mobility, difficulty walking, decreased ROM, decreased strength, hypomobility, increased edema, increased fascial restrictions, impaired perceived functional ability, impaired flexibility, postural dysfunction, and pain.   ACTIVITY LIMITATIONS: carrying, lifting, bending, sitting, standing, squatting, sleeping, stairs, transfers, bathing, locomotion level, and caring for others  PARTICIPATION LIMITATIONS: meal prep, cleaning, laundry, shopping, community activity, and yard work  PERSONAL FACTORS:  recent right knee injury  are also affecting patient's functional outcome.   REHAB POTENTIAL: Good  CLINICAL DECISION MAKING: Stable/uncomplicated  EVALUATION COMPLEXITY: Low   GOALS: Goals reviewed with patient? Yes  SHORT TERM GOALS: Target date: 01/19/2022  Patient will be independent with initial HEP  Baseline: Goal status: IN PROGRESS  2.   Patient will report at least 50% improvement in overall symptoms and/or function to demonstrate improved functional mobility  Baseline:  Goal status: IN PROGRESS  LONG TERM GOALS: Target date: 02/02/2022  Patient will be independent in self management strategies to improve quality of life and functional outcomes.   Baseline:  Goal status: IN PROGRESS  2.  Patient will improve FOTO score to predicted value  Baseline: 45 Goal status: IN PROGRESS  3.  Patient will increase bilateral lower extremity MMTs to 4+-5/5 without pain to promote return to ambulation community distances with minimal deviation.  Baseline: see above Goal status: IN PROGRESS  4.  Patient will be able to walk x 15 min with back pain no more than 2/10 to improve ability to perform a light shopping trip. Baseline:  Goal status: IN PROGRESS  5.   Patient will report at least 75% improvement in overall symptoms and/or function to demonstrate improved functional mobility  Baseline:  Goal  status: IN PROGRESS  PLAN:  PT FREQUENCY: 1-2x/week  PT DURATION: 4 weeks  Therapeutic exercises, Therapeutic activity, Neuromuscular re-education, Balance training, Gait training, Patient/Family education, Joint manipulation, Joint mobilization, Stair training, Orthotic/Fit training, DME instructions, Aquatic Therapy, Dry Needling, Electrical stimulation, Spinal manipulation, Spinal mobilization, Cryotherapy, Moist heat, Compression bandaging, scar mobilization, Splintting, Taping, Traction, Ultrasound, Ionotophoresis '4mg'$ /ml Dexamethasone, and Manual therapy   PLAN FOR NEXT SESSION: continue with manual mobilizations to L5-S1 area; lumbar and core stabilization; resume exercise next visit.   1:28 PM, 02/01/22 Lee-Anne Flicker Small Damiel Barthold MPT [facility] [place] [phone]

## 2022-02-08 ENCOUNTER — Encounter (HOSPITAL_COMMUNITY): Payer: Medicare HMO

## 2022-02-08 DIAGNOSIS — J209 Acute bronchitis, unspecified: Secondary | ICD-10-CM | POA: Diagnosis not present

## 2022-02-10 ENCOUNTER — Ambulatory Visit (HOSPITAL_COMMUNITY): Payer: Medicare HMO

## 2022-02-10 DIAGNOSIS — Q7649 Other congenital malformations of spine, not associated with scoliosis: Secondary | ICD-10-CM | POA: Diagnosis not present

## 2022-02-10 DIAGNOSIS — M545 Low back pain, unspecified: Secondary | ICD-10-CM

## 2022-02-10 DIAGNOSIS — G8929 Other chronic pain: Secondary | ICD-10-CM | POA: Diagnosis not present

## 2022-02-10 NOTE — Therapy (Signed)
OUTPATIENT PHYSICAL THERAPY THORACOLUMBAR TREATMENT   Patient Name: Jeremy Mclean MRN: [id number] DOB:1950-07-27, 71 y.o., male Today's Date: 02/10/2022   PT End of Session - 02/10/22 1304     Visit Number 6    Number of Visits 8    Date for PT Re-Evaluation 03/11/22    Authorization Type [employer]    Authorization Time Period please check auth    Progress Note Due on Visit 10    PT Start Time 1304    PT Stop Time 1345    PT Time Calculation (min) 41 min    Activity Tolerance Patient tolerated treatment well    Behavior During Therapy WFL for tasks assessed/performed             Past Medical History:  Diagnosis Date   AAA (abdominal aortic aneurysm) (HCC)    Anxiety    Arthritis    COPD (chronic obstructive pulmonary disease) (HCC)    Degenerative disorder of bone    Depression    Diverticulitis    Dyspnea    due to COPD and when walking long distances per patient   GERD (gastroesophageal reflux disease)    History of kidney stones    Hypertension    Kidney stone    Spinal stenosis    Past Surgical History:  Procedure Laterality Date   ABDOMINAL AORTIC ANEURYSM REPAIR N/A 01/16/2019   Procedure: OPEN REPAIR OF ABDOMINAL AORTIC ANEURYSM;  Surgeon: Rosetta Posner, MD;  Location: [facility];  Service: Vascular;  Laterality: N/A;   BIOPSY  01/14/2017   Procedure: BIOPSY;  Surgeon: Danie Binder, MD;  [facility]: [facility];  Service: Endoscopy;;  gastric    CERVICAL SPINE SURGERY     C6-7 ACDF 09/22/09   COLONOSCOPY N/A 04/09/2014   Procedure: COLONOSCOPY;  Surgeon: Rogene Houston, MD;  Location: [facility];  Service: Endoscopy;  Laterality: N/A;  730 - moved to 7:30 - Ann to notify pt   CYSTOSCOPY     with stent   ESOPHAGOGASTRODUODENOSCOPY N/A 01/14/2017   Procedure: ESOPHAGOGASTRODUODENOSCOPY (EGD);  Surgeon: Danie Binder, MD;  [facility];  Service: Endoscopy;  Laterality: N/A;   kidney stent     Patient Active Problem List    Diagnosis Date Noted   Low back pain 08/21/2019   AAA (abdominal aortic aneurysm) ([place]) 01/16/2019   UGIB (upper gastrointestinal bleed)    Acute blood loss anemia 01/13/2017   Melena 01/13/2017   Precordial chest pain 01/13/2017   Iliac artery aneurysm ([place]) 07/11/2014   Tobacco abuse 07/11/2014   History of colonic polyps 03/18/2014   Abdominal aortic aneurysm ([place]) 03/18/2014   Essential hypertension 03/18/2014    PCP: Asencion Noble, MD  REFERRING PROVIDER: PT eval/tx for 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  ONSET DATE: 3 weeks ago  SUBJECTIVE:  SUBJECTIVE STATEMENT: Patient reports he is about 70-75% better overall; he had some trouble breathing last week; taking a steroid now; tested negative for COVD, flu and RSV.   PERTINENT HISTORY:  Right knee tibial plateau fx back in April/May Old CVA left side weakness residual  PAIN:  Are you having pain? Yes: NPRS scale: 1 (back); 5 (right knee)/10 Pain location: low back and right knee Pain description: stabbing Aggravating factors: standing, walking Relieving factors: rest  PRECAUTIONS: None  WEIGHT BEARING RESTRICTIONS:  WBAT right knee  FALLS:  Has patient fallen in last 6 months? Yes. Number of falls 5-6  LIVING ENVIRONMENT: Lives with: lives with their spouse Lives in: House/apartment Stairs: Yes: External: 2 steps; on right going up, on left going up, and can reach both Has following equipment at home: Single point cane, Walker - 2 wheeled, Wheelchair (manual), shower chair, and bed side commode  OCCUPATION: farmer  PLOF:  Independent  PATIENT GOALS: decrease pain in my back; walk farther  NEXT MD VISIT:   OBJECTIVE:   DIAGNOSTIC FINDINGS:  CLINICAL DATA:  Meniscal injury, knee tibial fracture   EXAM: MRI OF THE RIGHT KNEE WITHOUT CONTRAST   TECHNIQUE: Multiplanar, multisequence MR imaging of the knee was performed. No intravenous contrast was administered.   COMPARISON:  Knee radiograph 07/28/2021   FINDINGS: MENISCI   Medial: There is a suspected small nondisplaced flap tear of the posterior horn medial meniscus approximately 1.0 cm from the posterior root.   Lateral: Intact.   LIGAMENTS   Cruciates: ACL and PCL are intact.   Collaterals: Medial collateral ligament is intact. Lateral collateral ligament complex is intact.   CARTILAGE   Patellofemoral: Intermediate grade partial-thickness cartilage loss along the upper lateral patellar facet. Mild trochlear chondrosis.   Medial: High-grade and full-thickness cartilage loss along the weight-bearing surfaces. Extensive subchondral marrow edema, predominantly along the medial tibial plateau.   Lateral: Mild chondrosis with focal shallow chondral defect measuring 3 mm in width along the inner lateral tibial plateau posteriorly.   JOINT: Large joint effusion.   POPLITEAL FOSSA: Miniscule Baker's cyst.   EXTENSOR MECHANISM: Intact quadriceps tendon. Intact patellar tendon.   BONES: Extensive marrow edema within the subchondral medial tibial plateau likely related to a subchondral insufficiency fracture. There is additional scattered subchondral marrow edema related to arthritis. No aggressive osseous lesion. Tricompartment osteophyte formation.   Other: There is soft tissue swelling along the knee. No focal fluid collection.   IMPRESSION: Tricompartment osteoarthritis of the right knee, severe in the medial compartment. Subchondral marrow edema, extensive in the medial tibial plateau which is likely related to a  subchondral insufficiency fracture.   Suspected small nondisplaced flap tear of the posterior horn medial meniscus approximately 1.0 cm from the posterior root.   Large joint effusion.     Electronically Signed   By: Maurine Simmering M.D.   On: 09/21/2021 16:29  PATIENT SURVEYS:  FOTO 45    COGNITION: Overall cognitive status: Within functional limits for tasks assessed     SENSATION: WFL    POSTURE: flexed trunk , weight shift left, and guarded posturing  PALPATION: Tender L5-S1 paraspinals  LUMBAR ROM:   AROM eval 02/11/2379  Flexion 75% available 80%  available  Extension 30% available ** 60% available no pain  Right lateral flexion    Left lateral flexion    Right rotation    Left rotation     (Blank rows = not tested)   LOWER EXTREMITY MMT:  MMT Right eval Left eval Right 02/10/22 Left 02/10/22  Hip flexion 4+ 4- 5 5  Hip extension      Hip abduction      Hip adduction      Hip internal rotation      Hip external rotation      Knee flexion      Knee extension 3- 4+ 4+ 5  Ankle dorsiflexion 4+ 4+ 5 5  Ankle plantarflexion      Ankle inversion      Ankle eversion       (Blank rows = not tested)  FUNCTIONAL TESTS:  5 times sit to stand: 15.54   TODAY'S TREATMENT:                                                                                                                              DATE:  02/10/22 Progress note 5 times sit to stand:10.8 sec MMT's FOTO 61 Review of HEP  Prone lying manual distraction L5-S1  3 sets of 12   02/01/22 Prone lying manual distraction L5-S1  3 sets of 12   01/26/22 Supine: LTR x 20 Transverse abdominus 5" x 20 Transverse abdominus with hip adduction with ball 5" x 10 Transverse abdominus with march x 10 Bridge x 10 Hooklying position 4# ball flexion x 10  Lat pull 50# x 8  Prone lying manual distraction L5-S1  3 sets of 8  01/24/22 Supine: LTR x 20 Transverse abdominus 5" x 20 Transverse  abdominus with march x 10 Bridge x 10 Hooklying position 4# ball flexion x 10  Prone: Manual distraction L5-S1 3 sets of 8  Lat pulls 40# x 8    01/21/22 PT Treatment session -L5-S1 Lumbar manual therapy-anterior distraction -Supine LTR for 3-4sec hold x 30 -TA bracing 5sec hold, tactile cues with B hands -TA bracing with B marching supine cues for breathing.     01/05/22 physical therapy evaluation and HEP instruction    Education details: Patient educated on exam findings, POC, scope of PT, HEP. Person educated: Patient Education method: Explanation, Demonstration, and Handouts Education comprehension: verbalized understanding, returned demonstration, verbal cues required, and tactile cues required  HOME EXERCISE PROGRAM: Access Code: [email or web address] URL: [email or web address] Date: 01/05/2022 Prepared by: [facility]  Exercises - Supine Lower Trunk Rotation  - 2 x daily - 7 x weekly - 1 sets - 10 reps - Supine Transversus Abdominis Bracing - Hands on Stomach  - 2 x daily - 7 x weekly - 1 sets - 10 reps  ASSESSMENT:  CLINICAL IMPRESSION:  Progress note today.  Good improvement strength, mobility and functional scores.  2/2 STG's met and 2/5 LTG's met, therefore patient will benefit from continued services to address remaining unmet and partially met goals. Patient will benefit from continued skilled therapy services to address deficits and promote return to optimal function.       Eval:Patient is a 71 y.o. male who was seen today  for physical therapy evaluation and treatment for Pt Eval And Tx For Q 76.49 Bertolotti's Syndrome-Per Fenton Malling NP . Patient demonstrates muscle weakness, reduced ROM, and fascial restrictions which are likely contributing to symptoms of pain and are negatively impacting patient ability to perform ADLs and functional mobility tasks. Patient will benefit from skilled physical therapy services to address these deficits to reduce pain  and improve level of function with ADLs and functional mobility tasks.   OBJECTIVE IMPAIRMENTS: Abnormal gait, decreased activity tolerance, decreased balance, decreased endurance, decreased mobility, difficulty walking, decreased ROM, decreased strength, hypomobility, increased edema, increased fascial restrictions, impaired perceived functional ability, impaired flexibility, postural dysfunction, and pain.   ACTIVITY LIMITATIONS: carrying, lifting, bending, sitting, standing, squatting, sleeping, stairs, transfers, bathing, locomotion level, and caring for others  PARTICIPATION LIMITATIONS: meal prep, cleaning, laundry, shopping, community activity, and yard work  PERSONAL FACTORS:  recent right knee injury  are also affecting patient's functional outcome.   REHAB POTENTIAL: Good  CLINICAL DECISION MAKING: Stable/uncomplicated  EVALUATION COMPLEXITY: Low   GOALS: Goals reviewed with patient? Yes  SHORT TERM GOALS: Target date: 01/19/2022  Patient will be independent with initial HEP  Baseline: Goal status: MET  2.   Patient will report at least 50% improvement in overall symptoms and/or function to demonstrate improved functional mobility  Baseline:  Goal status: MET  LONG TERM GOALS: Target date: 03/11/2021  Patient will be independent in self management strategies to improve quality of life and functional outcomes.   Baseline:  Goal status: IN PROGRESS  2.  Patient will improve FOTO score to predicted value  Baseline: 45 Goal status: MET  3.  Patient will increase bilateral lower extremity MMTs to 4+-5/5 without pain to promote return to ambulation community distances with minimal deviation.  Baseline: see above Goal status: MET  4.  Patient will be able to walk x 15 min with back pain no more than 2/10 to improve ability to perform a light shopping trip. Baseline:  Goal status: IN PROGRESS  5.   Patient will report at least 75% improvement in overall  symptoms and/or function to demonstrate improved functional mobility  Baseline:  Goal status: IN PROGRESS  PLAN:  PT FREQUENCY: 1-2x/week  PT DURATION: 4 weeks  Therapeutic exercises, Therapeutic activity, Neuromuscular re-education, Balance training, Gait training, Patient/Family education, Joint manipulation, Joint mobilization, Stair training, Orthotic/Fit training, DME instructions, Aquatic Therapy, Dry Needling, Electrical stimulation, Spinal manipulation, Spinal mobilization, Cryotherapy, Moist heat, Compression bandaging, scar mobilization, Splintting, Taping, Traction, Ultrasound, Ionotophoresis 67m/ml Dexamethasone, and Manual therapy   PLAN FOR NEXT SESSION: continue with manual mobilizations to L5-S1 area; lumbar and core stabilization  1:48 PM, 02/10/22 Kwadwo Taras Small Maida Widger MPT [facility] [place] #[phone]:[phone]

## 2022-02-15 ENCOUNTER — Ambulatory Visit (HOSPITAL_COMMUNITY): Payer: Medicare HMO

## 2022-02-15 DIAGNOSIS — Q7649 Other congenital malformations of spine, not associated with scoliosis: Secondary | ICD-10-CM

## 2022-02-15 DIAGNOSIS — G8929 Other chronic pain: Secondary | ICD-10-CM | POA: Diagnosis not present

## 2022-02-15 DIAGNOSIS — M545 Low back pain, unspecified: Secondary | ICD-10-CM | POA: Diagnosis not present

## 2022-02-15 NOTE — Therapy (Signed)
OUTPATIENT PHYSICAL THERAPY THORACOLUMBAR DISCHARGE  PHYSICAL THERAPY DISCHARGE SUMMARY  Visits from Start of Care: 7  Current functional level related to goals / functional outcomes: See below   Remaining deficits: See below   Education / Equipment: See below   Patient agrees to discharge. Patient goals were partially met. Patient is being discharged due to being pleased with the current functional level.    Patient Name: Jeremy Mclean MRN: [id number] DOB:07-31-1950, 71 y.o., male Today's Date: 02/15/2022   PT End of Session - 02/15/22 1108     Visit Number 7    Number of Visits 8    Date for PT Re-Evaluation 03/11/22    Authorization Type [employer]    Authorization Time Period please check auth    PT Start Time 1030    PT Stop Time 1108    PT Time Calculation (min) 38 min             Past Medical History:  Diagnosis Date   AAA (abdominal aortic aneurysm) (HCC)    Anxiety    Arthritis    COPD (chronic obstructive pulmonary disease) (HCC)    Degenerative disorder of bone    Depression    Diverticulitis    Dyspnea    due to COPD and when walking long distances per patient   GERD (gastroesophageal reflux disease)    History of kidney stones    Hypertension    Kidney stone    Spinal stenosis    Past Surgical History:  Procedure Laterality Date   ABDOMINAL AORTIC ANEURYSM REPAIR N/A 01/16/2019   Procedure: OPEN REPAIR OF ABDOMINAL AORTIC ANEURYSM;  Surgeon: Rosetta Posner, MD;  Location: [place];  Service: Vascular;  Laterality: N/A;   BIOPSY  01/14/2017   Procedure: BIOPSY;  Surgeon: Danie Binder, MD;  [facility];  Service: Endoscopy;;  gastric    CERVICAL SPINE SURGERY     C6-7 ACDF 09/22/09   COLONOSCOPY N/A 04/09/2014   Procedure: COLONOSCOPY;  Surgeon: Rogene Houston, MD;  [facility];  Service: Endoscopy;  Laterality: N/A;  730 - moved to 7:30 - Ann to notify pt   CYSTOSCOPY     with stent    ESOPHAGOGASTRODUODENOSCOPY N/A 01/14/2017   Procedure: ESOPHAGOGASTRODUODENOSCOPY (EGD);  Surgeon: Danie Binder, MD;  [facility];  Service: Endoscopy;  Laterality: N/A;   kidney stent     Patient Active Problem List   Diagnosis Date Noted   Low back pain 08/21/2019   AAA (abdominal aortic aneurysm) ([place]) 01/16/2019   UGIB (upper gastrointestinal bleed)    Acute blood loss anemia 01/13/2017   Melena 01/13/2017   Precordial chest pain 01/13/2017   Iliac artery aneurysm ([place]) 07/11/2014   Tobacco abuse 07/11/2014   History of colonic polyps 03/18/2014   Abdominal aortic aneurysm ([place]) 03/18/2014   Essential hypertension 03/18/2014    PCP: Asencion Noble, MD  REFERRING PROVIDER: PT eval/tx for 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     PERTINENT HISTORY:  Right knee tibial plateau fx back in April/May Old CVA left side weakness residual  PAIN:  Are you having pain? Yes: NPRS scale: 1 (back); 1-2(right knee)/10 Pain location: low back and right knee Pain description: stabbing Aggravating factors: standing, walking Relieving factors: rest  PRECAUTIONS: None  WEIGHT BEARING RESTRICTIONS:  WBAT right knee  FALLS:  Has patient fallen in last 6 months? Yes. Number of falls  5-6  LIVING ENVIRONMENT: Lives with: lives with their spouse Lives in: House/apartment Stairs: Yes: External: 2 steps; on right going up, on left going up, and can reach both Has following equipment at home: Single point cane, Walker - 2 wheeled, Wheelchair (manual), shower chair, and bed side commode  OCCUPATION: farmer  PLOF: Independent  PATIENT GOALS: decrease pain in my back; walk farther  NEXT MD VISIT:   OBJECTIVE:   DIAGNOSTIC FINDINGS:  CLINICAL DATA:  Meniscal injury, knee tibial fracture   EXAM: MRI OF THE RIGHT KNEE WITHOUT CONTRAST   TECHNIQUE: Multiplanar, multisequence MR imaging of the knee was performed. No intravenous contrast was administered.   COMPARISON:  Knee radiograph 07/28/2021   FINDINGS: MENISCI   Medial: There is a suspected small nondisplaced flap tear of the posterior horn medial meniscus approximately 1.0 cm from the posterior root.   Lateral: Intact.   LIGAMENTS   Cruciates: ACL and PCL are intact.   Collaterals: Medial collateral ligament is intact. Lateral collateral ligament complex is intact.   CARTILAGE   Patellofemoral: Intermediate grade partial-thickness cartilage loss along the upper lateral patellar facet. Mild trochlear chondrosis.   Medial: High-grade and full-thickness cartilage loss along the weight-bearing surfaces. Extensive subchondral marrow edema, predominantly along the medial tibial plateau.   Lateral: Mild chondrosis with focal shallow chondral defect measuring 3 mm in width along the inner lateral tibial plateau posteriorly.   JOINT: Large joint effusion.   POPLITEAL FOSSA: Miniscule Baker's cyst.   EXTENSOR MECHANISM: Intact quadriceps tendon. Intact patellar tendon.   BONES: Extensive marrow edema within the subchondral medial tibial plateau likely related to a subchondral insufficiency fracture. There is additional scattered subchondral marrow edema related to arthritis. No aggressive  osseous lesion. Tricompartment osteophyte formation.   Other: There is soft tissue swelling along the knee. No focal fluid collection.   IMPRESSION: Tricompartment osteoarthritis of the right knee, severe in the medial compartment. Subchondral marrow edema, extensive in the medial tibial plateau which is likely related to a subchondral insufficiency fracture.   Suspected small nondisplaced flap tear of the posterior horn medial meniscus approximately 1.0 cm from the posterior root.   Large joint effusion.     Electronically Signed   By: Maurine Simmering M.D.   On: 09/21/2021 16:29  PATIENT SURVEYS:  FOTO 45    COGNITION: Overall cognitive status: Within functional limits for tasks assessed     SENSATION: WFL    POSTURE: flexed trunk , weight shift left, and guarded posturing  PALPATION: Tender L5-S1 paraspinals  LUMBAR ROM:   AROM eval 02/11/2379  Flexion 75% available 80%  available  Extension 30% available ** 60% available no pain  Right lateral flexion    Left lateral flexion    Right rotation    Left rotation     (Blank rows = not tested)   LOWER EXTREMITY  MMT:    MMT Right eval Left eval Right 02/10/22 Left 02/10/22  Hip flexion 4+ 4- 5 5  Hip extension      Hip abduction      Hip adduction      Hip internal rotation      Hip external rotation      Knee flexion      Knee extension 3- 4+ 4+ 5  Ankle dorsiflexion 4+ 4+ 5 5  Ankle plantarflexion      Ankle inversion      Ankle eversion       (Blank rows = not tested)  FUNCTIONAL TESTS:  5 times sit to stand: 15.54   TODAY'S TREATMENT:                                                                                                                              DATE:  02/15/22 Accompianied by daughter Lattie Haw today Manual distraction 3 x 10  2 MWT 393 ft Sitting chair push up x 5 to try to distract pelvis Review of HEP   02/10/22 Progress note 5 times sit to stand:10.8 sec MMT's FOTO  61 Review of HEP  Prone lying manual distraction L5-S1  3 sets of 12   02/01/22 Prone lying manual distraction L5-S1  3 sets of 12   01/26/22 Supine: LTR x 20 Transverse abdominus 5" x 20 Transverse abdominus with hip adduction with ball 5" x 10 Transverse abdominus with march x 10 Bridge x 10 Hooklying position 4# ball flexion x 10  Lat pull 50# x 8  Prone lying manual distraction L5-S1  3 sets of 8  01/24/22 Supine: LTR x 20 Transverse abdominus 5" x 20 Transverse abdominus with march x 10 Bridge x 10 Hooklying position 4# ball flexion x 10  Prone: Manual distraction L5-S1 3 sets of 8  Lat pulls 40# x 8    01/21/22 PT Treatment session -L5-S1 Lumbar manual therapy-anterior distraction -Supine LTR for 3-4sec hold x 30 -TA bracing 5sec hold, tactile cues with B hands -TA bracing with B marching supine cues for breathing.     01/05/22 physical therapy evaluation and HEP instruction    Education details: Patient educated on exam findings, POC, scope of PT, HEP. Person educated: Patient Education method: Explanation, Demonstration, and Handouts Education comprehension: verbalized understanding, returned demonstration, verbal cues required, and tactile cues required  HOME EXERCISE PROGRAM: Access Code: [email or web address] URL: [email or web address] Date: 02/15/2022 Prepared by: [facility]  Exercises - Supine Lower Trunk Rotation  - 2 x daily - 7 x weekly - 1 sets - 10 reps - Supine Transversus Abdominis Bracing - Hands on Stomach  - 2 x daily - 7 x weekly - 1 sets - 10 reps - Supine Bridge  - 1 x daily - 7 x weekly - 3 sets - 10 reps - Supine March  - 1 x daily - 7 x weekly - 3 sets - 10 reps  Access Code: [email or web address] URL: [email or web address] Date: 01/05/2022 Prepared by: [facility]  Exercises - Supine Lower Trunk Rotation  - 2 x daily - 7 x weekly - 1 sets - 10 reps - Supine Transversus Abdominis Bracing - Hands on Stomach  - 2 x  daily - 7 x weekly - 1 sets - 10 reps  ASSESSMENT:  CLINICAL IMPRESSION:  Discharge visit today; patient's daughter present to learn manual lumbar distraction technique.  Patient pleased with current level and agreeable to discharge today.     Eval:Patient is a 71 y.o. 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 Patient demonstrates muscle weakness, reduced ROM, and fascial restrictions which are likely contributing to symptoms of pain and are negatively impacting patient ability to perform ADLs and functional mobility tasks. Patient will benefit from skilled physical therapy services to address these deficits to reduce pain and improve level of function with ADLs and functional mobility tasks.   OBJECTIVE IMPAIRMENTS: Abnormal gait, decreased activity tolerance, decreased balance, decreased endurance, decreased mobility, difficulty walking, decreased ROM, decreased strength, hypomobility, increased edema, increased fascial restrictions, impaired perceived functional ability, impaired flexibility, postural dysfunction, and pain.   ACTIVITY LIMITATIONS: carrying, lifting, bending, sitting, standing, squatting, sleeping, stairs, transfers, bathing, locomotion level, and caring for others  PARTICIPATION LIMITATIONS: meal prep, cleaning, laundry, shopping, community activity, and yard work  PERSONAL FACTORS:  recent right knee injury  are also affecting patient's functional outcome.   REHAB POTENTIAL: Good  CLINICAL DECISION MAKING: Stable/uncomplicated  EVALUATION COMPLEXITY: Low   GOALS: Goals reviewed with patient? Yes  SHORT TERM GOALS: Target date: 01/19/2022  Patient will be independent with initial HEP  Baseline: Goal status: MET  2.   Patient will report at least 50% improvement in overall symptoms and/or function to demonstrate improved functional mobility  Baseline:   Goal status: MET  LONG TERM GOALS: Target date: 03/11/2021  Patient will be independent in self management strategies to improve quality of life and functional outcomes.   Baseline:  Goal status: MET  2.  Patient will improve FOTO score to predicted value  Baseline: 45 Goal status: MET  3.  Patient will increase bilateral lower extremity MMTs to 4+-5/5 without pain to promote return to ambulation community distances with minimal deviation.  Baseline: see above Goal status: MET  4.  Patient will be able to walk x 15 min with back pain no more than 2/10 to improve ability to perform a light shopping trip. Baseline:  Goal status: IN PROGRESS  5.   Patient will report at least 75% improvement in overall symptoms and/or function to demonstrate improved functional mobility  Baseline:  Goal status: IN PROGRESS  PLAN:  PT FREQUENCY: 1-2x/week  PT DURATION: 4 weeks  Therapeutic exercises, Therapeutic activity, Neuromuscular re-education, Balance training, Gait training, Patient/Family education, Joint manipulation, Joint mobilization, Stair training, Orthotic/Fit training, DME instructions, Aquatic Therapy, Dry Needling, Electrical stimulation, Spinal manipulation, Spinal mobilization, Cryotherapy, Moist heat, Compression bandaging, scar mobilization, Splintting, Taping, Traction, Ultrasound, Ionotophoresis 50m/ml Dexamethasone, and Manual therapy   PLAN FOR NEXT SESSION: discharge  11:09 AM, 02/15/22 Charnelle Bergeman Small Jasenia Weilbacher MPT [facility] [place] #[phone]

## 2022-02-17 ENCOUNTER — Encounter (HOSPITAL_COMMUNITY): Payer: Medicare HMO

## 2022-02-20 DIAGNOSIS — E785 Hyperlipidemia, unspecified: Secondary | ICD-10-CM | POA: Diagnosis not present

## 2022-02-20 DIAGNOSIS — I10 Essential (primary) hypertension: Secondary | ICD-10-CM | POA: Diagnosis not present

## 2022-02-20 DIAGNOSIS — K219 Gastro-esophageal reflux disease without esophagitis: Secondary | ICD-10-CM | POA: Diagnosis not present

## 2022-03-03 ENCOUNTER — Ambulatory Visit (INDEPENDENT_AMBULATORY_CARE_PROVIDER_SITE_OTHER): Payer: Medicare HMO

## 2022-03-03 ENCOUNTER — Ambulatory Visit: Payer: Medicare HMO | Admitting: Orthopedic Surgery

## 2022-03-03 DIAGNOSIS — M1711 Unilateral primary osteoarthritis, right knee: Secondary | ICD-10-CM

## 2022-03-03 DIAGNOSIS — M5136 Other intervertebral disc degeneration, lumbar region: Secondary | ICD-10-CM | POA: Insufficient documentation

## 2022-03-03 DIAGNOSIS — G8929 Other chronic pain: Secondary | ICD-10-CM

## 2022-03-03 DIAGNOSIS — M25561 Pain in right knee: Secondary | ICD-10-CM | POA: Diagnosis not present

## 2022-03-03 DIAGNOSIS — M47816 Spondylosis without myelopathy or radiculopathy, lumbar region: Secondary | ICD-10-CM | POA: Insufficient documentation

## 2022-03-03 DIAGNOSIS — Q7649 Other congenital malformations of spine, not associated with scoliosis: Secondary | ICD-10-CM | POA: Insufficient documentation

## 2022-03-03 DIAGNOSIS — M48061 Spinal stenosis, lumbar region without neurogenic claudication: Secondary | ICD-10-CM | POA: Insufficient documentation

## 2022-03-03 NOTE — Progress Notes (Signed)
Follow-up right knee status post closed fracture right tibial plateau treated with brace and protected weightbearing and then progressive range of motion exercises  Patient has returned to most of his normal activities  He has regained good range of motion in his knee he can walk without support he has a slight limp  There is no effusion to the joint  X-rays show that the fracture healed in good position he has some arthritis in the patellofemoral joint and the medial compartment which is mild to moderate  He can resume all normal activities follow-up as needed

## 2022-03-29 ENCOUNTER — Encounter: Payer: Self-pay | Admitting: *Deleted

## 2022-03-29 DIAGNOSIS — J440 Chronic obstructive pulmonary disease with acute lower respiratory infection: Secondary | ICD-10-CM | POA: Diagnosis not present

## 2022-03-29 DIAGNOSIS — M545 Low back pain, unspecified: Secondary | ICD-10-CM | POA: Diagnosis not present

## 2022-03-29 DIAGNOSIS — R059 Cough, unspecified: Secondary | ICD-10-CM | POA: Diagnosis not present

## 2022-03-29 DIAGNOSIS — I10 Essential (primary) hypertension: Secondary | ICD-10-CM | POA: Diagnosis not present

## 2022-06-01 DIAGNOSIS — J440 Chronic obstructive pulmonary disease with acute lower respiratory infection: Secondary | ICD-10-CM | POA: Diagnosis not present

## 2022-06-01 DIAGNOSIS — I10 Essential (primary) hypertension: Secondary | ICD-10-CM | POA: Diagnosis not present

## 2022-06-01 DIAGNOSIS — M545 Low back pain, unspecified: Secondary | ICD-10-CM | POA: Diagnosis not present

## 2022-06-13 DIAGNOSIS — M47816 Spondylosis without myelopathy or radiculopathy, lumbar region: Secondary | ICD-10-CM | POA: Diagnosis not present

## 2022-06-13 DIAGNOSIS — M48061 Spinal stenosis, lumbar region without neurogenic claudication: Secondary | ICD-10-CM | POA: Diagnosis not present

## 2022-06-13 DIAGNOSIS — Z6831 Body mass index (BMI) 31.0-31.9, adult: Secondary | ICD-10-CM | POA: Diagnosis not present

## 2022-06-16 ENCOUNTER — Other Ambulatory Visit (HOSPITAL_COMMUNITY): Payer: Self-pay | Admitting: Neurological Surgery

## 2022-06-16 DIAGNOSIS — M48061 Spinal stenosis, lumbar region without neurogenic claudication: Secondary | ICD-10-CM

## 2022-07-13 ENCOUNTER — Ambulatory Visit (HOSPITAL_COMMUNITY)
Admission: RE | Admit: 2022-07-13 | Discharge: 2022-07-13 | Disposition: A | Discharge status: Home / Self Care | Payer: Medicare PPO | Source: Ambulatory Visit | Attending: Neurological Surgery | Admitting: Neurological Surgery

## 2022-07-13 DIAGNOSIS — M545 Low back pain, unspecified: Secondary | ICD-10-CM | POA: Diagnosis not present

## 2022-07-13 DIAGNOSIS — M4316 Spondylolisthesis, lumbar region: Secondary | ICD-10-CM | POA: Diagnosis not present

## 2022-07-13 DIAGNOSIS — M48061 Spinal stenosis, lumbar region without neurogenic claudication: Secondary | ICD-10-CM | POA: Diagnosis not present

## 2022-07-22 DIAGNOSIS — M48061 Spinal stenosis, lumbar region without neurogenic claudication: Secondary | ICD-10-CM | POA: Diagnosis not present

## 2022-07-22 DIAGNOSIS — M47816 Spondylosis without myelopathy or radiculopathy, lumbar region: Secondary | ICD-10-CM | POA: Diagnosis not present

## 2022-07-22 DIAGNOSIS — Z6832 Body mass index (BMI) 32.0-32.9, adult: Secondary | ICD-10-CM | POA: Diagnosis not present

## 2022-08-01 DIAGNOSIS — M48062 Spinal stenosis, lumbar region with neurogenic claudication: Secondary | ICD-10-CM | POA: Diagnosis not present

## 2022-09-08 DIAGNOSIS — Z125 Encounter for screening for malignant neoplasm of prostate: Secondary | ICD-10-CM | POA: Diagnosis not present

## 2022-09-08 DIAGNOSIS — Z79899 Other long term (current) drug therapy: Secondary | ICD-10-CM | POA: Diagnosis not present

## 2022-09-08 DIAGNOSIS — M5186 Other intervertebral disc disorders, lumbar region: Secondary | ICD-10-CM | POA: Diagnosis not present

## 2022-09-08 DIAGNOSIS — I10 Essential (primary) hypertension: Secondary | ICD-10-CM | POA: Diagnosis not present

## 2022-09-08 DIAGNOSIS — J449 Chronic obstructive pulmonary disease, unspecified: Secondary | ICD-10-CM | POA: Diagnosis not present

## 2022-09-14 DIAGNOSIS — M48062 Spinal stenosis, lumbar region with neurogenic claudication: Secondary | ICD-10-CM | POA: Diagnosis not present

## 2022-09-14 DIAGNOSIS — Z6832 Body mass index (BMI) 32.0-32.9, adult: Secondary | ICD-10-CM | POA: Diagnosis not present

## 2022-09-14 DIAGNOSIS — M48061 Spinal stenosis, lumbar region without neurogenic claudication: Secondary | ICD-10-CM | POA: Diagnosis not present

## 2022-09-15 DIAGNOSIS — K219 Gastro-esophageal reflux disease without esophagitis: Secondary | ICD-10-CM | POA: Diagnosis not present

## 2022-09-15 DIAGNOSIS — I714 Abdominal aortic aneurysm, without rupture, unspecified: Secondary | ICD-10-CM | POA: Diagnosis not present

## 2022-09-15 DIAGNOSIS — Z0001 Encounter for general adult medical examination with abnormal findings: Secondary | ICD-10-CM | POA: Diagnosis not present

## 2022-09-15 DIAGNOSIS — E785 Hyperlipidemia, unspecified: Secondary | ICD-10-CM | POA: Diagnosis not present

## 2022-09-15 DIAGNOSIS — J440 Chronic obstructive pulmonary disease with acute lower respiratory infection: Secondary | ICD-10-CM | POA: Diagnosis not present

## 2022-09-15 DIAGNOSIS — A062 Amebic nondysenteric colitis: Secondary | ICD-10-CM | POA: Diagnosis not present

## 2022-09-15 DIAGNOSIS — M47896 Other spondylosis, lumbar region: Secondary | ICD-10-CM | POA: Diagnosis not present

## 2022-09-15 DIAGNOSIS — I10 Essential (primary) hypertension: Secondary | ICD-10-CM | POA: Diagnosis not present

## 2022-09-20 DIAGNOSIS — M48062 Spinal stenosis, lumbar region with neurogenic claudication: Secondary | ICD-10-CM | POA: Diagnosis not present

## 2022-09-27 ENCOUNTER — Encounter: Payer: Self-pay | Admitting: *Deleted

## 2022-10-19 DIAGNOSIS — M48062 Spinal stenosis, lumbar region with neurogenic claudication: Secondary | ICD-10-CM | POA: Diagnosis not present

## 2022-10-19 DIAGNOSIS — Q7649 Other congenital malformations of spine, not associated with scoliosis: Secondary | ICD-10-CM | POA: Diagnosis not present

## 2022-10-19 DIAGNOSIS — M5136 Other intervertebral disc degeneration, lumbar region: Secondary | ICD-10-CM | POA: Diagnosis not present

## 2022-10-26 DIAGNOSIS — H0288A Meibomian gland dysfunction right eye, upper and lower eyelids: Secondary | ICD-10-CM | POA: Diagnosis not present

## 2022-10-26 DIAGNOSIS — H0288B Meibomian gland dysfunction left eye, upper and lower eyelids: Secondary | ICD-10-CM | POA: Diagnosis not present

## 2022-11-04 NOTE — Therapy (Signed)
OUTPATIENT PHYSICAL THERAPY THORACOLUMBAR EVALUATION   Patient Name: Jeremy Mclean MRN: [id number] DOB:1950-09-12, 72 y.o., male Today's Date: 11/08/2022  END OF SESSION:  PT End of Session - 11/08/22 1303     Visit Number 1    Number of Visits 8    Date for PT Re-Evaluation 12/09/22    Authorization Type [employer]; auth submitted 9/17 please check    PT Start Time 1300    PT Stop Time 1340    PT Time Calculation (min) 40 min    Activity Tolerance Patient tolerated treatment well    Behavior During Therapy WFL for tasks assessed/performed             Past Medical History:  Diagnosis Date   AAA (abdominal aortic aneurysm) (HCC)    Anxiety    Arthritis    COPD (chronic obstructive pulmonary disease) (HCC)    Degenerative disorder of bone    Depression    Diverticulitis    Dyspnea    due to COPD and when walking long distances per patient   GERD (gastroesophageal reflux disease)    History of kidney stones    Hypertension    Kidney stone    Spinal stenosis    Past Surgical History:  Procedure Laterality Date   ABDOMINAL AORTIC ANEURYSM REPAIR N/A 01/16/2019   Procedure: OPEN REPAIR OF ABDOMINAL AORTIC ANEURYSM;  Surgeon: Larina Earthly, MD;  Location: [facility];  Service: Vascular;  Laterality: N/A;   BIOPSY  01/14/2017   Procedure: BIOPSY;  Surgeon: West Bali, MD;  Location: [facility];  Service: Endoscopy;;  gastric    CERVICAL SPINE SURGERY     C6-7 ACDF 09/22/09   COLONOSCOPY N/A 04/09/2014   Procedure: COLONOSCOPY;  Surgeon: Malissa Hippo, MD;  Location: [facility];  Service: Endoscopy;  Laterality: N/A;  730 - moved to 7:30 - Ann to notify pt   CYSTOSCOPY     with stent   ESOPHAGOGASTRODUODENOSCOPY N/A 01/14/2017   Procedure: ESOPHAGOGASTRODUODENOSCOPY (EGD);  Surgeon: West Bali, MD;  Location: [facility];  Service: Endoscopy;  Laterality: N/A;   kidney stent     Patient Active Problem List   Diagnosis Date Noted   Lumbar foraminal  stenosis 03/03/2022   Sacralization of lumbar vertebra 03/03/2022   Degenerative lumbar spinal stenosis 03/03/2022   Degeneration of lumbar intervertebral disc 03/03/2022   Lumbar spondylosis 03/03/2022   Arthropathy of lumbar facet joint 03/03/2022   Low back pain 08/21/2019   AAA (abdominal aortic aneurysm) (HCC) 01/16/2019   UGIB (upper gastrointestinal bleed)    Acute blood loss anemia 01/13/2017   Melena 01/13/2017   Precordial chest pain 01/13/2017   Neck pain 08/19/2015   Displacement of cervical intervertebral disc without myelopathy 08/19/2015   Cervical radiculopathy 08/19/2015   Iliac artery aneurysm (HCC) 07/11/2014   Tobacco abuse 07/11/2014   History of colonic polyps 03/18/2014   Abdominal aortic aneurysm (HCC) 03/18/2014   Essential hypertension 03/18/2014    PCP: Carylon Perches, MD  REFERRING PROVIDER: Clovis Riley, PA-C  REFERRING DIAG: [phone] (ICD-10-CM) - Spinal stenosis, lumbar region with neurogenic claudication  Rationale for Evaluation and Treatment: Rehabilitation  THERAPY DIAG:  Chronic low back pain, unspecified back pain laterality, unspecified whether sciatica present  Bertolotti's syndrome  ONSET DATE: chronic ongoing back pain  SUBJECTIVE:  SUBJECTIVE STATEMENT: Has had 3 injections in his back; has not helped; daughter works 12 hour days and so he hates to ask her to come to push on his back.  Very depressed because he cannot work outside like he wants to.  Has been here for therapy a couple times  PERTINENT HISTORY:  Right knee fracture last year  PAIN:  Are you having pain? Yes: NPRS scale: 1-10/10 Pain location: low back Pain description: low back and right hip Aggravating factors: standing, walking Relieving factors: sitting,  rest  PRECAUTIONS: Fall    WEIGHT BEARING RESTRICTIONS: No  FALLS:  Has patient fallen in last 6 months? Yes. Number of falls 1 tripped outside  OCCUPATION: retired and active on his farm  PLOF: Independent  PATIENT GOALS: less pain  NEXT MD VISIT: PRN  OBJECTIVE:   DIAGNOSTIC FINDINGS:  CLINICAL DATA:  Foraminal stenosis of lumbar region   EXAM: MRI LUMBAR SPINE WITHOUT CONTRAST   TECHNIQUE: Multiplanar, multisequence MR imaging of the lumbar spine was performed. No intravenous contrast was administered.   COMPARISON:  03/12/2021   FINDINGS: Segmentation: 5 lumbar type vertebral bodies. Pseudoarticulation of elongated L5 transverse processes with the sacral ala.   Alignment: Dextrocurvature. Trace retrolisthesis of L2 on L3 and L3 on L4, unchanged. Trace anterolisthesis of L4 on L5, unchanged.   Vertebrae:  L1   Conus medullaris and cauda equina: Conus extends to the No acute fracture, evidence of discitis, or suspicious osseous lesion. level. Conus and cauda equina appear normal.   Paraspinal and other soft tissues: The infrarenal abdominal aorta measures up to 3 cm (series 5, image 9). Aortic atherosclerosis. Multiple renal cysts, for which no follow-up is currently indicated. No lymphadenopathy.   Disc levels:   T12-L1: No significant disc bulge. No spinal canal stenosis or neural foraminal narrowing.   L1-L2: Minimal disc bulge. Mild facet arthropathy. No spinal canal stenosis or neural foraminal narrowing.   L2-L3: Trace retrolisthesis and mild disc bulge with superimposed right foraminal extrusion with 6 mm of cranial migration, new from the prior exam. Moderate facet arthropathy. Narrowing of the lateral recesses. Ligamentum flavum hypertrophy. Mild-to-moderate spinal canal stenosis, unchanged. Moderate right neural foraminal narrowing, progressed from the prior exam.   L3-L4: Trace retrolisthesis with mild disc bulge and superimposed right  subarticular disc extrusion with 7 mm of caudal migration, unchanged. This likely contacts the descending right L4 nerve roots. Moderate facet arthropathy. Ligamentum flavum hypertrophy. Narrowing of the lateral recesses. Mild spinal canal stenosis. Moderate left neural foraminal narrowing, unchanged.   L4-L5: Trace anterolisthesis with disc unroofing and mild disc bulge. Severe facet arthropathy. No spinal canal stenosis or neural foraminal narrowing.   L5-S1: Mild disc bulge with superimposed right paracentral protrusion. Moderate facet arthropathy. No spinal canal stenosis. Mild right neural foraminal narrowing, unchanged.   IMPRESSION: 1. L2-L3 mild-to-moderate spinal canal stenosis, unchanged, and moderate right neural foraminal narrowing, which has progressed from the prior exam. Narrowing of the lateral recesses at this level could affect the descending L3 nerve roots. 2. L3-L4 mild spinal canal stenosis and moderate left neural foraminal narrowing, unchanged. Narrowing of the lateral recesses at this level could affect the descending L4 nerve roots. In addition, a disc extrusion at this level likely contacts the descending right L4 nerve roots. 3. L5-S1 mild right neural foraminal narrowing. 4. Multilevel facet arthropathy, which is severe at L4-L5, which can be a cause of back pain.     Electronically Signed   By: Elaina Pattee.D.  On: 07/22/2022 02:13  PATIENT SURVEYS:  FOTO 45  COGNITION: Overall cognitive status: Within functional limits for tasks assessed     SENSATION: WFL  POSTURE: rounded shoulders, forward head, and flexed trunk   PALPATION: Tenderness L3-S1 area  LUMBAR ROM:   AROM eval  Flexion 65% available  Extension 15% available  Right lateral flexion   Left lateral flexion   Right rotation   Left rotation    (Blank rows = not tested)  LOWER EXTREMITY ROM:     Active  Right eval Left eval  Hip flexion    Hip extension    Hip  abduction    Hip adduction    Hip internal rotation    Hip external rotation    Knee flexion    Knee extension    Ankle dorsiflexion    Ankle plantarflexion    Ankle inversion    Ankle eversion     (Blank rows = not tested)  LOWER EXTREMITY MMT:    MMT Right eval Left eval  Hip flexion 4 4+  Hip extension 3- 3-  Hip abduction    Hip adduction    Hip internal rotation    Hip external rotation    Knee flexion 4+ 4+  Knee extension 4+ 5  Ankle dorsiflexion 4+ 5  Ankle plantarflexion    Ankle inversion    Ankle eversion     (Blank rows = not tested)  FUNCTIONAL TESTS:  5 times sit to stand: 12.87 sec pushing up on knees some  GAIT: Distance walked: 50 ft in clinic Assistive device utilized: None Level of assistance: Modified independence Comments: forward flexed trunk  TODAY'S TREATMENT:                                                                                                                              DATE: 11/08/22 physical therapy evaluation and HEP instruction    PATIENT EDUCATION:  Education details: Patient educated on exam findings, POC, scope of PT, HEP, and what to expect next visit. Person educated: Patient Education method: Explanation, Demonstration, and Handouts Education comprehension: verbalized understanding, returned demonstration, verbal cues required, and tactile cues required  HOME EXERCISE PROGRAM: Access Code: [email or web address] URL: [email or web address] Date: 11/08/2022 Prepared by: [facility]  Exercises - Supine Lower Trunk Rotation  - 2 x daily - 7 x weekly - 1 sets - 10 reps - Supine Transversus Abdominis Bracing - Hands on Stomach  - 2 x daily - 7 x weekly - 1 sets - 10 reps - Supine Bridge  - 1 x daily - 7 x weekly - 3 sets - 10 reps - Supine March  - 1 x daily - 7 x weekly - 3 sets - 10 reps  ASSESSMENT:  CLINICAL IMPRESSION: Patient is a 72 y.o. male who was seen today for physical therapy evaluation and treatment  for M48.062 (ICD-10-CM) - Spinal stenosis, lumbar region with neurogenic claudication.  Patient demonstrates muscle  weakness, reduced ROM, and fascial restrictions which are likely contributing to symptoms of pain and are negatively impacting patient ability to perform ADLs and functional mobility tasks. Patient will benefit from skilled physical therapy services to address these deficits to reduce pain and improve level of function with ADLs and functional mobility tasks.   OBJECTIVE IMPAIRMENTS: Abnormal gait, decreased activity tolerance, difficulty walking, decreased ROM, decreased strength, impaired perceived functional ability, and pain.   ACTIVITY LIMITATIONS: carrying, lifting, bending, sitting, standing, squatting, sleeping, stairs, and locomotion level  PARTICIPATION LIMITATIONS: meal prep, cleaning, laundry, community activity, and yard work  [facility] POTENTIAL: Good  CLINICAL DECISION MAKING: Stable/uncomplicated  EVALUATION COMPLEXITY: Low   GOALS: Goals reviewed with patient? No  SHORT TERM GOALS: Target date: 11/22/2022  patient will be independent with initial HEP  Baseline: Goal status: INITIAL  2.  Patient will self report 50% improvement to improve tolerance for functional activity  Baseline:  Goal status: INITIAL  LONG TERM GOALS: Target date: 12/09/2022  Patient will be independent in self management strategies to improve quality of life and functional outcomes.   Baseline:  Goal status: INITIAL  2.  Patient will self report 50% improvement to improve tolerance for functional activity  Baseline:  Goal status: INITIAL  3.  Patient will improve 5 times sit to stand score from 12.87 sec to 11 sec to demonstrate improved functional mobility and increased lower extremity strength.   Baseline:  Goal status: INITIAL  4.  Patient will increase  leg MMTs to 4+ to 5/5 without pain to promote return to ambulation community distances with minimal  deviation.  Baseline:  Goal status: INITIAL  5.  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 Baseline:  Goal status: INITIAL   PLAN:  PT FREQUENCY: 2x/week  PT DURATION: 4 weeks  PLANNED INTERVENTIONS: Therapeutic exercises, Therapeutic activity, Neuromuscular re-education, Balance training, Gait training, Patient/Family education, Joint manipulation, Joint mobilization, Stair training, Orthotic/Fit training, DME instructions, Aquatic Therapy, Dry Needling, Electrical stimulation, Spinal manipulation, Spinal mobilization, Cryotherapy, Moist heat, Compression bandaging, scar mobilization, Splintting, Taping, Traction, Ultrasound, Ionotophoresis 4mg /ml Dexamethasone, and Manual therapy .  PLAN FOR NEXT SESSION: Review HEP and goals;    2:01 PM, 11/08/22 Bita Cartwright Small Dalesha Stanback MPT [facility] [place] [phone]

## 2022-11-08 ENCOUNTER — Other Ambulatory Visit: Payer: Self-pay

## 2022-11-08 ENCOUNTER — Ambulatory Visit (HOSPITAL_COMMUNITY): Payer: Medicare PPO | Attending: Surgery

## 2022-11-08 DIAGNOSIS — Q7649 Other congenital malformations of spine, not associated with scoliosis: Secondary | ICD-10-CM | POA: Insufficient documentation

## 2022-11-08 DIAGNOSIS — M545 Low back pain, unspecified: Secondary | ICD-10-CM | POA: Diagnosis not present

## 2022-11-08 DIAGNOSIS — G8929 Other chronic pain: Secondary | ICD-10-CM | POA: Insufficient documentation

## 2022-11-25 ENCOUNTER — Ambulatory Visit (HOSPITAL_COMMUNITY): Payer: Medicare HMO | Attending: Surgery

## 2022-11-25 DIAGNOSIS — M545 Low back pain, unspecified: Secondary | ICD-10-CM | POA: Insufficient documentation

## 2022-11-25 DIAGNOSIS — G8929 Other chronic pain: Secondary | ICD-10-CM | POA: Insufficient documentation

## 2022-11-25 DIAGNOSIS — Q7649 Other congenital malformations of spine, not associated with scoliosis: Secondary | ICD-10-CM | POA: Insufficient documentation

## 2022-11-25 NOTE — Therapy (Signed)
OUTPATIENT PHYSICAL THERAPY THORACOLUMBAR TREATMENT   Patient Name: Jeremy Mclean MRN: [id number] DOB:04-19-1950, 72 y.o., male Today's Date: 11/25/2022  END OF SESSION:  PT End of Session - 11/25/22 1054     Visit Number 2    Number of Visits 8    Date for PT Re-Evaluation 12/09/22    Authorization Type [employer]    Authorization Time Period 8 visits approved 9/16 to 10/18    PT Start Time 1054    PT Stop Time 1140    PT Time Calculation (min) 46 min    Activity Tolerance Patient tolerated treatment well    Behavior During Therapy WFL for tasks assessed/performed             Past Medical History:  Diagnosis Date   AAA (abdominal aortic aneurysm) (HCC)    Anxiety    Arthritis    COPD (chronic obstructive pulmonary disease) (HCC)    Degenerative disorder of bone    Depression    Diverticulitis    Dyspnea    due to COPD and when walking long distances per patient   GERD (gastroesophageal reflux disease)    History of kidney stones    Hypertension    Kidney stone    Spinal stenosis    Past Surgical History:  Procedure Laterality Date   ABDOMINAL AORTIC ANEURYSM REPAIR N/A 01/16/2019   Procedure: OPEN REPAIR OF ABDOMINAL AORTIC ANEURYSM;  Surgeon: Larina Earthly, MD;  Location: [facility];  Service: Vascular;  Laterality: N/A;   BIOPSY  01/14/2017   Procedure: BIOPSY;  Surgeon: West Bali, MD;  Location: [facility];  Service: Endoscopy;;  gastric    CERVICAL SPINE SURGERY     C6-7 ACDF 09/22/09   COLONOSCOPY N/A 04/09/2014   Procedure: COLONOSCOPY;  Surgeon: Malissa Hippo, MD;  Location: [facility];  Service: Endoscopy;  Laterality: N/A;  730 - moved to 7:30 - Ann to notify pt   CYSTOSCOPY     with stent   ESOPHAGOGASTRODUODENOSCOPY N/A 01/14/2017   Procedure: ESOPHAGOGASTRODUODENOSCOPY (EGD);  Surgeon: West Bali, MD;  Location: [facility];  Service: Endoscopy;  Laterality: N/A;   kidney stent     Patient Active Problem List   Diagnosis Date Noted    Lumbar foraminal stenosis 03/03/2022   Sacralization of lumbar vertebra 03/03/2022   Degenerative lumbar spinal stenosis 03/03/2022   Degeneration of lumbar intervertebral disc 03/03/2022   Lumbar spondylosis 03/03/2022   Arthropathy of lumbar facet joint 03/03/2022   Low back pain 08/21/2019   AAA (abdominal aortic aneurysm) (HCC) 01/16/2019   UGIB (upper gastrointestinal bleed)    Acute blood loss anemia 01/13/2017   Melena 01/13/2017   Precordial chest pain 01/13/2017   Neck pain 08/19/2015   Displacement of cervical intervertebral disc without myelopathy 08/19/2015   Cervical radiculopathy 08/19/2015   Iliac artery aneurysm (HCC) 07/11/2014   Tobacco abuse 07/11/2014   History of colonic polyps 03/18/2014   Abdominal aortic aneurysm (HCC) 03/18/2014   Essential hypertension 03/18/2014    PCP: Carylon Perches, MD  REFERRING PROVIDER: Clovis Riley, PA-C  REFERRING DIAG: [phone] (ICD-10-CM) - Spinal stenosis, lumbar region with neurogenic claudication  Rationale for Evaluation and Treatment: Rehabilitation  THERAPY DIAG:  Chronic low back pain, unspecified back pain laterality, unspecified whether sciatica present  Bertolotti's syndrome  ONSET DATE: chronic ongoing back pain  SUBJECTIVE:  SUBJECTIVE STATEMENT: Has been waking up to night due to bad dreams; generally not sleeping well; felt better after last visit but only lasted a day or 2.  2/10 pain in his back today  PERTINENT HISTORY:  Right knee fracture last year  PAIN:  Are you having pain? Yes: NPRS scale: 1-10/10 Pain location: low back Pain description: low back and right hip Aggravating factors: standing, walking Relieving factors: sitting, rest  PRECAUTIONS: Fall    WEIGHT BEARING RESTRICTIONS: No  FALLS:   Has patient fallen in last 6 months? Yes. Number of falls 1 tripped outside  OCCUPATION: retired and active on his farm  PLOF: Independent  PATIENT GOALS: less pain  NEXT MD VISIT: PRN  OBJECTIVE:   DIAGNOSTIC FINDINGS:  CLINICAL DATA:  Foraminal stenosis of lumbar region   EXAM: MRI LUMBAR SPINE WITHOUT CONTRAST   TECHNIQUE: Multiplanar, multisequence MR imaging of the lumbar spine was performed. No intravenous contrast was administered.   COMPARISON:  03/12/2021   FINDINGS: Segmentation: 5 lumbar type vertebral bodies. Pseudoarticulation of elongated L5 transverse processes with the sacral ala.   Alignment: Dextrocurvature. Trace retrolisthesis of L2 on L3 and L3 on L4, unchanged. Trace anterolisthesis of L4 on L5, unchanged.   Vertebrae:  L1   Conus medullaris and cauda equina: Conus extends to the No acute fracture, evidence of discitis, or suspicious osseous lesion. level. Conus and cauda equina appear normal.   Paraspinal and other soft tissues: The infrarenal abdominal aorta measures up to 3 cm (series 5, image 9). Aortic atherosclerosis. Multiple renal cysts, for which no follow-up is currently indicated. No lymphadenopathy.   Disc levels:   T12-L1: No significant disc bulge. No spinal canal stenosis or neural foraminal narrowing.   L1-L2: Minimal disc bulge. Mild facet arthropathy. No spinal canal stenosis or neural foraminal narrowing.   L2-L3: Trace retrolisthesis and mild disc bulge with superimposed right foraminal extrusion with 6 mm of cranial migration, new from the prior exam. Moderate facet arthropathy. Narrowing of the lateral recesses. Ligamentum flavum hypertrophy. Mild-to-moderate spinal canal stenosis, unchanged. Moderate right neural foraminal narrowing, progressed from the prior exam.   L3-L4: Trace retrolisthesis with mild disc bulge and superimposed right subarticular disc extrusion with 7 mm of caudal migration, unchanged. This  likely contacts the descending right L4 nerve roots. Moderate facet arthropathy. Ligamentum flavum hypertrophy. Narrowing of the lateral recesses. Mild spinal canal stenosis. Moderate left neural foraminal narrowing, unchanged.   L4-L5: Trace anterolisthesis with disc unroofing and mild disc bulge. Severe facet arthropathy. No spinal canal stenosis or neural foraminal narrowing.   L5-S1: Mild disc bulge with superimposed right paracentral protrusion. Moderate facet arthropathy. No spinal canal stenosis. Mild right neural foraminal narrowing, unchanged.   IMPRESSION: 1. L2-L3 mild-to-moderate spinal canal stenosis, unchanged, and moderate right neural foraminal narrowing, which has progressed from the prior exam. Narrowing of the lateral recesses at this level could affect the descending L3 nerve roots. 2. L3-L4 mild spinal canal stenosis and moderate left neural foraminal narrowing, unchanged. Narrowing of the lateral recesses at this level could affect the descending L4 nerve roots. In addition, a disc extrusion at this level likely contacts the descending right L4 nerve roots. 3. L5-S1 mild right neural foraminal narrowing. 4. Multilevel facet arthropathy, which is severe at L4-L5, which can be a cause of back pain.     Electronically Signed   By: Wiliam Ke M.D.   On: 07/22/2022 02:13  PATIENT SURVEYS:  FOTO 45  COGNITION: Overall cognitive status: Within  functional limits for tasks assessed     SENSATION: WFL  POSTURE: rounded shoulders, forward head, and flexed trunk   PALPATION: Tenderness L3-S1 area  LUMBAR ROM:   AROM eval  Flexion 65% available  Extension 15% available  Right lateral flexion   Left lateral flexion   Right rotation   Left rotation    (Blank rows = not tested)  LOWER EXTREMITY ROM:     Active  Right eval Left eval  Hip flexion    Hip extension    Hip abduction    Hip adduction    Hip internal rotation    Hip external  rotation    Knee flexion    Knee extension    Ankle dorsiflexion    Ankle plantarflexion    Ankle inversion    Ankle eversion     (Blank rows = not tested)  LOWER EXTREMITY MMT:    MMT Right eval Left eval  Hip flexion 4 4+  Hip extension 3- 3-  Hip abduction    Hip adduction    Hip internal rotation    Hip external rotation    Knee flexion 4+ 4+  Knee extension 4+ 5  Ankle dorsiflexion 4+ 5  Ankle plantarflexion    Ankle inversion    Ankle eversion     (Blank rows = not tested)  FUNCTIONAL TESTS:  5 times sit to stand: 12.87 sec pushing up on knees some  GAIT: Distance walked: 50 ft in clinic Assistive device utilized: None Level of assistance: Modified independence Comments: forward flexed trunk  TODAY'S TREATMENT:                                                                                                                              DATE:  11/25/22 Review of HEP and goals Prone manual lumbar distraction (anterior and superior push) 10" hold 2 x 10" Discussion of lumbar traction trial   11/08/22 physical therapy evaluation and HEP instruction    PATIENT EDUCATION:  Education details: Patient educated on exam findings, POC, scope of PT, HEP, and what to expect next visit. Person educated: Patient Education method: Explanation, Demonstration, and Handouts Education comprehension: verbalized understanding, returned demonstration, verbal cues required, and tactile cues required  HOME EXERCISE PROGRAM: Access Code: [email or web address] URL: [email or web address] Date: 11/08/2022 Prepared by: [facility]  Exercises - Supine Lower Trunk Rotation  - 2 x daily - 7 x weekly - 1 sets - 10 reps - Supine Transversus Abdominis Bracing - Hands on Stomach  - 2 x daily - 7 x weekly - 1 sets - 10 reps - Supine Bridge  - 1 x daily - 7 x weekly - 3 sets - 10 reps - Supine March  - 1 x daily - 7 x weekly - 3 sets - 10 reps  ASSESSMENT:  CLINICAL  IMPRESSION: Today's session started with a review of HEP and goals; continued with lumbar manual distraction; patient reports no back  pain after manual.  Discussed with him trial of lumbar traction next visit.He is a bit lethargic during treatment today likely due to medication takes to sleep the night before. Patient will benefit from continued skilled therapy services  to address deficits and promote return to optimal function.       Eval:Patient is a 72 y.o. male who was seen today for physical therapy evaluation and treatment for M48.062 (ICD-10-CM) - Spinal stenosis, lumbar region with neurogenic claudication.  Patient demonstrates muscle weakness, reduced ROM, and fascial restrictions which are likely contributing to symptoms of pain and are negatively impacting patient ability to perform ADLs and functional mobility tasks. Patient will benefit from skilled physical therapy services to address these deficits to reduce pain and improve level of function with ADLs and functional mobility tasks.   OBJECTIVE IMPAIRMENTS: Abnormal gait, decreased activity tolerance, difficulty walking, decreased ROM, decreased strength, impaired perceived functional ability, and pain.   ACTIVITY LIMITATIONS: carrying, lifting, bending, sitting, standing, squatting, sleeping, stairs, and locomotion level  PARTICIPATION LIMITATIONS: meal prep, cleaning, laundry, community activity, and yard work  [facility] POTENTIAL: Good  CLINICAL DECISION MAKING: Stable/uncomplicated  EVALUATION COMPLEXITY: Low   GOALS: Goals reviewed with patient? No  SHORT TERM GOALS: Target date: 11/22/2022  patient will be independent with initial HEP  Baseline: Goal status: in progress   2.  Patient will self report 50% improvement to improve tolerance for functional activity  Baseline:  Goal status: in progress  LONG TERM GOALS: Target date: 12/09/2022  Patient will be independent in self management strategies to improve  quality of life and functional outcomes.   Baseline:  Goal status: IN progress  2.  Patient will self report 75% improvement to improve tolerance for functional activity  Baseline:  Goal status: IN progress  3.  Patient will improve 5 times sit to stand score from 12.87 sec to 11 sec to demonstrate improved functional mobility and increased lower extremity strength.   Baseline:  Goal status: IN progress  4.  Patient will increase  leg MMTs to 4+ to 5/5 without pain to promote return to ambulation community distances with minimal deviation.  Baseline:  Goal status: IN progress  5.  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 Baseline:  Goal status: IN progress   PLAN:  PT FREQUENCY: 2x/week  PT DURATION: 4 weeks  PLANNED INTERVENTIONS: Therapeutic exercises, Therapeutic activity, Neuromuscular re-education, Balance training, Gait training, Patient/Family education, Joint manipulation, Joint mobilization, Stair training, Orthotic/Fit training, DME instructions, Aquatic Therapy, Dry Needling, Electrical stimulation, Spinal manipulation, Spinal mobilization, Cryotherapy, Moist heat, Compression bandaging, scar mobilization, Splintting, Taping, Traction, Ultrasound, Ionotophoresis 4mg /ml Dexamethasone, and Manual therapy .  PLAN FOR NEXT SESSION: continue with lumbar manual;  core strengthening and lumbar mobility; try lumbar traction?   11:33 AM, 11/25/22 Deaja Rizo Small Adil Tugwell MPT [facility] [place] [phone]

## 2022-12-01 ENCOUNTER — Ambulatory Visit (HOSPITAL_COMMUNITY): Payer: Medicare HMO

## 2022-12-01 DIAGNOSIS — Q7649 Other congenital malformations of spine, not associated with scoliosis: Secondary | ICD-10-CM | POA: Diagnosis not present

## 2022-12-01 DIAGNOSIS — G8929 Other chronic pain: Secondary | ICD-10-CM

## 2022-12-01 DIAGNOSIS — M545 Low back pain, unspecified: Secondary | ICD-10-CM | POA: Diagnosis not present

## 2022-12-01 NOTE — Therapy (Signed)
OUTPATIENT PHYSICAL THERAPY THORACOLUMBAR TREATMENT   Patient Name: Jeremy Mclean MRN: [id number] DOB:1950-03-12, 72 y.o., male Today's Date: 12/01/2022  END OF SESSION:  PT End of Session - 12/01/22 1105     Visit Number 3    Number of Visits 8    Date for PT Re-Evaluation 12/09/22    Authorization Type [employer]    Authorization Time Period 8 visits approved 9/16 to 10/18    Authorization - Visit Number 3    Authorization - Number of Visits 8    PT Start Time 1105    PT Stop Time 1144    PT Time Calculation (min) 39 min    Activity Tolerance Patient tolerated treatment well    Behavior During Therapy WFL for tasks assessed/performed             Past Medical History:  Diagnosis Date   AAA (abdominal aortic aneurysm) (HCC)    Anxiety    Arthritis    COPD (chronic obstructive pulmonary disease) (HCC)    Degenerative disorder of bone    Depression    Diverticulitis    Dyspnea    due to COPD and when walking long distances per patient   GERD (gastroesophageal reflux disease)    History of kidney stones    Hypertension    Kidney stone    Spinal stenosis    Past Surgical History:  Procedure Laterality Date   ABDOMINAL AORTIC ANEURYSM REPAIR N/A 01/16/2019   Procedure: OPEN REPAIR OF ABDOMINAL AORTIC ANEURYSM;  Surgeon: Larina Earthly, MD;  Location: [facility] OR;  Service: Vascular;  Laterality: N/A;   BIOPSY  01/14/2017   Procedure: BIOPSY;  Surgeon: West Bali, MD;  Location: [facility]ITE;  Service: Endoscopy;;  gastric    CERVICAL SPINE SURGERY     C6-7 ACDF 09/22/09   COLONOSCOPY N/A 04/09/2014   Procedure: COLONOSCOPY;  Surgeon: Malissa Hippo, MD;  Location: [facility];  Service: Endoscopy;  Laterality: N/A;  730 - moved to 7:30 - Ann to notify pt   CYSTOSCOPY     with stent   ESOPHAGOGASTRODUODENOSCOPY N/A 01/14/2017   Procedure: ESOPHAGOGASTRODUODENOSCOPY (EGD);  Surgeon: West Bali, MD;  Location: [facility];  Service: Endoscopy;  Laterality:  N/A;   kidney stent     Patient Active Problem List   Diagnosis Date Noted   Lumbar foraminal stenosis 03/03/2022   Sacralization of lumbar vertebra 03/03/2022   Degenerative lumbar spinal stenosis 03/03/2022   Degeneration of lumbar intervertebral disc 03/03/2022   Lumbar spondylosis 03/03/2022   Arthropathy of lumbar facet joint 03/03/2022   Low back pain 08/21/2019   AAA (abdominal aortic aneurysm) (HCC) 01/16/2019   UGIB (upper gastrointestinal bleed)    Acute blood loss anemia 01/13/2017   Melena 01/13/2017   Precordial chest pain 01/13/2017   Neck pain 08/19/2015   Displacement of cervical intervertebral disc without myelopathy 08/19/2015   Cervical radiculopathy 08/19/2015   Iliac artery aneurysm (HCC) 07/11/2014   Tobacco abuse 07/11/2014   History of colonic polyps 03/18/2014   Abdominal aortic aneurysm (HCC) 03/18/2014   Essential hypertension 03/18/2014    PCP: Carylon Perches, MD  REFERRING PROVIDER: Clovis Riley, PA-C  REFERRING DIAG: [phone] (ICD-10-CM) - Spinal stenosis, lumbar region with neurogenic claudication  Rationale for Evaluation and Treatment: Rehabilitation  THERAPY DIAG:  Chronic low back pain, unspecified back pain laterality, unspecified whether sciatica present  Bertolotti's syndrome  ONSET DATE: chronic ongoing back pain  SUBJECTIVE:  SUBJECTIVE STATEMENT: Had 6 days of activity without hips locking up.  Able to lift 50# bags of deer corn last week  PERTINENT HISTORY:  Right knee fracture last year  PAIN:  Are you having pain? Yes: NPRS scale: 1-10/10 Pain location: low back Pain description: low back and right hip Aggravating factors: standing, walking Relieving factors: sitting, rest  PRECAUTIONS: Fall    WEIGHT BEARING RESTRICTIONS:  No  FALLS:  Has patient fallen in last 6 months? Yes. Number of falls 1 tripped outside  OCCUPATION: retired and active on his farm  PLOF: Independent  PATIENT GOALS: less pain  NEXT MD VISIT: PRN  OBJECTIVE:   DIAGNOSTIC FINDINGS:  CLINICAL DATA:  Foraminal stenosis of lumbar region   EXAM: MRI LUMBAR SPINE WITHOUT CONTRAST   TECHNIQUE: Multiplanar, multisequence MR imaging of the lumbar spine was performed. No intravenous contrast was administered.   COMPARISON:  03/12/2021   FINDINGS: Segmentation: 5 lumbar type vertebral bodies. Pseudoarticulation of elongated L5 transverse processes with the sacral ala.   Alignment: Dextrocurvature. Trace retrolisthesis of L2 on L3 and L3 on L4, unchanged. Trace anterolisthesis of L4 on L5, unchanged.   Vertebrae:  L1   Conus medullaris and cauda equina: Conus extends to the No acute fracture, evidence of discitis, or suspicious osseous lesion. level. Conus and cauda equina appear normal.   Paraspinal and other soft tissues: The infrarenal abdominal aorta measures up to 3 cm (series 5, image 9). Aortic atherosclerosis. Multiple renal cysts, for which no follow-up is currently indicated. No lymphadenopathy.   Disc levels:   T12-L1: No significant disc bulge. No spinal canal stenosis or neural foraminal narrowing.   L1-L2: Minimal disc bulge. Mild facet arthropathy. No spinal canal stenosis or neural foraminal narrowing.   L2-L3: Trace retrolisthesis and mild disc bulge with superimposed right foraminal extrusion with 6 mm of cranial migration, new from the prior exam. Moderate facet arthropathy. Narrowing of the lateral recesses. Ligamentum flavum hypertrophy. Mild-to-moderate spinal canal stenosis, unchanged. Moderate right neural foraminal narrowing, progressed from the prior exam.   L3-L4: Trace retrolisthesis with mild disc bulge and superimposed right subarticular disc extrusion with 7 mm of caudal  migration, unchanged. This likely contacts the descending right L4 nerve roots. Moderate facet arthropathy. Ligamentum flavum hypertrophy. Narrowing of the lateral recesses. Mild spinal canal stenosis. Moderate left neural foraminal narrowing, unchanged.   L4-L5: Trace anterolisthesis with disc unroofing and mild disc bulge. Severe facet arthropathy. No spinal canal stenosis or neural foraminal narrowing.   L5-S1: Mild disc bulge with superimposed right paracentral protrusion. Moderate facet arthropathy. No spinal canal stenosis. Mild right neural foraminal narrowing, unchanged.   IMPRESSION: 1. L2-L3 mild-to-moderate spinal canal stenosis, unchanged, and moderate right neural foraminal narrowing, which has progressed from the prior exam. Narrowing of the lateral recesses at this level could affect the descending L3 nerve roots. 2. L3-L4 mild spinal canal stenosis and moderate left neural foraminal narrowing, unchanged. Narrowing of the lateral recesses at this level could affect the descending L4 nerve roots. In addition, a disc extrusion at this level likely contacts the descending right L4 nerve roots. 3. L5-S1 mild right neural foraminal narrowing. 4. Multilevel facet arthropathy, which is severe at L4-L5, which can be a cause of back pain.     Electronically Signed   By: Wiliam Ke M.D.   On: 07/22/2022 02:13  PATIENT SURVEYS:  FOTO 45  COGNITION: Overall cognitive status: Within functional limits for tasks assessed     SENSATION: WFL  POSTURE:  rounded shoulders, forward head, and flexed trunk   PALPATION: Tenderness L3-S1 area  LUMBAR ROM:   AROM eval  Flexion 65% available  Extension 15% available  Right lateral flexion   Left lateral flexion   Right rotation   Left rotation    (Blank rows = not tested)  LOWER EXTREMITY ROM:     Active  Right eval Left eval  Hip flexion    Hip extension    Hip abduction    Hip adduction    Hip internal  rotation    Hip external rotation    Knee flexion    Knee extension    Ankle dorsiflexion    Ankle plantarflexion    Ankle inversion    Ankle eversion     (Blank rows = not tested)  LOWER EXTREMITY MMT:    MMT Right eval Left eval  Hip flexion 4 4+  Hip extension 3- 3-  Hip abduction    Hip adduction    Hip internal rotation    Hip external rotation    Knee flexion 4+ 4+  Knee extension 4+ 5  Ankle dorsiflexion 4+ 5  Ankle plantarflexion    Ankle inversion    Ankle eversion     (Blank rows = not tested)  FUNCTIONAL TESTS:  5 times sit to stand: 12.87 sec pushing up on knees some  GAIT: Distance walked: 50 ft in clinic Assistive device utilized: None Level of assistance: Modified independence Comments: forward flexed trunk  TODAY'S TREATMENT:                                                                                                                              DATE:  12/01/22 Trial of lumbar traction today; 15 minutes; 100 lb max intermittent; 4 steps Prone manual lumbar distraction (anterior and superior push) 10" hold 2 x 10"  11/25/22 Review of HEP and goals Prone manual lumbar distraction (anterior and superior push) 10" hold 2 x 10" Discussion of lumbar traction trial   11/08/22 physical therapy evaluation and HEP instruction    PATIENT EDUCATION:  Education details: Patient educated on exam findings, POC, scope of PT, HEP, and what to expect next visit. Person educated: Patient Education method: Explanation, Demonstration, and Handouts Education comprehension: verbalized understanding, returned demonstration, verbal cues required, and tactile cues required  HOME EXERCISE PROGRAM: Access Code: [email or web address] URL: [email or web address] Date: 11/08/2022 Prepared by: [facility]  Exercises - Supine Lower Trunk Rotation  - 2 x daily - 7 x weekly - 1 sets - 10 reps - Supine Transversus Abdominis Bracing - Hands on Stomach  - 2 x daily - 7 x  weekly - 1 sets - 10 reps - Supine Bridge  - 1 x daily - 7 x weekly - 3 sets - 10 reps - Supine March  - 1 x daily - 7 x weekly - 3 sets - 10 reps  ASSESSMENT:  CLINICAL IMPRESSION: Trial of lumbar traction  today as patient has been getting relief from manual mobilization.  100 lb pull intermittent with 4 steps x 15 min total. Continued with manual.  Patient needs min A for supine to sit from traction table. Patient reports mild back soreness after treatment only.   Patient will benefit from continued skilled therapy services  to address deficits and promote return to optimal function.       Eval:Patient is a 72 y.o. male who was seen today for physical therapy evaluation and treatment for M48.062 (ICD-10-CM) - Spinal stenosis, lumbar region with neurogenic claudication.  Patient demonstrates muscle weakness, reduced ROM, and fascial restrictions which are likely contributing to symptoms of pain and are negatively impacting patient ability to perform ADLs and functional mobility tasks. Patient will benefit from skilled physical therapy services to address these deficits to reduce pain and improve level of function with ADLs and functional mobility tasks.   OBJECTIVE IMPAIRMENTS: Abnormal gait, decreased activity tolerance, difficulty walking, decreased ROM, decreased strength, impaired perceived functional ability, and pain.   ACTIVITY LIMITATIONS: carrying, lifting, bending, sitting, standing, squatting, sleeping, stairs, and locomotion level  PARTICIPATION LIMITATIONS: meal prep, cleaning, laundry, community activity, and yard work  [facility] POTENTIAL: Good  CLINICAL DECISION MAKING: Stable/uncomplicated  EVALUATION COMPLEXITY: Low   GOALS: Goals reviewed with patient? No  SHORT TERM GOALS: Target date: 11/22/2022  patient will be independent with initial HEP  Baseline: Goal status: in progress   2.  Patient will self report 50% improvement to improve tolerance for functional  activity  Baseline:  Goal status: in progress  LONG TERM GOALS: Target date: 12/09/2022  Patient will be independent in self management strategies to improve quality of life and functional outcomes.   Baseline:  Goal status: IN progress  2.  Patient will self report 75% improvement to improve tolerance for functional activity  Baseline:  Goal status: IN progress  3.  Patient will improve 5 times sit to stand score from 12.87 sec to 11 sec to demonstrate improved functional mobility and increased lower extremity strength.   Baseline:  Goal status: IN progress  4.  Patient will increase  leg MMTs to 4+ to 5/5 without pain to promote return to ambulation community distances with minimal deviation.  Baseline:  Goal status: IN progress  5.  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 Baseline:  Goal status: IN progress   PLAN:  PT FREQUENCY: 2x/week  PT DURATION: 4 weeks  PLANNED INTERVENTIONS: Therapeutic exercises, Therapeutic activity, Neuromuscular re-education, Balance training, Gait training, Patient/Family education, Joint manipulation, Joint mobilization, Stair training, Orthotic/Fit training, DME instructions, Aquatic Therapy, Dry Needling, Electrical stimulation, Spinal manipulation, Spinal mobilization, Cryotherapy, Moist heat, Compression bandaging, scar mobilization, Splintting, Taping, Traction, Ultrasound, Ionotophoresis 4mg /ml Dexamethasone, and Manual therapy .  PLAN FOR NEXT SESSION: continue with lumbar manual;  core strengthening and lumbar mobility; assess reaction to  lumbar traction?   11:50 AM, 12/01/22 Clover Feehan Small Maraya Gwilliam MPT  physical therapy [place] [phone]

## 2022-12-05 ENCOUNTER — Ambulatory Visit (HOSPITAL_COMMUNITY): Payer: Medicare HMO

## 2022-12-05 DIAGNOSIS — Q7649 Other congenital malformations of spine, not associated with scoliosis: Secondary | ICD-10-CM | POA: Diagnosis not present

## 2022-12-05 DIAGNOSIS — G8929 Other chronic pain: Secondary | ICD-10-CM

## 2022-12-05 DIAGNOSIS — M545 Low back pain, unspecified: Secondary | ICD-10-CM | POA: Diagnosis not present

## 2022-12-05 NOTE — Therapy (Signed)
OUTPATIENT PHYSICAL THERAPY THORACOLUMBAR TREATMENT   Patient Name: Jeremy Mclean MRN: [id number] DOB:24-May-1950, 72 y.o., male Today's Date: 12/05/2022  END OF SESSION:  PT End of Session - 12/05/22 1022     Visit Number 4    Number of Visits 8    Date for PT Re-Evaluation 12/09/22    Authorization Type [employer]    Authorization Time Period 8 visits approved 9/16 to 10/18    Authorization - Visit Number 4    Authorization - Number of Visits 8    PT Start Time 1020    PT Stop Time 1100    PT Time Calculation (min) 40 min    Activity Tolerance Patient tolerated treatment well    Behavior During Therapy WFL for tasks assessed/performed             Past Medical History:  Diagnosis Date   AAA (abdominal aortic aneurysm) (HCC)    Anxiety    Arthritis    COPD (chronic obstructive pulmonary disease) (HCC)    Degenerative disorder of bone    Depression    Diverticulitis    Dyspnea    due to COPD and when walking long distances per patient   GERD (gastroesophageal reflux disease)    History of kidney stones    Hypertension    Kidney stone    Spinal stenosis    Past Surgical History:  Procedure Laterality Date   ABDOMINAL AORTIC ANEURYSM REPAIR N/A 01/16/2019   Procedure: OPEN REPAIR OF ABDOMINAL AORTIC ANEURYSM;  Surgeon: Larina Earthly, MD;  Location[facility];  Service: Vascular;  Laterality: N/A;   BIOPSY  01/14/2017   Procedure: BIOPSY;  Surgeon: West Bali, MD;  Location: [facility] SUITE;  Service: Endoscopy;;  gastric    CERVICAL SPINE SURGERY     C6-7 ACDF 09/22/09   COLONOSCOPY N/A 04/09/2014   Procedure: COLONOSCOPY;  Surgeon: Malissa Hippo, MD;  Location: [facility];  Service: Endoscopy;  Laterality: N/A;  730 - moved to 7:30 - Ann to notify pt   CYSTOSCOPY     with stent   ESOPHAGOGASTRODUODENOSCOPY N/A 01/14/2017   Procedure: ESOPHAGOGASTRODUODENOSCOPY (EGD);  Surgeon: West Bali, MD;  Location: [facility];  Service: Endoscopy;  Laterality:  N/A;   kidney stent     Patient Active Problem List   Diagnosis Date Noted   Lumbar foraminal stenosis 03/03/2022   Sacralization of lumbar vertebra 03/03/2022   Degenerative lumbar spinal stenosis 03/03/2022   Degeneration of lumbar intervertebral disc 03/03/2022   Lumbar spondylosis 03/03/2022   Arthropathy of lumbar facet joint 03/03/2022   Low back pain 08/21/2019   AAA (abdominal aortic aneurysm) (HCC) 01/16/2019   UGIB (upper gastrointestinal bleed)    Acute blood loss anemia 01/13/2017   Melena 01/13/2017   Precordial chest pain 01/13/2017   Neck pain 08/19/2015   Displacement of cervical intervertebral disc without myelopathy 08/19/2015   Cervical radiculopathy 08/19/2015   Iliac artery aneurysm (HCC) 07/11/2014   Tobacco abuse 07/11/2014   History of colonic polyps 03/18/2014   Abdominal aortic aneurysm (HCC) 03/18/2014   Essential hypertension 03/18/2014    PCP: Carylon Perches, MD  REFERRING PROVIDER: Clovis Riley, PA-C  REFERRING DIAG: [phone] (ICD-10-CM) - Spinal stenosis, lumbar region with neurogenic claudication  Rationale for Evaluation and Treatment: Rehabilitation  THERAPY DIAG:  Chronic low back pain, unspecified back pain laterality, unspecified whether sciatica present  Bertolotti's syndrome  ONSET DATE: chronic ongoing back pain  SUBJECTIVE:  SUBJECTIVE STATEMENT: States he felt worse after lumbar traction last visit.  His back locked up over the weekend.  Also feels a little out of it this morning; states he had a hard time sleeping last night; took a Diazapam and so feels groggy this morning. States he had back pain yesterday and this morning but not really any pain right now.    PERTINENT HISTORY:  Right knee fracture last year  PAIN:  Are you having  pain? Yes: NPRS scale: 1-10/10 Pain location: low back Pain description: low back and right hip Aggravating factors: standing, walking Relieving factors: sitting, rest  PRECAUTIONS: Fall    WEIGHT BEARING RESTRICTIONS: No  FALLS:  Has patient fallen in last 6 months? Yes. Number of falls 1 tripped outside  OCCUPATION: retired and active on his farm  PLOF: Independent  PATIENT GOALS: less pain  NEXT MD VISIT: PRN  OBJECTIVE:   DIAGNOSTIC FINDINGS:  CLINICAL DATA:  Foraminal stenosis of lumbar region   EXAM: MRI LUMBAR SPINE WITHOUT CONTRAST   TECHNIQUE: Multiplanar, multisequence MR imaging of the lumbar spine was performed. No intravenous contrast was administered.   COMPARISON:  03/12/2021   FINDINGS: Segmentation: 5 lumbar type vertebral bodies. Pseudoarticulation of elongated L5 transverse processes with the sacral ala.   Alignment: Dextrocurvature. Trace retrolisthesis of L2 on L3 and L3 on L4, unchanged. Trace anterolisthesis of L4 on L5, unchanged.   Vertebrae:  L1   Conus medullaris and cauda equina: Conus extends to the No acute fracture, evidence of discitis, or suspicious osseous lesion. level. Conus and cauda equina appear normal.   Paraspinal and other soft tissues: The infrarenal abdominal aorta measures up to 3 cm (series 5, image 9). Aortic atherosclerosis. Multiple renal cysts, for which no follow-up is currently indicated. No lymphadenopathy.   Disc levels:   T12-L1: No significant disc bulge. No spinal canal stenosis or neural foraminal narrowing.   L1-L2: Minimal disc bulge. Mild facet arthropathy. No spinal canal stenosis or neural foraminal narrowing.   L2-L3: Trace retrolisthesis and mild disc bulge with superimposed right foraminal extrusion with 6 mm of cranial migration, new from the prior exam. Moderate facet arthropathy. Narrowing of the lateral recesses. Ligamentum flavum hypertrophy. Mild-to-moderate spinal canal  stenosis, unchanged. Moderate right neural foraminal narrowing, progressed from the prior exam.   L3-L4: Trace retrolisthesis with mild disc bulge and superimposed right subarticular disc extrusion with 7 mm of caudal migration, unchanged. This likely contacts the descending right L4 nerve roots. Moderate facet arthropathy. Ligamentum flavum hypertrophy. Narrowing of the lateral recesses. Mild spinal canal stenosis. Moderate left neural foraminal narrowing, unchanged.   L4-L5: Trace anterolisthesis with disc unroofing and mild disc bulge. Severe facet arthropathy. No spinal canal stenosis or neural foraminal narrowing.   L5-S1: Mild disc bulge with superimposed right paracentral protrusion. Moderate facet arthropathy. No spinal canal stenosis. Mild right neural foraminal narrowing, unchanged.   IMPRESSION: 1. L2-L3 mild-to-moderate spinal canal stenosis, unchanged, and moderate right neural foraminal narrowing, which has progressed from the prior exam. Narrowing of the lateral recesses at this level could affect the descending L3 nerve roots. 2. L3-L4 mild spinal canal stenosis and moderate left neural foraminal narrowing, unchanged. Narrowing of the lateral recesses at this level could affect the descending L4 nerve roots. In addition, a disc extrusion at this level likely contacts the descending right L4 nerve roots. 3. L5-S1 mild right neural foraminal narrowing. 4. Multilevel facet arthropathy, which is severe at L4-L5, which can be a cause of back pain.  Electronically Signed   By: Wiliam Ke M.D.   On: 07/22/2022 02:13  PATIENT SURVEYS:  FOTO 45  COGNITION: Overall cognitive status: Within functional limits for tasks assessed     SENSATION: WFL  POSTURE: rounded shoulders, forward head, and flexed trunk   PALPATION: Tenderness L3-S1 area  LUMBAR ROM:   AROM eval  Flexion 65% available  Extension 15% available  Right lateral flexion   Left lateral  flexion   Right rotation   Left rotation    (Blank rows = not tested)  LOWER EXTREMITY ROM:     Active  Right eval Left eval  Hip flexion    Hip extension    Hip abduction    Hip adduction    Hip internal rotation    Hip external rotation    Knee flexion    Knee extension    Ankle dorsiflexion    Ankle plantarflexion    Ankle inversion    Ankle eversion     (Blank rows = not tested)  LOWER EXTREMITY MMT:    MMT Right eval Left eval  Hip flexion 4 4+  Hip extension 3- 3-  Hip abduction    Hip adduction    Hip internal rotation    Hip external rotation    Knee flexion 4+ 4+  Knee extension 4+ 5  Ankle dorsiflexion 4+ 5  Ankle plantarflexion    Ankle inversion    Ankle eversion     (Blank rows = not tested)  FUNCTIONAL TESTS:  5 times sit to stand: 12.87 sec pushing up on knees some  GAIT: Distance walked: 50 ft in clinic Assistive device utilized: None Level of assistance: Modified independence Comments: forward flexed trunk  TODAY'S TREATMENT:                                                                                                                              DATE:  12/05/22 Prone lying over 1 pillow with moist heat x 10' Prone manual lumbar distraction (anterior and superior push) 10" hold 2 x 10"  12/01/22 Trial of lumbar traction today; 15 minutes; 100 lb max intermittent; 4 steps Prone manual lumbar distraction (anterior and superior push) 10" hold 2 x 10"  11/25/22 Review of HEP and goals Prone manual lumbar distraction (anterior and superior push) 10" hold 2 x 10" Discussion of lumbar traction trial   11/08/22 physical therapy evaluation and HEP instruction    PATIENT EDUCATION:  Education details: Patient educated on exam findings, POC, scope of PT, HEP, and what to expect next visit. Person educated: Patient Education method: Explanation, Demonstration, and Handouts Education comprehension: verbalized understanding, returned  demonstration, verbal cues required, and tactile cues required  HOME EXERCISE PROGRAM: Access Code: [email or web address] URL: [email or web address] Date: 11/08/2022 Prepared by: [facility]  Exercises - Supine Lower Trunk Rotation  - 2 x daily - 7 x weekly - 1 sets - 10 reps - Supine Transversus Abdominis Bracing -  Hands on Stomach  - 2 x daily - 7 x weekly - 1 sets - 10 reps - Supine Bridge  - 1 x daily - 7 x weekly - 3 sets - 10 reps - Supine March  - 1 x daily - 7 x weekly - 3 sets - 10 reps  ASSESSMENT:  CLINICAL IMPRESSION: Did not do lumbar traction today as patient did not have any relief with mechanical traction; he has been getting relief from manual mobilization.  Continued with prone positioning and manual lumbar anterior/superior mobilization. Discussed sleep positioning with patient and encouraged him to contact his PCP regarding his inability to sleep.    Patient will benefit from continued skilled therapy services  to address deficits and promote return to optimal function.       Eval:Patient is a 72 y.o. male who was seen today for physical therapy evaluation and treatment for M48.062 (ICD-10-CM) - Spinal stenosis, lumbar region with neurogenic claudication.  Patient demonstrates muscle weakness, reduced ROM, and fascial restrictions which are likely contributing to symptoms of pain and are negatively impacting patient ability to perform ADLs and functional mobility tasks. Patient will benefit from skilled physical therapy services to address these deficits to reduce pain and improve level of function with ADLs and functional mobility tasks.   OBJECTIVE IMPAIRMENTS: Abnormal gait, decreased activity tolerance, difficulty walking, decreased ROM, decreased strength, impaired perceived functional ability, and pain.   ACTIVITY LIMITATIONS: carrying, lifting, bending, sitting, standing, squatting, sleeping, stairs, and locomotion level  PARTICIPATION LIMITATIONS: meal prep,  cleaning, laundry, community activity, and yard work  [facility] POTENTIAL: Good  CLINICAL DECISION MAKING: Stable/uncomplicated  EVALUATION COMPLEXITY: Low   GOALS: Goals reviewed with patient? No  SHORT TERM GOALS: Target date: 11/22/2022  patient will be independent with initial HEP  Baseline: Goal status: in progress   2.  Patient will self report 50% improvement to improve tolerance for functional activity  Baseline:  Goal status: in progress  LONG TERM GOALS: Target date: 12/09/2022  Patient will be independent in self management strategies to improve quality of life and functional outcomes.   Baseline:  Goal status: IN progress  2.  Patient will self report 75% improvement to improve tolerance for functional activity  Baseline:  Goal status: IN progress  3.  Patient will improve 5 times sit to stand score from 12.87 sec to 11 sec to demonstrate improved functional mobility and increased lower extremity strength.   Baseline:  Goal status: IN progress  4.  Patient will increase  leg MMTs to 4+ to 5/5 without pain to promote return to ambulation community distances with minimal deviation.  Baseline:  Goal status: IN progress  5.  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 Baseline:  Goal status: IN progress   PLAN:  PT FREQUENCY: 2x/week  PT DURATION: 4 weeks  PLANNED INTERVENTIONS: Therapeutic exercises, Therapeutic activity, Neuromuscular re-education, Balance training, Gait training, Patient/Family education, Joint manipulation, Joint mobilization, Stair training, Orthotic/Fit training, DME instructions, Aquatic Therapy, Dry Needling, Electrical stimulation, Spinal manipulation, Spinal mobilization, Cryotherapy, Moist heat, Compression bandaging, scar mobilization, Splintting, Taping, Traction, Ultrasound, Ionotophoresis 4mg /ml Dexamethasone, and Manual therapy .  PLAN FOR NEXT SESSION: continue with  lumbar manual;  core strengthening and lumbar mobility 10:51 AM, 12/05/22 Petina Muraski Small [place] Weight MPT [place] [facility] [place] [phone] [phone]

## 2022-12-07 ENCOUNTER — Ambulatory Visit (HOSPITAL_COMMUNITY): Payer: Medicare HMO

## 2022-12-07 DIAGNOSIS — Q7649 Other congenital malformations of spine, not associated with scoliosis: Secondary | ICD-10-CM | POA: Diagnosis not present

## 2022-12-07 DIAGNOSIS — M545 Low back pain, unspecified: Secondary | ICD-10-CM | POA: Diagnosis not present

## 2022-12-07 DIAGNOSIS — G8929 Other chronic pain: Secondary | ICD-10-CM

## 2022-12-07 NOTE — Therapy (Signed)
OUTPATIENT PHYSICAL THERAPY THORACOLUMBAR TREATMENT   Patient Name: Jeremy Mclean MRN: [id number] DOB:1950/09/18, 72 y.o., male Today's Date: 12/07/2022  END OF SESSION:  PT End of Session - 12/07/22 1017     Visit Number 5    Number of Visits 8    Date for PT Re-Evaluation 12/09/22    Authorization Type [employer]    Authorization Time Period 8 visits approved 9/16 to 10/18    Authorization - Visit Number 5    Authorization - Number of Visits 8    PT Start Time 1017    Activity Tolerance Patient tolerated treatment well    Behavior During Therapy [facility] for tasks assessed/performed             Past Medical History:  Diagnosis Date   AAA (abdominal aortic aneurysm) (HCC)    Anxiety    Arthritis    COPD (chronic obstructive pulmonary disease) (HCC)    Degenerative disorder of bone    Depression    Diverticulitis    Dyspnea    due to COPD and when walking long distances per patient   GERD (gastroesophageal reflux disease)    History of kidney stones    Hypertension    Kidney stone    Spinal stenosis    Past Surgical History:  Procedure Laterality Date   ABDOMINAL AORTIC ANEURYSM REPAIR N/A 01/16/2019   Procedure: OPEN REPAIR OF ABDOMINAL AORTIC ANEURYSM;  Surgeon: Larina Earthly, MD;  Location: [facility] OR;  Service: Vascular;  Laterality: N/A;   BIOPSY  01/14/2017   Procedure: BIOPSY;  Surgeon: West Bali, MD;  Location: [facility] END[facility] SUITE;  Service: Endoscopy;;  gastric    CERVICAL SPINE SURGERY     C6-7 ACDF 09/22/09   COLONOSCOPY N/A 04/09/2014   Procedure: COLONOSCOPY;  Surgeon: Malissa Hippo, MD;  Location: [facility] SUITE;  Service: Endoscopy;  Laterality: N/A;  730 - moved to 7:30 - Ann to notify pt   CYSTOSCOPY     with stent   ESOPHAGOGASTRODUODENOSCOPY N/A 01/14/2017   Procedure: ESOPHAGOGASTRODUODENOSCOPY (EGD);  Surgeon: West Bali, MD;  Location: [facility] SUITE;  Service: Endoscopy;  Laterality: N/A;   kidney stent     Patient Active Problem List    Diagnosis Date Noted   Lumbar foraminal stenosis 03/03/2022   Sacralization of lumbar vertebra 03/03/2022   Degenerative lumbar spinal stenosis 03/03/2022   Degeneration of lumbar intervertebral disc 03/03/2022   Lumbar spondylosis 03/03/2022   Arthropathy of lumbar facet joint 03/03/2022   Low back pain 08/21/2019   AAA (abdominal aortic aneurysm) (HCC) 01/16/2019   UGIB (upper gastrointestinal bleed)    Acute blood loss anemia 01/13/2017   Melena 01/13/2017   Precordial chest pain 01/13/2017   Neck pain 08/19/2015   Displacement of cervical intervertebral disc without myelopathy 08/19/2015   Cervical radiculopathy 08/19/2015   Iliac artery aneurysm (HCC) 07/11/2014   Tobacco abuse 07/11/2014   History of colonic polyps 03/18/2014   Abdominal aortic aneurysm (HCC) 03/18/2014   Essential hypertension 03/18/2014    PCP: Carylon Perches, MD  REFERRING PROVIDER: Clovis Riley, PA-C  REFERRING DIAG: [phone] (ICD-10-CM) - Spinal stenosis, lumbar region with neurogenic claudication  Rationale for Evaluation and Treatment: Rehabilitation  THERAPY DIAG:  Chronic low back pain, unspecified back pain laterality, unspecified whether sciatica present  Bertolotti's syndrome  ONSET DATE: chronic ongoing back pain  SUBJECTIVE:  SUBJECTIVE STATEMENT: He states he is doing his exercises at home; went to [facility] yesterday and his hips locked up some on him; back this morning about 3-4/10   PERTINENT HISTORY:  Right knee fracture last year  PAIN:  Are you having pain? Yes: NPRS scale: 1-10/10 Pain location: low back Pain description: low back and right hip Aggravating factors: standing, walking Relieving factors: sitting, rest  PRECAUTIONS: Fall    WEIGHT BEARING RESTRICTIONS: No  FALLS:  Has  patient fallen in last 6 months? Yes. Number of falls 1 tripped outside  OCCUPATION: retired and active on his farm  PLOF: Independent  PATIENT GOALS: less pain  NEXT MD VISIT: PRN  OBJECTIVE:   DIAGNOSTIC FINDINGS:  CLINICAL DATA:  Foraminal stenosis of lumbar region   EXAM: MRI LUMBAR SPINE WITHOUT CONTRAST   TECHNIQUE: Multiplanar, multisequence MR imaging of the lumbar spine was performed. No intravenous contrast was administered.   COMPARISON:  03/12/2021   FINDINGS: Segmentation: 5 lumbar type vertebral bodies. Pseudoarticulation of elongated L5 transverse processes with the sacral ala.   Alignment: Dextrocurvature. Trace retrolisthesis of L2 on L3 and L3 on L4, unchanged. Trace anterolisthesis of L4 on L5, unchanged.   Vertebrae:  L1   Conus medullaris and cauda equina: Conus extends to the No acute fracture, evidence of discitis, or suspicious osseous lesion. level. Conus and cauda equina appear normal.   Paraspinal and other soft tissues: The infrarenal abdominal aorta measures up to 3 cm (series 5, image 9). Aortic atherosclerosis. Multiple renal cysts, for which no follow-up is currently indicated. No lymphadenopathy.   Disc levels:   T12-L1: No significant disc bulge. No spinal canal stenosis or neural foraminal narrowing.   L1-L2: Minimal disc bulge. Mild facet arthropathy. No spinal canal stenosis or neural foraminal narrowing.   L2-L3: Trace retrolisthesis and mild disc bulge with superimposed right foraminal extrusion with 6 mm of cranial migration, new from the prior exam. Moderate facet arthropathy. Narrowing of the lateral recesses. Ligamentum flavum hypertrophy. Mild-to-moderate spinal canal stenosis, unchanged. Moderate right neural foraminal narrowing, progressed from the prior exam.   L3-L4: Trace retrolisthesis with mild disc bulge and superimposed right subarticular disc extrusion with 7 mm of caudal migration, unchanged. This likely  contacts the descending right L4 nerve roots. Moderate facet arthropathy. Ligamentum flavum hypertrophy. Narrowing of the lateral recesses. Mild spinal canal stenosis. Moderate left neural foraminal narrowing, unchanged.   L4-L5: Trace anterolisthesis with disc unroofing and mild disc bulge. Severe facet arthropathy. No spinal canal stenosis or neural foraminal narrowing.   L5-S1: Mild disc bulge with superimposed right paracentral protrusion. Moderate facet arthropathy. No spinal canal stenosis. Mild right neural foraminal narrowing, unchanged.   IMPRESSION: 1. L2-L3 mild-to-moderate spinal canal stenosis, unchanged, and moderate right neural foraminal narrowing, which has progressed from the prior exam. Narrowing of the lateral recesses at this level could affect the descending L3 nerve roots. 2. L3-L4 mild spinal canal stenosis and moderate left neural foraminal narrowing, unchanged. Narrowing of the lateral recesses at this level could affect the descending L4 nerve roots. In addition, a disc extrusion at this level likely contacts the descending right L4 nerve roots. 3. L5-S1 mild right neural foraminal narrowing. 4. Multilevel facet arthropathy, which is severe at L4-L5, which can be a cause of back pain.     Electronically Signed   By: Wiliam Ke M.D.   On: 07/22/2022 02:13  PATIENT SURVEYS:  FOTO 45  COGNITION: Overall cognitive status: Within functional limits for tasks assessed  SENSATION: WFL  POSTURE: rounded shoulders, forward head, and flexed trunk   PALPATION: Tenderness L3-S1 area  LUMBAR ROM:   AROM eval  Flexion 65% available  Extension 15% available  Right lateral flexion   Left lateral flexion   Right rotation   Left rotation    (Blank rows = not tested)  LOWER EXTREMITY ROM:     Active  Right eval Left eval  Hip flexion    Hip extension    Hip abduction    Hip adduction    Hip internal rotation    Hip external rotation     Knee flexion    Knee extension    Ankle dorsiflexion    Ankle plantarflexion    Ankle inversion    Ankle eversion     (Blank rows = not tested)  LOWER EXTREMITY MMT:    MMT Right eval Left eval  Hip flexion 4 4+  Hip extension 3- 3-  Hip abduction    Hip adduction    Hip internal rotation    Hip external rotation    Knee flexion 4+ 4+  Knee extension 4+ 5  Ankle dorsiflexion 4+ 5  Ankle plantarflexion    Ankle inversion    Ankle eversion     (Blank rows = not tested)  FUNCTIONAL TESTS:  5 times sit to stand: 12.87 sec pushing up on knees some  GAIT: Distance walked: 50 ft in clinic Assistive device utilized: None Level of assistance: Modified independence Comments: forward flexed trunk  TODAY'S TREATMENT:                                                                                                                              DATE:  12/07/22 Prone lying over 1 pillow with moist heat x 10' Prone manual lumbar distraction (anterior and superior push) 10" hold 2 x 10" Review of HEP  12/05/22 Prone lying over 1 pillow with moist heat x 10' Prone manual lumbar distraction (anterior and superior push) 10" hold 2 x 10"  12/01/22 Trial of lumbar traction today; 15 minutes; 100 lb max intermittent; 4 steps Prone manual lumbar distraction (anterior and superior push) 10" hold 2 x 10"  11/25/22 Review of HEP and goals Prone manual lumbar distraction (anterior and superior push) 10" hold 2 x 10" Discussion of lumbar traction trial   11/08/22 physical therapy evaluation and HEP instruction    PATIENT EDUCATION:  Education details: Patient educated on exam findings, POC, scope of PT, HEP, and what to expect next visit. Person educated: Patient Education method: Explanation, Demonstration, and Handouts Education comprehension: verbalized understanding, returned demonstration, verbal cues required, and tactile cues required  HOME EXERCISE PROGRAM: Access Code:  [email or web address] URL: [email or web address] Date: 11/08/2022 Prepared by: [facility] - [facility]  Exercises - Supine Lower Trunk Rotation  - 2 x daily - 7 x weekly - 1 sets - 10 reps - Supine Transversus Abdominis Bracing - Hands on Stomach  - 2  x daily - 7 x weekly - 1 sets - 10 reps - Supine Bridge  - 1 x daily - 7 x weekly - 3 sets - 10 reps - Supine March  - 1 x daily - 7 x weekly - 3 sets - 10 reps  ASSESSMENT:  CLINICAL IMPRESSION: Patient reports continued relief only with manual technique.   Started with prone lying and moist heat to relax soft tissue in preparation for manual techniques.   Patient will benefit from continued skilled therapy services  to address deficits and promote return to optimal function.       Eval:Patient is a 72 y.o. male who was seen today for physical therapy evaluation and treatment for M48.062 (ICD-10-CM) - Spinal stenosis, lumbar region with neurogenic claudication.  Patient demonstrates muscle weakness, reduced ROM, and fascial restrictions which are likely contributing to symptoms of pain and are negatively impacting patient ability to perform ADLs and functional mobility tasks. Patient will benefit from skilled physical therapy services to address these deficits to reduce pain and improve level of function with ADLs and functional mobility tasks.   OBJECTIVE IMPAIRMENTS: Abnormal gait, decreased activity tolerance, difficulty walking, decreased ROM, decreased strength, impaired perceived functional ability, and pain.   ACTIVITY LIMITATIONS: carrying, lifting, bending, sitting, standing, squatting, sleeping, stairs, and locomotion level  PARTICIPATION LIMITATIONS: meal prep, cleaning, laundry, community activity, and yard work  [facility] POTENTIAL: Good  CLINICAL DECISION MAKING: Stable/uncomplicated  EVALUATION COMPLEXITY: Low   GOALS: Goals reviewed with patient? No  SHORT TERM GOALS: Target date: 11/22/2022  patient will be independent with  initial HEP  Baseline: Goal status: in progress   2.  Patient will self report 50% improvement to improve tolerance for functional activity  Baseline:  Goal status: in progress  LONG TERM GOALS: Target date: 12/09/2022  Patient will be independent in self management strategies to improve quality of life and functional outcomes.   Baseline:  Goal status: IN progress  2.  Patient will self report 75% improvement to improve tolerance for functional activity  Baseline:  Goal status: IN progress  3.  Patient will improve 5 times sit to stand score from 12.87 sec to 11 sec to demonstrate improved functional mobility and increased lower extremity strength.   Baseline:  Goal status: IN progress  4.  Patient will increase  leg MMTs to 4+ to 5/5 without pain to promote return to ambulation community distances with minimal deviation.  Baseline:  Goal status: IN progress  5.  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 Baseline:  Goal status: IN progress   PLAN:  PT FREQUENCY: 2x/week  PT DURATION: 4 weeks  PLANNED INTERVENTIONS: Therapeutic exercises, Therapeutic activity, Neuromuscular re-education, Balance training, Gait training, Patient/Family education, Joint manipulation, Joint mobilization, Stair training, Orthotic/Fit training, DME instructions, Aquatic Therapy, Dry Needling, Electrical stimulation, Spinal manipulation, Spinal mobilization, Cryotherapy, Moist heat, Compression bandaging, scar mobilization, Splintting, Taping, Traction, Ultrasound, Ionotophoresis 4mg /ml Dexamethasone, and Manual therapy .  PLAN FOR NEXT SESSION: continue with lumbar manual;  core strengthening and lumbar mobility; reassess next visit 10:51 AM, 12/07/22 [place] Klee Small Keegan Ducey MPT [place] [facility] [place] [phone] [phone]

## 2022-12-13 ENCOUNTER — Ambulatory Visit (HOSPITAL_COMMUNITY): Payer: Medicare HMO

## 2022-12-13 DIAGNOSIS — Q7649 Other congenital malformations of spine, not associated with scoliosis: Secondary | ICD-10-CM | POA: Diagnosis not present

## 2022-12-13 DIAGNOSIS — M545 Low back pain, unspecified: Secondary | ICD-10-CM

## 2022-12-13 DIAGNOSIS — G8929 Other chronic pain: Secondary | ICD-10-CM | POA: Diagnosis not present

## 2022-12-13 NOTE — Therapy (Signed)
OUTPATIENT PHYSICAL THERAPY THORACOLUMBAR TREATMENT/PROGRESS NOTE Progress Note Reporting Period 11/08/2022 to 12/13/2022  See note below for Objective Data and Assessment of Progress/Goals.       Patient Name: Jeremy Mclean MRN: [id number] DOB:08/01/1950, 72 y.o., male Today's Date: 12/13/2022  END OF SESSION:  PT End of Session - 12/13/22 1017     Visit Number 6    Number of Visits 8    Date for PT Re-Evaluation 12/09/22    Authorization Type [employer]    Authorization Time Period 8 visits approved 9/16 to 10/18    Authorization - Visit Number 6    Authorization - Number of Visits 8    PT Start Time 1017    PT Stop Time 1100    PT Time Calculation (min) 43 min    Activity Tolerance Patient tolerated treatment well    Behavior During Therapy WFL for tasks assessed/performed             Past Medical History:  Diagnosis Date   AAA (abdominal aortic aneurysm) (HCC)    Anxiety    Arthritis    COPD (chronic obstructive pulmonary disease) (HCC)    Degenerative disorder of bone    Depression    Diverticulitis    Dyspnea    due to COPD and when walking long distances per patient   GERD (gastroesophageal reflux disease)    History of kidney stones    Hypertension    Kidney stone    Spinal stenosis    Past Surgical History:  Procedure Laterality Date   ABDOMINAL AORTIC ANEURYSM REPAIR N/A 01/16/2019   Procedure: OPEN REPAIR OF ABDOMINAL AORTIC ANEURYSM;  Surgeon: Larina Earthly, MD;  Location: [facility] OR;  Service: Vascular;  Laterality: N/A;   BIOPSY  01/14/2017   Procedure: BIOPSY;  Surgeon: West Bali, MD;  Location: [facility];  Service: Endoscopy;;  gastric    CERVICAL SPINE SURGERY     C6-7 ACDF 09/22/09   COLONOSCOPY N/A 04/09/2014   Procedure: COLONOSCOPY;  Surgeon: Malissa Hippo, MD;  Location: [facility];  Service: Endoscopy;  Laterality: N/A;  730 - moved to 7:30 - Ann to notify pt   CYSTOSCOPY     with stent   ESOPHAGOGASTRODUODENOSCOPY N/A  01/14/2017   Procedure: ESOPHAGOGASTRODUODENOSCOPY (EGD);  Surgeon: West Bali, MD;  Location: [facility];  Service: Endoscopy;  Laterality: N/A;   kidney stent     Patient Active Problem List   Diagnosis Date Noted   Lumbar foraminal stenosis 03/03/2022   Sacralization of lumbar vertebra 03/03/2022   Degenerative lumbar spinal stenosis 03/03/2022   Degeneration of lumbar intervertebral disc 03/03/2022   Lumbar spondylosis 03/03/2022   Arthropathy of lumbar facet joint 03/03/2022   Low back pain 08/21/2019   AAA (abdominal aortic aneurysm) (HCC) 01/16/2019   UGIB (upper gastrointestinal bleed)    Acute blood loss anemia 01/13/2017   Melena 01/13/2017   Precordial chest pain 01/13/2017   Neck pain 08/19/2015   Displacement of cervical intervertebral disc without myelopathy 08/19/2015   Cervical radiculopathy 08/19/2015   Iliac artery aneurysm (HCC) 07/11/2014   Tobacco abuse 07/11/2014   History of colonic polyps 03/18/2014   Abdominal aortic aneurysm (HCC) 03/18/2014   Essential hypertension 03/18/2014    PCP: Carylon Perches, MD  REFERRING PROVIDER: Clovis Riley, PA-C  REFERRING DIAG: [phone] (ICD-10-CM) - Spinal stenosis, lumbar region with neurogenic claudication  Rationale for Evaluation and Treatment: Rehabilitation  THERAPY DIAG:  Chronic low back pain,  unspecified back pain laterality, unspecified whether sciatica present - Plan: PT plan of care cert/re-cert  Bertolotti's syndrome - Plan: PT plan of care cert/re-cert  ONSET DATE: chronic ongoing back pain  SUBJECTIVE:                                                                                                                                                                                           SUBJECTIVE STATEMENT: Back hurts all the time; right hip seems to hurt more than left; locking up less; "60% better"; rode my tractor yesterday and messed it up; also had a fall last week; put his foot up  on a chair to tie his shoe and lost his balance.    PERTINENT HISTORY:  Right knee fracture last year  PAIN:  Are you having pain? Yes: NPRS scale: 1-10/10 Pain location: low back Pain description: low back and right hip Aggravating factors: standing, walking Relieving factors: sitting, rest  PRECAUTIONS: Fall    WEIGHT BEARING RESTRICTIONS: No  FALLS:  Has patient fallen in last 6 months? Yes. Number of falls 1 tripped outside  OCCUPATION: retired and active on his farm  PLOF: Independent  PATIENT GOALS: less pain  NEXT MD VISIT: PRN  OBJECTIVE:   DIAGNOSTIC FINDINGS:  CLINICAL DATA:  Foraminal stenosis of lumbar region   EXAM: MRI LUMBAR SPINE WITHOUT CONTRAST   TECHNIQUE: Multiplanar, multisequence MR imaging of the lumbar spine was performed. No intravenous contrast was administered.   COMPARISON:  03/12/2021   FINDINGS: Segmentation: 5 lumbar type vertebral bodies. Pseudoarticulation of elongated L5 transverse processes with the sacral ala.   Alignment: Dextrocurvature. Trace retrolisthesis of L2 on L3 and L3 on L4, unchanged. Trace anterolisthesis of L4 on L5, unchanged.   Vertebrae:  L1   Conus medullaris and cauda equina: Conus extends to the No acute fracture, evidence of discitis, or suspicious osseous lesion. level. Conus and cauda equina appear normal.   Paraspinal and other soft tissues: The infrarenal abdominal aorta measures up to 3 cm (series 5, image 9). Aortic atherosclerosis. Multiple renal cysts, for which no follow-up is currently indicated. No lymphadenopathy.   Disc levels:   T12-L1: No significant disc bulge. No spinal canal stenosis or neural foraminal narrowing.   L1-L2: Minimal disc bulge. Mild facet arthropathy. No spinal canal stenosis or neural foraminal narrowing.   L2-L3: Trace retrolisthesis and mild disc bulge with superimposed right foraminal extrusion with 6 mm of cranial migration, new from the prior exam.  Moderate facet arthropathy. Narrowing of the lateral recesses. Ligamentum flavum hypertrophy. Mild-to-moderate spinal canal stenosis, unchanged. Moderate right neural foraminal narrowing, progressed from the prior  exam.   L3-L4: Trace retrolisthesis with mild disc bulge and superimposed right subarticular disc extrusion with 7 mm of caudal migration, unchanged. This likely contacts the descending right L4 nerve roots. Moderate facet arthropathy. Ligamentum flavum hypertrophy. Narrowing of the lateral recesses. Mild spinal canal stenosis. Moderate left neural foraminal narrowing, unchanged.   L4-L5: Trace anterolisthesis with disc unroofing and mild disc bulge. Severe facet arthropathy. No spinal canal stenosis or neural foraminal narrowing.   L5-S1: Mild disc bulge with superimposed right paracentral protrusion. Moderate facet arthropathy. No spinal canal stenosis. Mild right neural foraminal narrowing, unchanged.   IMPRESSION: 1. L2-L3 mild-to-moderate spinal canal stenosis, unchanged, and moderate right neural foraminal narrowing, which has progressed from the prior exam. Narrowing of the lateral recesses at this level could affect the descending L3 nerve roots. 2. L3-L4 mild spinal canal stenosis and moderate left neural foraminal narrowing, unchanged. Narrowing of the lateral recesses at this level could affect the descending L4 nerve roots. In addition, a disc extrusion at this level likely contacts the descending right L4 nerve roots. 3. L5-S1 mild right neural foraminal narrowing. 4. Multilevel facet arthropathy, which is severe at L4-L5, which can be a cause of back pain.     Electronically Signed   By: Wiliam Ke M.D.   On: 07/22/2022 02:13  PATIENT SURVEYS:  FOTO 45  COGNITION: Overall cognitive status: Within functional limits for tasks assessed     SENSATION: WFL  POSTURE: rounded shoulders, forward head, and flexed trunk   PALPATION: Tenderness L3-S1  area  LUMBAR ROM:   AROM eval 12/13/22  Flexion 65% available 75% available  Extension 15% available 25% available  Right lateral flexion    Left lateral flexion    Right rotation    Left rotation     (Blank rows = not tested)  LOWER EXTREMITY ROM:     Active  Right eval Left eval  Hip flexion    Hip extension    Hip abduction    Hip adduction    Hip internal rotation    Hip external rotation    Knee flexion    Knee extension    Ankle dorsiflexion    Ankle plantarflexion    Ankle inversion    Ankle eversion     (Blank rows = not tested)  LOWER EXTREMITY MMT:    MMT Right eval Left eval Right 12/13/22 Left 12/13/22  Hip flexion 4 4+ 4+ 4+  Hip extension 3- 3- 3- 3-  Hip abduction      Hip adduction      Hip internal rotation      Hip external rotation      Knee flexion 4+ 4+ 4+ 4-  Knee extension 4+ 5 5 5   Ankle dorsiflexion 4+ 5 5 5   Ankle plantarflexion      Ankle inversion      Ankle eversion       (Blank rows = not tested)  FUNCTIONAL TESTS:  5 times sit to stand: 12.87 sec pushing up on knees some  GAIT: Distance walked: 50 ft in clinic Assistive device utilized: None Level of assistance: Modified independence Comments: forward flexed trunk  TODAY'S TREATMENT:  DATE:  12/13/22 Progress note FOTO 37 5 times sit to stand 11.17 using hands to assist some MMT's and AROM see above Prone manual lumbar distraction (anterior and superior push) 10" hold 2 x 10"  12/07/22 Prone lying over 1 pillow with moist heat x 10' Prone manual lumbar distraction (anterior and superior push) 10" hold 2 x 10" Review of HEP  12/05/22 Prone lying over 1 pillow with moist heat x 10' Prone manual lumbar distraction (anterior and superior push) 10" hold 2 x 10"  12/01/22 Trial of lumbar traction today; 15 minutes; 100 lb max intermittent;  4 steps Prone manual lumbar distraction (anterior and superior push) 10" hold 2 x 10"  11/25/22 Review of HEP and goals Prone manual lumbar distraction (anterior and superior push) 10" hold 2 x 10" Discussion of lumbar traction trial   11/08/22 physical therapy evaluation and HEP instruction    PATIENT EDUCATION:  Education details: Patient educated on exam findings, POC, scope of PT, HEP, and what to expect next visit. Person educated: Patient Education method: Explanation, Demonstration, and Handouts Education comprehension: verbalized understanding, returned demonstration, verbal cues required, and tactile cues required  HOME EXERCISE PROGRAM: Access Code: [email or web address] URL: [email or web address] Date: 11/08/2022 Prepared by: [facility]  Exercises - Supine Lower Trunk Rotation  - 2 x daily - 7 x weekly - 1 sets - 10 reps - Supine Transversus Abdominis Bracing - Hands on Stomach  - 2 x daily - 7 x weekly - 1 sets - 10 reps - Supine Bridge  - 1 x daily - 7 x weekly - 3 sets - 10 reps - Supine March  - 1 x daily - 7 x weekly - 3 sets - 10 reps  ASSESSMENT:  CLINICAL IMPRESSION: Progress note today; patient with recent fall.  Some noted strength improvement but continues with noted hip extension weakness; limited lumbar mobility; continued with manual techniques.  Improved score sit to stand test today but did not quite achieve goal.   Patient will benefit from continued skilled therapy services  to address deficits and promote return to optimal function and to meet partially met and unmet goal.         Eval:Patient is a 72 y.o. male who was seen today for physical therapy evaluation and treatment for M48.062 (ICD-10-CM) - Spinal stenosis, lumbar region with neurogenic claudication.  Patient demonstrates muscle weakness, reduced ROM, and fascial restrictions which are likely contributing to symptoms of pain and are negatively impacting patient ability to perform ADLs and  functional mobility tasks. Patient will benefit from skilled physical therapy services to address these deficits to reduce pain and improve level of function with ADLs and functional mobility tasks.   OBJECTIVE IMPAIRMENTS: Abnormal gait, decreased activity tolerance, difficulty walking, decreased ROM, decreased strength, impaired perceived functional ability, and pain.   ACTIVITY LIMITATIONS: carrying, lifting, bending, sitting, standing, squatting, sleeping, stairs, and locomotion level  PARTICIPATION LIMITATIONS: meal prep, cleaning, laundry, community activity, and yard work  [facility] POTENTIAL: Good  CLINICAL DECISION MAKING: Stable/uncomplicated  EVALUATION COMPLEXITY: Low   GOALS: Goals reviewed with patient? No  SHORT TERM GOALS: Target date: 11/22/2022  patient will be independent with initial HEP  Baseline: Goal status: in progress   2.  Patient will self report 50% improvement to improve tolerance for functional activity  Baseline: "60%" 12/13/22 Goal status: met  LONG TERM GOALS: Target date: 12/09/2022  Patient will be independent in self management strategies to improve quality of life and functional outcomes.  Baseline:  Goal status: IN progress  2.  Patient will self report 75% improvement to improve tolerance for functional activity  Baseline:  Goal status: IN progress  3.  Patient will improve 5 times sit to stand score from 12.87 sec to 11 sec to demonstrate improved functional mobility and increased lower extremity strength.   Baseline: 12/13/22 11.17 sec Goal status: IN progress  4.  Patient will increase  leg MMTs to 4+ to 5/5 without pain to promote return to ambulation community distances with minimal deviation.  Baseline: see above Goal status: IN progress  5.  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 Baseline:  Goal status: IN progress   PLAN:  PT FREQUENCY: 2x/week  PT  DURATION: 4 weeks  PLANNED INTERVENTIONS: Therapeutic exercises, Therapeutic activity, Neuromuscular re-education, Balance training, Gait training, Patient/Family education, Joint manipulation, Joint mobilization, Stair training, Orthotic/Fit training, DME instructions, Aquatic Therapy, Dry Needling, Electrical stimulation, Spinal manipulation, Spinal mobilization, Cryotherapy, Moist heat, Compression bandaging, scar mobilization, Splintting, Taping, Traction, Ultrasound, Ionotophoresis 4mg /ml Dexamethasone, and Manual therapy .  PLAN FOR NEXT SESSION: continue with lumbar manual;  core strengthening and lumbar mobility   11:07 AM, 12/13/22 Santez Woodcox Small Letonya Mangels MPT [place] [facility] [place] [phone] Ph:[phone]

## 2022-12-15 ENCOUNTER — Ambulatory Visit (HOSPITAL_COMMUNITY): Payer: Medicare HMO

## 2022-12-15 DIAGNOSIS — M545 Low back pain, unspecified: Secondary | ICD-10-CM | POA: Diagnosis not present

## 2022-12-15 DIAGNOSIS — G8929 Other chronic pain: Secondary | ICD-10-CM

## 2022-12-15 DIAGNOSIS — Q7649 Other congenital malformations of spine, not associated with scoliosis: Secondary | ICD-10-CM | POA: Diagnosis not present

## 2022-12-15 NOTE — Therapy (Signed)
OUTPATIENT PHYSICAL THERAPY THORACOLUMBAR TREATMENT       Patient Name: Jeremy Mclean MRN: [id number] DOB:[birth year], 72 y.o., male Today's Date: 12/15/2022  END OF SESSION:  PT End of Session - 12/15/22 1104     Visit Number 7    Number of Visits 16    Date for PT Re-Evaluation 12/09/22    Authorization Type [employer]    Authorization Time Period 8 visits approved 10/22-11/22    Authorization - Visit Number 1    Authorization - Number of Visits 8    PT Start Time 1105    PT Stop Time 1145    PT Time Calculation (min) 40 min    Activity Tolerance Patient tolerated treatment well    Behavior During Therapy WFL for tasks assessed/performed             Past Medical History:  Diagnosis Date   AAA (abdominal aortic aneurysm) (HCC)    Anxiety    Arthritis    COPD (chronic obstructive pulmonary disease) (HCC)    Degenerative disorder of bone    Depression    Diverticulitis    Dyspnea    due to COPD and when walking long distances per patient   GERD (gastroesophageal reflux disease)    History of kidney stones    Hypertension    Kidney stone    Spinal stenosis    Past Surgical History:  Procedure Laterality Date   ABDOMINAL AORTIC ANEURYSM REPAIR N/A 01/16/2019   Procedure: OPEN REPAIR OF ABDOMINAL AORTIC ANEURYSM;  Surgeon: Larina Earthly, MD;  Location: [facility] OR;  Service: Vascular;  Laterality: N/A;   BIOPSY  01/14/2017   Procedure: BIOPSY;  Surgeon: West Bali, MD;  Location: [facility];  Service: Endoscopy;;  gastric    CERVICAL SPINE SURGERY     C6-7 ACDF 09/22/09   COLONOSCOPY N/A 04/09/2014   Procedure: COLONOSCOPY;  Surgeon: Malissa Hippo, MD;  Location: [facility];  Service: Endoscopy;  Laterality: N/A;  730 - moved to 7:30 - Ann to notify pt   CYSTOSCOPY     with stent   ESOPHAGOGASTRODUODENOSCOPY N/A 01/14/2017   Procedure: ESOPHAGOGASTRODUODENOSCOPY (EGD);  Surgeon: West Bali, MD;  Location: [facility];  Service: Endoscopy;   Laterality: N/A;   kidney stent     Patient Active Problem List   Diagnosis Date Noted   Lumbar foraminal stenosis 03/03/2022   Sacralization of lumbar vertebra 03/03/2022   Degenerative lumbar spinal stenosis 03/03/2022   Degeneration of lumbar intervertebral disc 03/03/2022   Lumbar spondylosis 03/03/2022   Arthropathy of lumbar facet joint 03/03/2022   Low back pain 08/21/2019   AAA (abdominal aortic aneurysm) (HCC) 01/16/2019   UGIB (upper gastrointestinal bleed)    Acute blood loss anemia 01/13/2017   Melena 01/13/2017   Precordial chest pain 01/13/2017   Neck pain 08/19/2015   Displacement of cervical intervertebral disc without myelopathy 08/19/2015   Cervical radiculopathy 08/19/2015   Iliac artery aneurysm (HCC) 07/11/2014   Tobacco abuse 07/11/2014   History of colonic polyps 03/18/2014   Abdominal aortic aneurysm (HCC) 03/18/2014   Essential hypertension 03/18/2014    PCP: Carylon Perches, MD  REFERRING PROVIDER: Clovis Riley, PA-C  REFERRING DIAG: [phone] (ICD-10-CM) - Spinal stenosis, lumbar region with neurogenic claudication  Rationale for Evaluation and Treatment: Rehabilitation  THERAPY DIAG:  Chronic low back pain, unspecified back pain laterality, unspecified whether sciatica present  Bertolotti's syndrome  ONSET DATE: chronic ongoing back pain  SUBJECTIVE:  SUBJECTIVE STATEMENT: Has some pain in the medial portion of his right ankle this morning when he first got up but "it's ok now"; "walked pretty good yesterday".  Trying to eat better.  Able to walk around the grocery store without his hip locking up ~ 30 minutes.  Feeling "hung over" today.   Still taking tramadol every day.    PERTINENT HISTORY:  Right knee fracture last year  PAIN:  Are you having pain?  Yes: NPRS scale: 1-10/10 Pain location: low back Pain description: low back and right hip Aggravating factors: standing, walking Relieving factors: sitting, rest  PRECAUTIONS: Fall    WEIGHT BEARING RESTRICTIONS: No  FALLS:  Has patient fallen in last 6 months? Yes. Number of falls 1 tripped outside  OCCUPATION: retired and active on his farm  PLOF: Independent  PATIENT GOALS: less pain  NEXT MD VISIT: PRN  OBJECTIVE:   DIAGNOSTIC FINDINGS:  CLINICAL DATA:  Foraminal stenosis of lumbar region   EXAM: MRI LUMBAR SPINE WITHOUT CONTRAST   TECHNIQUE: Multiplanar, multisequence MR imaging of the lumbar spine was performed. No intravenous contrast was administered.   COMPARISON:  03/12/2021   FINDINGS: Segmentation: 5 lumbar type vertebral bodies. Pseudoarticulation of elongated L5 transverse processes with the sacral ala.   Alignment: Dextrocurvature. Trace retrolisthesis of L2 on L3 and L3 on L4, unchanged. Trace anterolisthesis of L4 on L5, unchanged.   Vertebrae:  L1   Conus medullaris and cauda equina: Conus extends to the No acute fracture, evidence of discitis, or suspicious osseous lesion. level. Conus and cauda equina appear normal.   Paraspinal and other soft tissues: The infrarenal abdominal aorta measures up to 3 cm (series 5, image 9). Aortic atherosclerosis. Multiple renal cysts, for which no follow-up is currently indicated. No lymphadenopathy.   Disc levels:   T12-L1: No significant disc bulge. No spinal canal stenosis or neural foraminal narrowing.   L1-L2: Minimal disc bulge. Mild facet arthropathy. No spinal canal stenosis or neural foraminal narrowing.   L2-L3: Trace retrolisthesis and mild disc bulge with superimposed right foraminal extrusion with 6 mm of cranial migration, new from the prior exam. Moderate facet arthropathy. Narrowing of the lateral recesses. Ligamentum flavum hypertrophy. Mild-to-moderate spinal canal stenosis,  unchanged. Moderate right neural foraminal narrowing, progressed from the prior exam.   L3-L4: Trace retrolisthesis with mild disc bulge and superimposed right subarticular disc extrusion with 7 mm of caudal migration, unchanged. This likely contacts the descending right L4 nerve roots. Moderate facet arthropathy. Ligamentum flavum hypertrophy. Narrowing of the lateral recesses. Mild spinal canal stenosis. Moderate left neural foraminal narrowing, unchanged.   L4-L5: Trace anterolisthesis with disc unroofing and mild disc bulge. Severe facet arthropathy. No spinal canal stenosis or neural foraminal narrowing.   L5-S1: Mild disc bulge with superimposed right paracentral protrusion. Moderate facet arthropathy. No spinal canal stenosis. Mild right neural foraminal narrowing, unchanged.   IMPRESSION: 1. L2-L3 mild-to-moderate spinal canal stenosis, unchanged, and moderate right neural foraminal narrowing, which has progressed from the prior exam. Narrowing of the lateral recesses at this level could affect the descending L3 nerve roots. 2. L3-L4 mild spinal canal stenosis and moderate left neural foraminal narrowing, unchanged. Narrowing of the lateral recesses at this level could affect the descending L4 nerve roots. In addition, a disc extrusion at this level likely contacts the descending right L4 nerve roots. 3. L5-S1 mild right neural foraminal narrowing. 4. Multilevel facet arthropathy, which is severe at L4-L5, which can be a cause of back pain.  Electronically Signed   By: Wiliam Ke M.D.   On: 07/22/2022 02:13  PATIENT SURVEYS:  FOTO 45  COGNITION: Overall cognitive status: Within functional limits for tasks assessed     SENSATION: WFL  POSTURE: rounded shoulders, forward head, and flexed trunk   PALPATION: Tenderness L3-S1 area  LUMBAR ROM:   AROM eval 12/13/22  Flexion 65% available 75% available  Extension 15% available 25% available  Right  lateral flexion    Left lateral flexion    Right rotation    Left rotation     (Blank rows = not tested)  LOWER EXTREMITY ROM:     Active  Right eval Left eval  Hip flexion    Hip extension    Hip abduction    Hip adduction    Hip internal rotation    Hip external rotation    Knee flexion    Knee extension    Ankle dorsiflexion    Ankle plantarflexion    Ankle inversion    Ankle eversion     (Blank rows = not tested)  LOWER EXTREMITY MMT:    MMT Right eval Left eval Right 12/13/22 Left 12/13/22  Hip flexion 4 4+ 4+ 4+  Hip extension 3- 3- 3- 3-  Hip abduction      Hip adduction      Hip internal rotation      Hip external rotation      Knee flexion 4+ 4+ 4+ 4-  Knee extension 4+ 5 5 5   Ankle dorsiflexion 4+ 5 5 5   Ankle plantarflexion      Ankle inversion      Ankle eversion       (Blank rows = not tested)  FUNCTIONAL TESTS:  5 times sit to stand: 12.87 sec pushing up on knees some  GAIT: Distance walked: 50 ft in clinic Assistive device utilized: None Level of assistance: Modified independence Comments: forward flexed trunk  TODAY'S TREATMENT:                                                                                                                              DATE:  12/15/22 Standing: Hip extension x 10  Prone lying with pillow under hips x 5' with moist heat  Prone manual lumbar distraction (anterior and superior push) 10" hold 2 x 10"  12/13/22 Progress note FOTO 37 5 times sit to stand 11.17 using hands to assist some MMT's and AROM see above Prone manual lumbar distraction (anterior and superior push) 10" hold 2 x 10"  12/07/22 Prone lying over 1 pillow with moist heat x 10' Prone manual lumbar distraction (anterior and superior push) 10" hold 2 x 10" Review of HEP  12/05/22 Prone lying over 1 pillow with moist heat x 10' Prone manual lumbar distraction (anterior and superior push) 10" hold 2 x 10"  12/01/22 Trial of lumbar  traction today; 15 minutes; 100 lb max intermittent; 4 steps Prone manual lumbar  distraction (anterior and superior push) 10" hold 2 x 10"  11/25/22 Review of HEP and goals Prone manual lumbar distraction (anterior and superior push) 10" hold 2 x 10" Discussion of lumbar traction trial   11/08/22 physical therapy evaluation and HEP instruction    PATIENT EDUCATION:  Education details: Patient educated on exam findings, POC, scope of PT, HEP, and what to expect next visit. Person educated: Patient Education method: Explanation, Demonstration, and Handouts Education comprehension: verbalized understanding, returned demonstration, verbal cues required, and tactile cues required  HOME EXERCISE PROGRAM: Access Code: [email or web address] URL: [email or web address] Date: 11/08/2022 Prepared by: [facility]  Exercises - Supine Lower Trunk Rotation  - 2 x daily - 7 x weekly - 1 sets - 10 reps - Supine Transversus Abdominis Bracing - Hands on Stomach  - 2 x daily - 7 x weekly - 1 sets - 10 reps - Supine Bridge  - 1 x daily - 7 x weekly - 3 sets - 10 reps - Supine March  - 1 x daily - 7 x weekly - 3 sets - 10 reps  12/15/22 standing hip extension at counter    ASSESSMENT:  CLINICAL IMPRESSION: Today's session continued with focus on manual therapy to relieve pain symptoms in back and right hip; added hip extensions today to HEP due to continued hip extension weakness at progress visit last visit.   Patient will benefit from continued skilled therapy services  to address deficits and promote return to optimal function and to meet partially met and unmet goal.         Eval:Patient is a 72 y.o. male who was seen today for physical therapy evaluation and treatment for M48.062 (ICD-10-CM) - Spinal stenosis, lumbar region with neurogenic claudication.  Patient demonstrates muscle weakness, reduced ROM, and fascial restrictions which are likely contributing to symptoms of pain and are  negatively impacting patient ability to perform ADLs and functional mobility tasks. Patient will benefit from skilled physical therapy services to address these deficits to reduce pain and improve level of function with ADLs and functional mobility tasks.   OBJECTIVE IMPAIRMENTS: Abnormal gait, decreased activity tolerance, difficulty walking, decreased ROM, decreased strength, impaired perceived functional ability, and pain.   ACTIVITY LIMITATIONS: carrying, lifting, bending, sitting, standing, squatting, sleeping, stairs, and locomotion level  PARTICIPATION LIMITATIONS: meal prep, cleaning, laundry, community activity, and yard work  [facility] POTENTIAL: Good  CLINICAL DECISION MAKING: Stable/uncomplicated  EVALUATION COMPLEXITY: Low   GOALS: Goals reviewed with patient? No  SHORT TERM GOALS: Target date: 11/22/2022  patient will be independent with initial HEP  Baseline: Goal status: in progress   2.  Patient will self report 50% improvement to improve tolerance for functional activity  Baseline: "60%" 12/13/22 Goal status: met  LONG TERM GOALS: Target date: 12/09/2022  Patient will be independent in self management strategies to improve quality of life and functional outcomes.   Baseline:  Goal status: IN progress  2.  Patient will self report 75% improvement to improve tolerance for functional activity  Baseline:  Goal status: IN progress  3.  Patient will improve 5 times sit to stand score from 12.87 sec to 11 sec to demonstrate improved functional mobility and increased lower extremity strength.   Baseline: 12/13/22 11.17 sec Goal status: IN progress  4.  Patient will increase  leg MMTs to 4+ to 5/5 without pain to promote return to ambulation community distances with minimal deviation.  Baseline: see above Goal status: IN progress  5.  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 Baseline:  Goal status:  IN progress   PLAN:  PT FREQUENCY: 2x/week  PT DURATION: 4 weeks  PLANNED INTERVENTIONS: Therapeutic exercises, Therapeutic activity, Neuromuscular re-education, Balance training, Gait training, Patient/Family education, Joint manipulation, Joint mobilization, Stair training, Orthotic/Fit training, DME instructions, Aquatic Therapy, Dry Needling, Electrical stimulation, Spinal manipulation, Spinal mobilization, Cryotherapy, Moist heat, Compression bandaging, scar mobilization, Splintting, Taping, Traction, Ultrasound, Ionotophoresis 4mg /ml Dexamethasone, and Manual therapy .  PLAN FOR NEXT SESSION: continue with lumbar manual;  core strengthening and lumbar mobility   11:44 AM, 12/15/22 Lakelyn Straus Small Mitsuye Schrodt MPT [facility] [place] [phone] Ph:[phone]

## 2022-12-19 ENCOUNTER — Encounter (HOSPITAL_COMMUNITY): Payer: Medicare PPO

## 2022-12-21 ENCOUNTER — Ambulatory Visit (HOSPITAL_COMMUNITY): Payer: Medicare HMO

## 2022-12-21 DIAGNOSIS — M545 Low back pain, unspecified: Secondary | ICD-10-CM | POA: Diagnosis not present

## 2022-12-21 DIAGNOSIS — G8929 Other chronic pain: Secondary | ICD-10-CM | POA: Diagnosis not present

## 2022-12-21 DIAGNOSIS — Q7649 Other congenital malformations of spine, not associated with scoliosis: Secondary | ICD-10-CM | POA: Diagnosis not present

## 2022-12-21 NOTE — Therapy (Signed)
OUTPATIENT PHYSICAL THERAPY THORACOLUMBAR TREATMENT       Patient Name: Jeremy Mclean MRN: [id number] DOB:1950-12-03, 72 y.o., male Today's Date: 12/21/2022  END OF SESSION:  PT End of Session - 12/21/22 1059     Visit Number 8    Number of Visits 16    Date for PT Re-Evaluation 12/09/22    Authorization Type [employer]    Authorization Time Period 8 visits approved 10/22-11/22    Authorization - Visit Number 2    Authorization - Number of Visits 8    Progress Note Due on Visit 8    PT Start Time 1059    PT Stop Time 1140    PT Time Calculation (min) 41 min    Activity Tolerance Patient tolerated treatment well    Behavior During Therapy WFL for tasks assessed/performed             Past Medical History:  Diagnosis Date   AAA (abdominal aortic aneurysm) (HCC)    Anxiety    Arthritis    COPD (chronic obstructive pulmonary disease) (HCC)    Degenerative disorder of bone    Depression    Diverticulitis    Dyspnea    due to COPD and when walking long distances per patient   GERD (gastroesophageal reflux disease)    History of kidney stones    Hypertension    Kidney stone    Spinal stenosis    Past Surgical History:  Procedure Laterality Date   ABDOMINAL AORTIC ANEURYSM REPAIR N/A 01/16/2019   Procedure: OPEN REPAIR OF ABDOMINAL AORTIC ANEURYSM;  Surgeon: Larina Earthly, MD;  Location: [facility] OR;  Service: Vascular;  Laterality: N/A;   BIOPSY  01/14/2017   Procedure: BIOPSY;  Surgeon: West Bali, MD;  Location: [facility] SUITE;  Service: Endoscopy;;  gastric    CERVICAL SPINE SURGERY     C6-7 ACDF 09/22/09   COLONOSCOPY N/A 04/09/2014   Procedure: COLONOSCOPY;  Surgeon: Malissa Hippo, MD;  Location: [facility];  Service: Endoscopy;  Laterality: N/A;  730 - moved to 7:30 - Ann to notify pt   CYSTOSCOPY     with stent   ESOPHAGOGASTRODUODENOSCOPY N/A 01/14/2017   Procedure: ESOPHAGOGASTRODUODENOSCOPY (EGD);  Surgeon: West Bali, MD;  Location: [facility];  Service: Endoscopy;  Laterality: N/A;   kidney stent     Patient Active Problem List   Diagnosis Date Noted   Lumbar foraminal stenosis 03/03/2022   Sacralization of lumbar vertebra 03/03/2022   Degenerative lumbar spinal stenosis 03/03/2022   Degeneration of lumbar intervertebral disc 03/03/2022   Lumbar spondylosis 03/03/2022   Arthropathy of lumbar facet joint 03/03/2022   Low back pain 08/21/2019   AAA (abdominal aortic aneurysm) (HCC) 01/16/2019   UGIB (upper gastrointestinal bleed)    Acute blood loss anemia 01/13/2017   Melena 01/13/2017   Precordial chest pain 01/13/2017   Neck pain 08/19/2015   Displacement of cervical intervertebral disc without myelopathy 08/19/2015   Cervical radiculopathy 08/19/2015   Iliac artery aneurysm (HCC) 07/11/2014   Tobacco abuse 07/11/2014   History of colonic polyps 03/18/2014   Abdominal aortic aneurysm (HCC) 03/18/2014   Essential hypertension 03/18/2014    PCP: Carylon Perches, MD  REFERRING PROVIDER: Clovis Riley, PA-C  REFERRING DIAG: [phone] (ICD-10-CM) - Spinal stenosis, lumbar region with neurogenic claudication  Rationale for Evaluation and Treatment: Rehabilitation  THERAPY DIAG:  Chronic low back pain, unspecified back pain laterality, unspecified whether sciatica present  Bertolotti's syndrome  ONSET DATE: chronic ongoing back pain  SUBJECTIVE:                                                                                                                                                                                           SUBJECTIVE STATEMENT: Reports not feeling well today; "legs are weak"; 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  Taking diazepam at night to sleep; took 2 last night.  Feels like the bottom of his legs are bothering him more; Took tramadol x 2 this morning.     PERTINENT HISTORY:  Right knee fracture last year  PAIN:  Are you  having pain? Yes: NPRS scale: 1-10/10 Pain location: low back Pain description: low back and right hip Aggravating factors: standing, walking Relieving factors: sitting, rest  PRECAUTIONS: Fall    WEIGHT BEARING RESTRICTIONS: No  FALLS:  Has patient fallen in last 6 months? Yes. Number of falls 1 tripped outside  OCCUPATION: retired and active on his farm  PLOF: Independent  PATIENT GOALS: less pain  NEXT MD VISIT: PRN  OBJECTIVE:   DIAGNOSTIC FINDINGS:  CLINICAL DATA:  Foraminal stenosis of lumbar region   EXAM: MRI LUMBAR SPINE WITHOUT CONTRAST   TECHNIQUE: Multiplanar, multisequence MR imaging of the lumbar spine was performed. No intravenous contrast was administered.   COMPARISON:  03/12/2021   FINDINGS: Segmentation: 5 lumbar type vertebral bodies. Pseudoarticulation of elongated L5 transverse processes with the sacral ala.   Alignment: Dextrocurvature. Trace retrolisthesis of L2 on L3 and L3 on L4, unchanged. Trace anterolisthesis of L4 on L5, unchanged.   Vertebrae:  L1   Conus medullaris and cauda equina: Conus extends to the No acute fracture, evidence of discitis, or suspicious osseous lesion. level. Conus and cauda equina appear normal.   Paraspinal and other soft tissues: The infrarenal abdominal aorta measures up to 3 cm (series 5, image 9). Aortic atherosclerosis. Multiple renal cysts, for which no follow-up is currently indicated. No lymphadenopathy.   Disc levels:   T12-L1: No significant disc bulge. No spinal canal stenosis or neural foraminal narrowing.   L1-L2: Minimal disc bulge. Mild facet arthropathy. No spinal canal stenosis or neural foraminal narrowing.   L2-L3: Trace retrolisthesis and mild disc bulge with superimposed right foraminal extrusion with 6 mm of cranial migration, new from the prior exam. Moderate facet arthropathy. Narrowing of the lateral recesses. Ligamentum flavum hypertrophy. Mild-to-moderate spinal canal  stenosis, unchanged. Moderate right neural foraminal narrowing, progressed from the prior exam.   L3-L4: Trace retrolisthesis with mild disc bulge and superimposed right subarticular disc extrusion  with 7 mm of caudal migration, unchanged. This likely contacts the descending right L4 nerve roots. Moderate facet arthropathy. Ligamentum flavum hypertrophy. Narrowing of the lateral recesses. Mild spinal canal stenosis. Moderate left neural foraminal narrowing, unchanged.   L4-L5: Trace anterolisthesis with disc unroofing and mild disc bulge. Severe facet arthropathy. No spinal canal stenosis or neural foraminal narrowing.   L5-S1: Mild disc bulge with superimposed right paracentral protrusion. Moderate facet arthropathy. No spinal canal stenosis. Mild right neural foraminal narrowing, unchanged.   IMPRESSION: 1. L2-L3 mild-to-moderate spinal canal stenosis, unchanged, and moderate right neural foraminal narrowing, which has progressed from the prior exam. Narrowing of the lateral recesses at this level could affect the descending L3 nerve roots. 2. L3-L4 mild spinal canal stenosis and moderate left neural foraminal narrowing, unchanged. Narrowing of the lateral recesses at this level could affect the descending L4 nerve roots. In addition, a disc extrusion at this level likely contacts the descending right L4 nerve roots. 3. L5-S1 mild right neural foraminal narrowing. 4. Multilevel facet arthropathy, which is severe at L4-L5, which can be a cause of back pain.     Electronically Signed   By: Wiliam Ke M.D.   On: 07/22/2022 02:13  PATIENT SURVEYS:  FOTO 45  COGNITION: Overall cognitive status: Within functional limits for tasks assessed     SENSATION: WFL  POSTURE: rounded shoulders, forward head, and flexed trunk   PALPATION: Tenderness L3-S1 area  LUMBAR ROM:   AROM eval 12/13/22  Flexion 65% available 75% available  Extension 15% available 25% available   Right lateral flexion    Left lateral flexion    Right rotation    Left rotation     (Blank rows = not tested)  LOWER EXTREMITY ROM:     Active  Right eval Left eval  Hip flexion    Hip extension    Hip abduction    Hip adduction    Hip internal rotation    Hip external rotation    Knee flexion    Knee extension    Ankle dorsiflexion    Ankle plantarflexion    Ankle inversion    Ankle eversion     (Blank rows = not tested)  LOWER EXTREMITY MMT:    MMT Right eval Left eval Right 12/13/22 Left 12/13/22  Hip flexion 4 4+ 4+ 4+  Hip extension 3- 3- 3- 3-  Hip abduction      Hip adduction      Hip internal rotation      Hip external rotation      Knee flexion 4+ 4+ 4+ 4-  Knee extension 4+ 5 5 5   Ankle dorsiflexion 4+ 5 5 5   Ankle plantarflexion      Ankle inversion      Ankle eversion       (Blank rows = not tested)  FUNCTIONAL TESTS:  5 times sit to stand: 12.87 sec pushing up on knees some  GAIT: Distance walked: 50 ft in clinic Assistive device utilized: None Level of assistance: Modified independence Comments: forward flexed trunk  TODAY'S TREATMENT:  DATE:  12/21/22 Standing: Hip extension x 10 each Heel raises x 10 Slant board 5 x 20" Prone: Prone manual lumbar distraction (anterior and superior push) 10" hold 2 x 10"  12/15/22 Standing: Hip extension x 10  Prone lying with pillow under hips x 5' with moist heat  Prone manual lumbar distraction (anterior and superior push) 10" hold 2 x 10"  12/13/22 Progress note FOTO 37 5 times sit to stand 11.17 using hands to assist some MMT's and AROM see above Prone manual lumbar distraction (anterior and superior push) 10" hold 2 x 10"  12/07/22 Prone lying over 1 pillow with moist heat x 10' Prone manual lumbar distraction (anterior and superior push) 10" hold 2 x  10" Review of HEP  12/05/22 Prone lying over 1 pillow with moist heat x 10' Prone manual lumbar distraction (anterior and superior push) 10" hold 2 x 10"  12/01/22 Trial of lumbar traction today; 15 minutes; 100 lb max intermittent; 4 steps Prone manual lumbar distraction (anterior and superior push) 10" hold 2 x 10"  11/25/22 Review of HEP and goals Prone manual lumbar distraction (anterior and superior push) 10" hold 2 x 10" Discussion of lumbar traction trial   11/08/22 physical therapy evaluation and HEP instruction    PATIENT EDUCATION:  Education details: Patient educated on exam findings, POC, scope of PT, HEP, and what to expect next visit. Person educated: Patient Education method: Explanation, Demonstration, and Handouts Education comprehension: verbalized understanding, returned demonstration, verbal cues required, and tactile cues required  HOME EXERCISE PROGRAM: Access Code: [email or web address] URL: [email or web address] Date: 11/08/2022 Prepared by: [facility]  Exercises - Supine Lower Trunk Rotation  - 2 x daily - 7 x weekly - 1 sets - 10 reps - Supine Transversus Abdominis Bracing - Hands on Stomach  - 2 x daily - 7 x weekly - 1 sets - 10 reps - Supine Bridge  - 1 x daily - 7 x weekly - 3 sets - 10 reps - Supine March  - 1 x daily - 7 x weekly - 3 sets - 10 reps  12/15/22 standing hip extension at counter  12/21/22 - Heel Raises with Counter Support  - 1 x daily - 7 x weekly - 1 sets - 10 reps - Standing Gastroc Stretch on Step  - 1 x daily - 7 x weekly - 1 sets - 10 reps - 20" hold   ASSESSMENT:  CLINICAL IMPRESSION: Today's session continued with focus on manual therapy to relieve pain symptoms in back and right hip; progress lower extremity strengthening and stretching today; updated HEP.  Patient with noted slower movement and some imbalance today unsure if related to polypharmacy or lack of sleep. States he is going to "take a nap" when he gets  home.   Patient will benefit from continued skilled therapy services  to address deficits and promote return to optimal function and to meet partially met and unmet goal.         Eval:Patient is a 72 y.o. male who was seen today for physical therapy evaluation and treatment for M48.062 (ICD-10-CM) - Spinal stenosis, lumbar region with neurogenic claudication.  Patient demonstrates muscle weakness, reduced ROM, and fascial restrictions which are likely contributing to symptoms of pain and are negatively impacting patient ability to perform ADLs and functional mobility tasks. Patient will benefit from skilled physical therapy services to address these deficits to reduce pain and improve level of function with ADLs and functional mobility tasks.  OBJECTIVE IMPAIRMENTS: Abnormal gait, decreased activity tolerance, difficulty walking, decreased ROM, decreased strength, impaired perceived functional ability, and pain.   ACTIVITY LIMITATIONS: carrying, lifting, bending, sitting, standing, squatting, sleeping, stairs, and locomotion level  PARTICIPATION LIMITATIONS: meal prep, cleaning, laundry, community activity, and yard work  [facility] POTENTIAL: Good  CLINICAL DECISION MAKING: Stable/uncomplicated  EVALUATION COMPLEXITY: Low   GOALS: Goals reviewed with patient? No  SHORT TERM GOALS: Target date: 11/22/2022  patient will be independent with initial HEP  Baseline: Goal status: in progress   2.  Patient will self report 50% improvement to improve tolerance for functional activity  Baseline: "60%" 12/13/22 Goal status: met  LONG TERM GOALS: Target date: 12/09/2022  Patient will be independent in self management strategies to improve quality of life and functional outcomes.   Baseline:  Goal status: IN progress  2.  Patient will self report 75% improvement to improve tolerance for functional activity  Baseline:  Goal status: IN progress  3.  Patient will improve 5 times sit to  stand score from 12.87 sec to 11 sec to demonstrate improved functional mobility and increased lower extremity strength.   Baseline: 12/13/22 11.17 sec Goal status: IN progress  4.  Patient will increase  leg MMTs to 4+ to 5/5 without pain to promote return to ambulation community distances with minimal deviation.  Baseline: see above Goal status: IN progress  5.  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 Baseline:  Goal status: IN progress   PLAN:  PT FREQUENCY: 2x/week  PT DURATION: 4 weeks  PLANNED INTERVENTIONS: Therapeutic exercises, Therapeutic activity, Neuromuscular re-education, Balance training, Gait training, Patient/Family education, Joint manipulation, Joint mobilization, Stair training, Orthotic/Fit training, DME instructions, Aquatic Therapy, Dry Needling, Electrical stimulation, Spinal manipulation, Spinal mobilization, Cryotherapy, Moist heat, Compression bandaging, scar mobilization, Splintting, Taping, Traction, Ultrasound, Ionotophoresis 4mg /ml Dexamethasone, and Manual therapy .  PLAN FOR NEXT SESSION: continue with lumbar manual;  core strengthening and lumbar mobility   11:14 AM, 12/21/22 Larsen Zettel Small Marcayla Budge MPT [facility] [place] [phone] Ph:[phone]

## 2022-12-23 DIAGNOSIS — J189 Pneumonia, unspecified organism: Secondary | ICD-10-CM | POA: Diagnosis not present

## 2022-12-23 DIAGNOSIS — J441 Chronic obstructive pulmonary disease with (acute) exacerbation: Secondary | ICD-10-CM | POA: Diagnosis not present

## 2022-12-23 DIAGNOSIS — U071 COVID-19: Secondary | ICD-10-CM | POA: Diagnosis not present

## 2022-12-23 DIAGNOSIS — R051 Acute cough: Secondary | ICD-10-CM | POA: Diagnosis not present

## 2022-12-23 DIAGNOSIS — J209 Acute bronchitis, unspecified: Secondary | ICD-10-CM | POA: Diagnosis not present

## 2022-12-27 ENCOUNTER — Encounter (HOSPITAL_COMMUNITY): Payer: Medicare HMO

## 2022-12-29 ENCOUNTER — Encounter (HOSPITAL_COMMUNITY): Payer: Medicare HMO

## 2023-01-03 DIAGNOSIS — J441 Chronic obstructive pulmonary disease with (acute) exacerbation: Secondary | ICD-10-CM | POA: Diagnosis not present

## 2023-01-03 DIAGNOSIS — R059 Cough, unspecified: Secondary | ICD-10-CM | POA: Diagnosis not present

## 2023-01-03 DIAGNOSIS — J9801 Acute bronchospasm: Secondary | ICD-10-CM | POA: Diagnosis not present

## 2023-01-03 DIAGNOSIS — Z6838 Body mass index (BMI) 38.0-38.9, adult: Secondary | ICD-10-CM | POA: Diagnosis not present

## 2023-01-03 DIAGNOSIS — R030 Elevated blood-pressure reading, without diagnosis of hypertension: Secondary | ICD-10-CM | POA: Diagnosis not present

## 2023-01-03 DIAGNOSIS — J209 Acute bronchitis, unspecified: Secondary | ICD-10-CM | POA: Diagnosis not present

## 2023-01-03 DIAGNOSIS — E669 Obesity, unspecified: Secondary | ICD-10-CM | POA: Diagnosis not present

## 2023-01-04 ENCOUNTER — Ambulatory Visit (HOSPITAL_COMMUNITY): Payer: Medicare HMO | Attending: Surgery

## 2023-01-04 DIAGNOSIS — M545 Low back pain, unspecified: Secondary | ICD-10-CM | POA: Diagnosis not present

## 2023-01-04 DIAGNOSIS — G8929 Other chronic pain: Secondary | ICD-10-CM | POA: Insufficient documentation

## 2023-01-04 DIAGNOSIS — Q7649 Other congenital malformations of spine, not associated with scoliosis: Secondary | ICD-10-CM | POA: Insufficient documentation

## 2023-01-04 NOTE — Therapy (Addendum)
OUTPATIENT PHYSICAL THERAPY THORACOLUMBAR TREATMENT       Patient Name: Jeremy Mclean MRN: [id number] DOB:1950-08-30, 72 y.o., male Today's Date: 01/04/2023  END OF SESSION:  PT End of Session - 01/04/23 1147     Visit Number 9    Number of Visits 16    Date for PT Re-Evaluation 12/09/22    Authorization Type [employer]    Authorization Time Period 8 visits approved 10/22-11/22    Authorization - Visit Number 3    Authorization - Number of Visits 8    Progress Note Due on Visit 8    PT Start Time 1147    PT Stop Time 1212    PT Time Calculation (min) 25 min    Activity Tolerance Patient tolerated treatment well    Behavior During Therapy WFL for tasks assessed/performed             Past Medical History:  Diagnosis Date   AAA (abdominal aortic aneurysm) (HCC)    Anxiety    Arthritis    COPD (chronic obstructive pulmonary disease) (HCC)    Degenerative disorder of bone    Depression    Diverticulitis    Dyspnea    due to COPD and when walking long distances per patient   GERD (gastroesophageal reflux disease)    History of kidney stones    Hypertension    Kidney stone    Spinal stenosis    Past Surgical History:  Procedure Laterality Date   ABDOMINAL AORTIC ANEURYSM REPAIR N/A 01/16/2019   Procedure: OPEN REPAIR OF ABDOMINAL AORTIC ANEURYSM;  Surgeon: Larina Earthly, MD;  Location: [facility] OR;  Service: Vascular;  Laterality: N/A;   BIOPSY  01/14/2017   Procedure: BIOPSY;  Surgeon: West Bali, MD;  Location: [facility] SUITE;  Service: Endoscopy;;  gastric    CERVICAL SPINE SURGERY     C6-7 ACDF 09/22/09   COLONOSCOPY N/A 04/09/2014   Procedure: COLONOSCOPY;  Surgeon: Malissa Hippo, MD;  Location: [facility];  Service: Endoscopy;  Laterality: N/A;  730 - moved to 7:30 - Ann to notify pt   CYSTOSCOPY     with stent   ESOPHAGOGASTRODUODENOSCOPY N/A 01/14/2017   Procedure: ESOPHAGOGASTRODUODENOSCOPY (EGD);  Surgeon: West Bali, MD;  Location: [facility];  Service: Endoscopy;  Laterality: N/A;   kidney stent     Patient Active Problem List   Diagnosis Date Noted   Lumbar foraminal stenosis 03/03/2022   Sacralization of lumbar vertebra 03/03/2022   Degenerative lumbar spinal stenosis 03/03/2022   Degeneration of lumbar intervertebral disc 03/03/2022   Lumbar spondylosis 03/03/2022   Arthropathy of lumbar facet joint 03/03/2022   Low back pain 08/21/2019   AAA (abdominal aortic aneurysm) (HCC) 01/16/2019   UGIB (upper gastrointestinal bleed)    Acute blood loss anemia 01/13/2017   Melena 01/13/2017   Precordial chest pain 01/13/2017   Neck pain 08/19/2015   Displacement of cervical intervertebral disc without myelopathy 08/19/2015   Cervical radiculopathy 08/19/2015   Iliac artery aneurysm (HCC) 07/11/2014   Tobacco abuse 07/11/2014   History of colonic polyps 03/18/2014   Abdominal aortic aneurysm (HCC) 03/18/2014   Essential hypertension 03/18/2014    PCP: Carylon Perches, MD  REFERRING PROVIDER: Clovis Riley, PA-C  REFERRING DIAG: [phone] (ICD-10-CM) - Spinal stenosis, lumbar region with neurogenic claudication  Rationale for Evaluation and Treatment: Rehabilitation  THERAPY DIAG:  Chronic low back pain, unspecified back pain laterality, unspecified whether sciatica present  Bertolotti's syndrome  ONSET DATE: chronic ongoing back pain  SUBJECTIVE:                                                                                                                                                                                           SUBJECTIVE STATEMENT: Patient reports he had pneumonia this past week; "I'm weak as water"; had to cancel his appointments last week.  Patient said he felt good after last treatment and did some work outdoors; however he woke up feeling badly the next morning; trouble breathing.  Had a chest x-ray at urgent care and they started him on antibiotic/z pack.  Feeling better as far as  breathing but is still really weak.  Reports some hip pain today.    PERTINENT HISTORY:  Right knee fracture last year  PAIN:  Are you having pain? Yes: NPRS scale: 1-10/10 Pain location: low back Pain description: low back and right hip Aggravating factors: standing, walking Relieving factors: sitting, rest  PRECAUTIONS: Fall    WEIGHT BEARING RESTRICTIONS: No  FALLS:  Has patient fallen in last 6 months? Yes. Number of falls 1 tripped outside  OCCUPATION: retired and active on his farm  PLOF: Independent  PATIENT GOALS: less pain  NEXT MD VISIT: PRN  OBJECTIVE:   DIAGNOSTIC FINDINGS:  CLINICAL DATA:  Foraminal stenosis of lumbar region   EXAM: MRI LUMBAR SPINE WITHOUT CONTRAST   TECHNIQUE: Multiplanar, multisequence MR imaging of the lumbar spine was performed. No intravenous contrast was administered.   COMPARISON:  03/12/2021   FINDINGS: Segmentation: 5 lumbar type vertebral bodies. Pseudoarticulation of elongated L5 transverse processes with the sacral ala.   Alignment: Dextrocurvature. Trace retrolisthesis of L2 on L3 and L3 on L4, unchanged. Trace anterolisthesis of L4 on L5, unchanged.   Vertebrae:  L1   Conus medullaris and cauda equina: Conus extends to the No acute fracture, evidence of discitis, or suspicious osseous lesion. level. Conus and cauda equina appear normal.   Paraspinal and other soft tissues: The infrarenal abdominal aorta measures up to 3 cm (series 5, image 9). Aortic atherosclerosis. Multiple renal cysts, for which no follow-up is currently indicated. No lymphadenopathy.   Disc levels:   T12-L1: No significant disc bulge. No spinal canal stenosis or neural foraminal narrowing.   L1-L2: Minimal disc bulge. Mild facet arthropathy. No spinal canal stenosis or neural foraminal narrowing.   L2-L3: Trace retrolisthesis and mild disc bulge with superimposed right foraminal extrusion with 6 mm of cranial migration, new  from the prior exam. Moderate facet arthropathy. Narrowing of the lateral recesses. Ligamentum flavum hypertrophy. Mild-to-moderate spinal canal stenosis, unchanged. Moderate right neural foraminal narrowing,  progressed from the prior exam.   L3-L4: Trace retrolisthesis with mild disc bulge and superimposed right subarticular disc extrusion with 7 mm of caudal migration, unchanged. This likely contacts the descending right L4 nerve roots. Moderate facet arthropathy. Ligamentum flavum hypertrophy. Narrowing of the lateral recesses. Mild spinal canal stenosis. Moderate left neural foraminal narrowing, unchanged.   L4-L5: Trace anterolisthesis with disc unroofing and mild disc bulge. Severe facet arthropathy. No spinal canal stenosis or neural foraminal narrowing.   L5-S1: Mild disc bulge with superimposed right paracentral protrusion. Moderate facet arthropathy. No spinal canal stenosis. Mild right neural foraminal narrowing, unchanged.   IMPRESSION: 1. L2-L3 mild-to-moderate spinal canal stenosis, unchanged, and moderate right neural foraminal narrowing, which has progressed from the prior exam. Narrowing of the lateral recesses at this level could affect the descending L3 nerve roots. 2. L3-L4 mild spinal canal stenosis and moderate left neural foraminal narrowing, unchanged. Narrowing of the lateral recesses at this level could affect the descending L4 nerve roots. In addition, a disc extrusion at this level likely contacts the descending right L4 nerve roots. 3. L5-S1 mild right neural foraminal narrowing. 4. Multilevel facet arthropathy, which is severe at L4-L5, which can be a cause of back pain.     Electronically Signed   By: Wiliam Ke M.D.   On: 07/22/2022 02:13  PATIENT SURVEYS:  FOTO 45  COGNITION: Overall cognitive status: Within functional limits for tasks assessed     SENSATION: WFL  POSTURE: rounded shoulders, forward head, and flexed trunk    PALPATION: Tenderness L3-S1 area  LUMBAR ROM:   AROM eval 12/13/22  Flexion 65% available 75% available  Extension 15% available 25% available  Right lateral flexion    Left lateral flexion    Right rotation    Left rotation     (Blank rows = not tested)  LOWER EXTREMITY ROM:     Active  Right eval Left eval  Hip flexion    Hip extension    Hip abduction    Hip adduction    Hip internal rotation    Hip external rotation    Knee flexion    Knee extension    Ankle dorsiflexion    Ankle plantarflexion    Ankle inversion    Ankle eversion     (Blank rows = not tested)  LOWER EXTREMITY MMT:    MMT Right eval Left eval Right 12/13/22 Left 12/13/22  Hip flexion 4 4+ 4+ 4+  Hip extension 3- 3- 3- 3-  Hip abduction      Hip adduction      Hip internal rotation      Hip external rotation      Knee flexion 4+ 4+ 4+ 4-  Knee extension 4+ 5 5 5   Ankle dorsiflexion 4+ 5 5 5   Ankle plantarflexion      Ankle inversion      Ankle eversion       (Blank rows = not tested)  FUNCTIONAL TESTS:  5 times sit to stand: 12.87 sec pushing up on knees some  GAIT: Distance walked: 50 ft in clinic Assistive device utilized: None Level of assistance: Modified independence Comments: forward flexed trunk  TODAY'S TREATMENT:  DATE:  01/04/23 Prone manual lumbar distraction (anterior and superior push) 10" hold 2 x 10" Education on breathing techniques and self care 12/21/22 Standing: Hip extension x 10 each Heel raises x 10 Slant board 5 x 20" Prone: Prone manual lumbar distraction (anterior and superior push) 10" hold 2 x 10"  12/15/22 Standing: Hip extension x 10  Prone lying with pillow under hips x 5' with moist heat  Prone manual lumbar distraction (anterior and superior push) 10" hold 2 x 10"  12/13/22 Progress note FOTO 37 5 times  sit to stand 11.17 using hands to assist some MMT's and AROM see above Prone manual lumbar distraction (anterior and superior push) 10" hold 2 x 10"  12/07/22 Prone lying over 1 pillow with moist heat x 10' Prone manual lumbar distraction (anterior and superior push) 10" hold 2 x 10" Review of HEP  12/05/22 Prone lying over 1 pillow with moist heat x 10' Prone manual lumbar distraction (anterior and superior push) 10" hold 2 x 10"  12/01/22 Trial of lumbar traction today; 15 minutes; 100 lb max intermittent; 4 steps Prone manual lumbar distraction (anterior and superior push) 10" hold 2 x 10"  11/25/22 Review of HEP and goals Prone manual lumbar distraction (anterior and superior push) 10" hold 2 x 10" Discussion of lumbar traction trial   11/08/22 physical therapy evaluation and HEP instruction    PATIENT EDUCATION:  Education details: Patient educated on exam findings, POC, scope of PT, HEP, and what to expect next visit. Person educated: Patient Education method: Explanation, Demonstration, and Handouts Education comprehension: verbalized understanding, returned demonstration, verbal cues required, and tactile cues required  HOME EXERCISE PROGRAM: Access Code: [email or web address] URL: [email or web address] Date: 11/08/2022 Prepared by: [facility]  Exercises - Supine Lower Trunk Rotation  - 2 x daily - 7 x weekly - 1 sets - 10 reps - Supine Transversus Abdominis Bracing - Hands on Stomach  - 2 x daily - 7 x weekly - 1 sets - 10 reps - Supine Bridge  - 1 x daily - 7 x weekly - 3 sets - 10 reps - Supine March  - 1 x daily - 7 x weekly - 3 sets - 10 reps  12/15/22 standing hip extension at counter  12/21/22 - Heel Raises with Counter Support  - 1 x daily - 7 x weekly - 1 sets - 10 reps - Standing Gastroc Stretch on Step  - 1 x daily - 7 x weekly - 1 sets - 10 reps - 20" hold   ASSESSMENT:  CLINICAL IMPRESSION: Patient with noted general weakness today due to  recent pneumonia so held off of exercising due to patient general malaise and weakness but did do his manual mobilizations to decrease hip pain.  Discussed with patient wearing a mask when working outside and to make sure to stay hydrated; also about active recovery making sure he is not just lying around while recovering from pneumonia.   Patient will benefit from continued skilled therapy services  to address deficits and promote return to optimal function and to meet partially met and unmet goal.         Eval:Patient is a 72 y.o. male who was seen today for physical therapy evaluation and treatment for M48.062 (ICD-10-CM) - Spinal stenosis, lumbar region with neurogenic claudication.  Patient demonstrates muscle weakness, reduced ROM, and fascial restrictions which are likely contributing to symptoms of pain and are negatively impacting patient ability to perform ADLs and functional mobility  tasks. Patient will benefit from skilled physical therapy services to address these deficits to reduce pain and improve level of function with ADLs and functional mobility tasks.   OBJECTIVE IMPAIRMENTS: Abnormal gait, decreased activity tolerance, difficulty walking, decreased ROM, decreased strength, impaired perceived functional ability, and pain.   ACTIVITY LIMITATIONS: carrying, lifting, bending, sitting, standing, squatting, sleeping, stairs, and locomotion level  PARTICIPATION LIMITATIONS: meal prep, cleaning, laundry, community activity, and yard work  [facility] POTENTIAL: Good  CLINICAL DECISION MAKING: Stable/uncomplicated  EVALUATION COMPLEXITY: Low   GOALS: Goals reviewed with patient? No  SHORT TERM GOALS: Target date: 11/22/2022  patient will be independent with initial HEP  Baseline: Goal status: in progress   2.  Patient will self report 50% improvement to improve tolerance for functional activity  Baseline: "60%" 12/13/22 Goal status: met  LONG TERM GOALS: Target date:  12/09/2022  Patient will be independent in self management strategies to improve quality of life and functional outcomes.   Baseline:  Goal status: IN progress  2.  Patient will self report 75% improvement to improve tolerance for functional activity  Baseline:  Goal status: IN progress  3.  Patient will improve 5 times sit to stand score from 12.87 sec to 11 sec to demonstrate improved functional mobility and increased lower extremity strength.   Baseline: 12/13/22 11.17 sec Goal status: IN progress  4.  Patient will increase  leg MMTs to 4+ to 5/5 without pain to promote return to ambulation community distances with minimal deviation.  Baseline: see above Goal status: IN progress  5.  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 Baseline:  Goal status: IN progress   PLAN:  PT FREQUENCY: 2x/week  PT DURATION: 4 weeks  PLANNED INTERVENTIONS: Therapeutic exercises, Therapeutic activity, Neuromuscular re-education, Balance training, Gait training, Patient/Family education, Joint manipulation, Joint mobilization, Stair training, Orthotic/Fit training, DME instructions, Aquatic Therapy, Dry Needling, Electrical stimulation, Spinal manipulation, Spinal mobilization, Cryotherapy, Moist heat, Compression bandaging, scar mobilization, Splintting, Taping, Traction, Ultrasound, Ionotophoresis 4mg /ml Dexamethasone, and Manual therapy .  PLAN FOR NEXT SESSION: continue with lumbar manual;  core strengthening and lumbar mobility; resume there ex next visit   12:14 PM, 01/04/23 Martel Galvan Small Obed Samek MPT [facility] [place] [phone] [phone]

## 2023-01-06 ENCOUNTER — Ambulatory Visit (HOSPITAL_COMMUNITY): Payer: Medicare HMO

## 2023-01-06 DIAGNOSIS — G8929 Other chronic pain: Secondary | ICD-10-CM | POA: Diagnosis not present

## 2023-01-06 DIAGNOSIS — Q7649 Other congenital malformations of spine, not associated with scoliosis: Secondary | ICD-10-CM

## 2023-01-06 DIAGNOSIS — M545 Low back pain, unspecified: Secondary | ICD-10-CM | POA: Diagnosis not present

## 2023-01-06 NOTE — Therapy (Signed)
OUTPATIENT PHYSICAL THERAPY THORACOLUMBAR TREATMENT       Patient Name: MACOY SIXTOS MRN: [id number] DOB:03-Jul-1950, 72 y.o., male Today's Date: 01/06/2023  END OF SESSION:  PT End of Session - 01/06/23 1154     Visit Number 10    Number of Visits 16    Date for PT Re-Evaluation 12/09/22    Authorization Type [employer]    Authorization Time Period 8 visits approved 10/22-11/22    Authorization - Visit Number 4    Authorization - Number of Visits 8    Progress Note Due on Visit 8    PT Start Time 1148    PT Stop Time 1215    PT Time Calculation (min) 27 min    Activity Tolerance Patient tolerated treatment well    Behavior During Therapy WFL for tasks assessed/performed             Past Medical History:  Diagnosis Date   AAA (abdominal aortic aneurysm) (HCC)    Anxiety    Arthritis    COPD (chronic obstructive pulmonary disease) (HCC)    Degenerative disorder of bone    Depression    Diverticulitis    Dyspnea    due to COPD and when walking long distances per patient   GERD (gastroesophageal reflux disease)    History of kidney stones    Hypertension    Kidney stone    Spinal stenosis    Past Surgical History:  Procedure Laterality Date   ABDOMINAL AORTIC ANEURYSM REPAIR N/A 01/16/2019   Procedure: OPEN REPAIR OF ABDOMINAL AORTIC ANEURYSM;  Surgeon: Larina Earthly, MD;  Location: [facility] OR;  Service: Vascular;  Laterality: N/A;   BIOPSY  01/14/2017   Procedure: BIOPSY;  Surgeon: West Bali, MD;  Location: [facility];  Service: Endoscopy;;  gastric    CERVICAL SPINE SURGERY     C6-7 ACDF 09/22/09   COLONOSCOPY N/A 04/09/2014   Procedure: COLONOSCOPY;  Surgeon: Malissa Hippo, MD;  Location: [facility];  Service: Endoscopy;  Laterality: N/A;  730 - moved to 7:30 - Ann to notify pt   CYSTOSCOPY     with stent   ESOPHAGOGASTRODUODENOSCOPY N/A 01/14/2017   Procedure: ESOPHAGOGASTRODUODENOSCOPY (EGD);  Surgeon: West Bali, MD;  Location: [facility];  Service: Endoscopy;  Laterality: N/A;   kidney stent     Patient Active Problem List   Diagnosis Date Noted   Lumbar foraminal stenosis 03/03/2022   Sacralization of lumbar vertebra 03/03/2022   Degenerative lumbar spinal stenosis 03/03/2022   Degeneration of lumbar intervertebral disc 03/03/2022   Lumbar spondylosis 03/03/2022   Arthropathy of lumbar facet joint 03/03/2022   Low back pain 08/21/2019   AAA (abdominal aortic aneurysm) (HCC) 01/16/2019   UGIB (upper gastrointestinal bleed)    Acute blood loss anemia 01/13/2017   Melena 01/13/2017   Precordial chest pain 01/13/2017   Neck pain 08/19/2015   Displacement of cervical intervertebral disc without myelopathy 08/19/2015   Cervical radiculopathy 08/19/2015   Iliac artery aneurysm (HCC) 07/11/2014   Tobacco abuse 07/11/2014   History of colonic polyps 03/18/2014   Abdominal aortic aneurysm (HCC) 03/18/2014   Essential hypertension 03/18/2014    PCP: Carylon Perches, MD  REFERRING PROVIDER: Clovis Riley, PA-C  REFERRING DIAG: [phone] (ICD-10-CM) - Spinal stenosis, lumbar region with neurogenic claudication  Rationale for Evaluation and Treatment: Rehabilitation  THERAPY DIAG:  Chronic low back pain, unspecified back pain laterality, unspecified whether sciatica present  Bertolotti's syndrome  ONSET DATE: chronic ongoing back pain  SUBJECTIVE:                                                                                                                                                                                           SUBJECTIVE STATEMENT: Feeling better; recovering from pneumonia; right hip will "act up" if he walks too much; varies day to day; also gets some cramping in his left side occasionally PERTINENT HISTORY:  Right knee fracture last year  PAIN:  Are you having pain? Yes: NPRS scale: 1-10/10 Pain location: low back Pain description: low back and right hip Aggravating factors:  standing, walking Relieving factors: sitting, rest  PRECAUTIONS: Fall    WEIGHT BEARING RESTRICTIONS: No  FALLS:  Has patient fallen in last 6 months? Yes. Number of falls 1 tripped outside  OCCUPATION: retired and active on his farm  PLOF: Independent  PATIENT GOALS: less pain  NEXT MD VISIT: PRN  OBJECTIVE:   DIAGNOSTIC FINDINGS:  CLINICAL DATA:  Foraminal stenosis of lumbar region   EXAM: MRI LUMBAR SPINE WITHOUT CONTRAST   TECHNIQUE: Multiplanar, multisequence MR imaging of the lumbar spine was performed. No intravenous contrast was administered.   COMPARISON:  03/12/2021   FINDINGS: Segmentation: 5 lumbar type vertebral bodies. Pseudoarticulation of elongated L5 transverse processes with the sacral ala.   Alignment: Dextrocurvature. Trace retrolisthesis of L2 on L3 and L3 on L4, unchanged. Trace anterolisthesis of L4 on L5, unchanged.   Vertebrae:  L1   Conus medullaris and cauda equina: Conus extends to the No acute fracture, evidence of discitis, or suspicious osseous lesion. level. Conus and cauda equina appear normal.   Paraspinal and other soft tissues: The infrarenal abdominal aorta measures up to 3 cm (series 5, image 9). Aortic atherosclerosis. Multiple renal cysts, for which no follow-up is currently indicated. No lymphadenopathy.   Disc levels:   T12-L1: No significant disc bulge. No spinal canal stenosis or neural foraminal narrowing.   L1-L2: Minimal disc bulge. Mild facet arthropathy. No spinal canal stenosis or neural foraminal narrowing.   L2-L3: Trace retrolisthesis and mild disc bulge with superimposed right foraminal extrusion with 6 mm of cranial migration, new from the prior exam. Moderate facet arthropathy. Narrowing of the lateral recesses. Ligamentum flavum hypertrophy. Mild-to-moderate spinal canal stenosis, unchanged. Moderate right neural foraminal narrowing, progressed from the prior exam.   L3-L4: Trace retrolisthesis  with mild disc bulge and superimposed right subarticular disc extrusion with 7 mm of caudal migration, unchanged. This likely contacts the descending right L4 nerve roots. Moderate facet arthropathy. Ligamentum flavum hypertrophy. Narrowing of the lateral recesses. Mild spinal canal stenosis. Moderate  left neural foraminal narrowing, unchanged.   L4-L5: Trace anterolisthesis with disc unroofing and mild disc bulge. Severe facet arthropathy. No spinal canal stenosis or neural foraminal narrowing.   L5-S1: Mild disc bulge with superimposed right paracentral protrusion. Moderate facet arthropathy. No spinal canal stenosis. Mild right neural foraminal narrowing, unchanged.   IMPRESSION: 1. L2-L3 mild-to-moderate spinal canal stenosis, unchanged, and moderate right neural foraminal narrowing, which has progressed from the prior exam. Narrowing of the lateral recesses at this level could affect the descending L3 nerve roots. 2. L3-L4 mild spinal canal stenosis and moderate left neural foraminal narrowing, unchanged. Narrowing of the lateral recesses at this level could affect the descending L4 nerve roots. In addition, a disc extrusion at this level likely contacts the descending right L4 nerve roots. 3. L5-S1 mild right neural foraminal narrowing. 4. Multilevel facet arthropathy, which is severe at L4-L5, which can be a cause of back pain.     Electronically Signed   By: Wiliam Ke M.D.   On: 07/22/2022 02:13  PATIENT SURVEYS:  FOTO 45  COGNITION: Overall cognitive status: Within functional limits for tasks assessed     SENSATION: WFL  POSTURE: rounded shoulders, forward head, and flexed trunk   PALPATION: Tenderness L3-S1 area  LUMBAR ROM:   AROM eval 12/13/22  Flexion 65% available 75% available  Extension 15% available 25% available  Right lateral flexion    Left lateral flexion    Right rotation    Left rotation     (Blank rows = not tested)  LOWER  EXTREMITY ROM:     Active  Right eval Left eval  Hip flexion    Hip extension    Hip abduction    Hip adduction    Hip internal rotation    Hip external rotation    Knee flexion    Knee extension    Ankle dorsiflexion    Ankle plantarflexion    Ankle inversion    Ankle eversion     (Blank rows = not tested)  LOWER EXTREMITY MMT:    MMT Right eval Left eval Right 12/13/22 Left 12/13/22  Hip flexion 4 4+ 4+ 4+  Hip extension 3- 3- 3- 3-  Hip abduction      Hip adduction      Hip internal rotation      Hip external rotation      Knee flexion 4+ 4+ 4+ 4-  Knee extension 4+ 5 5 5   Ankle dorsiflexion 4+ 5 5 5   Ankle plantarflexion      Ankle inversion      Ankle eversion       (Blank rows = not tested)  FUNCTIONAL TESTS:  5 times sit to stand: 12.87 sec pushing up on knees some  GAIT: Distance walked: 50 ft in clinic Assistive device utilized: None Level of assistance: Modified independence Comments: forward flexed trunk  TODAY'S TREATMENT:  DATE:  01/06/2023 Seated: Thoracic extension x 10 Standing: Heel raises x 30 Hip exetension 2 x 10 Hip abduction 2 x 10 Prone: Prone manual lumbar distraction (anterior and superior push) 10" hold 2 x 10"  01/04/23 Prone manual lumbar distraction (anterior and superior push) 10" hold 2 x 10" Education on breathing techniques and self care 12/21/22 Standing: Hip extension x 10 each Heel raises x 10 Slant board 5 x 20" Prone: Prone manual lumbar distraction (anterior and superior push) 10" hold 2 x 10"  12/15/22 Standing: Hip extension x 10  Prone lying with pillow under hips x 5' with moist heat  Prone manual lumbar distraction (anterior and superior push) 10" hold 2 x 10"  12/13/22 Progress note FOTO 37 5 times sit to stand 11.17 using hands to assist some MMT's and AROM see  above Prone manual lumbar distraction (anterior and superior push) 10" hold 2 x 10"  12/07/22 Prone lying over 1 pillow with moist heat x 10' Prone manual lumbar distraction (anterior and superior push) 10" hold 2 x 10" Review of HEP  12/05/22 Prone lying over 1 pillow with moist heat x 10' Prone manual lumbar distraction (anterior and superior push) 10" hold 2 x 10"  12/01/22 Trial of lumbar traction today; 15 minutes; 100 lb max intermittent; 4 steps Prone manual lumbar distraction (anterior and superior push) 10" hold 2 x 10"  11/25/22 Review of HEP and goals Prone manual lumbar distraction (anterior and superior push) 10" hold 2 x 10" Discussion of lumbar traction trial   11/08/22 physical therapy evaluation and HEP instruction    PATIENT EDUCATION:  Education details: Patient educated on exam findings, POC, scope of PT, HEP, and what to expect next visit. Person educated: Patient Education method: Explanation, Demonstration, and Handouts Education comprehension: verbalized understanding, returned demonstration, verbal cues required, and tactile cues required  HOME EXERCISE PROGRAM: Access Code: [email or web address] URL: [email or web address] Date: 11/08/2022 Prepared by: [facility]  Exercises - Supine Lower Trunk Rotation  - 2 x daily - 7 x weekly - 1 sets - 10 reps - Supine Transversus Abdominis Bracing - Hands on Stomach  - 2 x daily - 7 x weekly - 1 sets - 10 reps - Supine Bridge  - 1 x daily - 7 x weekly - 3 sets - 10 reps - Supine March  - 1 x daily - 7 x weekly - 3 sets - 10 reps  12/15/22 standing hip extension at counter  12/21/22 - Heel Raises with Counter Support  - 1 x daily - 7 x weekly - 1 sets - 10 reps - Standing Gastroc Stretch on Step  - 1 x daily - 7 x weekly - 1 sets - 10 reps - 20" hold   ASSESSMENT:  CLINICAL IMPRESSION: Resumed there ex today.  Patient continues with flexed trunk posture so added thoracic extension today.  Patient  continues with occasional spasm in left side trunk but states this happens periodically.     Patient will benefit from continued skilled therapy services  to address deficits and promote return to optimal function and to meet partially met and unmet goal.         Eval:Patient is a 72 y.o. male who was seen today for physical therapy evaluation and treatment for M48.062 (ICD-10-CM) - Spinal stenosis, lumbar region with neurogenic claudication.  Patient demonstrates muscle weakness, reduced ROM, and fascial restrictions which are likely contributing to symptoms of pain and are negatively impacting patient ability to perform  ADLs and functional mobility tasks. Patient will benefit from skilled physical therapy services to address these deficits to reduce pain and improve level of function with ADLs and functional mobility tasks.   OBJECTIVE IMPAIRMENTS: Abnormal gait, decreased activity tolerance, difficulty walking, decreased ROM, decreased strength, impaired perceived functional ability, and pain.   ACTIVITY LIMITATIONS: carrying, lifting, bending, sitting, standing, squatting, sleeping, stairs, and locomotion level  PARTICIPATION LIMITATIONS: meal prep, cleaning, laundry, community activity, and yard work  [facility] POTENTIAL: Good  CLINICAL DECISION MAKING: Stable/uncomplicated  EVALUATION COMPLEXITY: Low   GOALS: Goals reviewed with patient? No  SHORT TERM GOALS: Target date: 11/22/2022  patient will be independent with initial HEP  Baseline: Goal status: in progress   2.  Patient will self report 50% improvement to improve tolerance for functional activity  Baseline: "60%" 12/13/22 Goal status: met  LONG TERM GOALS: Target date: 12/09/2022  Patient will be independent in self management strategies to improve quality of life and functional outcomes.   Baseline:  Goal status: IN progress  2.  Patient will self report 75% improvement to improve tolerance for functional  activity  Baseline:  Goal status: IN progress  3.  Patient will improve 5 times sit to stand score from 12.87 sec to 11 sec to demonstrate improved functional mobility and increased lower extremity strength.   Baseline: 12/13/22 11.17 sec Goal status: IN progress  4.  Patient will increase  leg MMTs to 4+ to 5/5 without pain to promote return to ambulation community distances with minimal deviation.  Baseline: see above Goal status: IN progress  5.  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 Baseline:  Goal status: IN progress   PLAN:  PT FREQUENCY: 2x/week  PT DURATION: 4 weeks  PLANNED INTERVENTIONS: Therapeutic exercises, Therapeutic activity, Neuromuscular re-education, Balance training, Gait training, Patient/Family education, Joint manipulation, Joint mobilization, Stair training, Orthotic/Fit training, DME instructions, Aquatic Therapy, Dry Needling, Electrical stimulation, Spinal manipulation, Spinal mobilization, Cryotherapy, Moist heat, Compression bandaging, scar mobilization, Splintting, Taping, Traction, Ultrasound, Ionotophoresis 4mg /ml Dexamethasone, and Manual therapy .  PLAN FOR NEXT SESSION: continue with lumbar manual;  core strengthening and lumbar mobility 12:22 PM, 01/06/23 Francely Craw Small Goro Wenrick MPT [facility] [place] [phone]

## 2023-01-09 ENCOUNTER — Ambulatory Visit (HOSPITAL_COMMUNITY): Payer: Medicare HMO

## 2023-01-09 DIAGNOSIS — M545 Low back pain, unspecified: Secondary | ICD-10-CM | POA: Diagnosis not present

## 2023-01-09 DIAGNOSIS — Q7649 Other congenital malformations of spine, not associated with scoliosis: Secondary | ICD-10-CM | POA: Diagnosis not present

## 2023-01-09 DIAGNOSIS — G8929 Other chronic pain: Secondary | ICD-10-CM | POA: Diagnosis not present

## 2023-01-09 NOTE — Therapy (Signed)
OUTPATIENT PHYSICAL THERAPY THORACOLUMBAR TREATMENT       Patient Name: VAUN SHELDON MRN: [id number] DOB:09/09/1950, 72 y.o., male Today's Date: 01/09/2023  END OF SESSION:  PT End of Session - 01/09/23 1156     Visit Number 11    Number of Visits 16    Date for PT Re-Evaluation 01/13/23    Authorization Type [employer]    Authorization Time Period 8 visits approved 10/22-11/22    Authorization - Visit Number 5    Authorization - Number of Visits 8    Progress Note Due on Visit 8    PT Start Time 1152    PT Stop Time 1230    PT Time Calculation (min) 38 min    Activity Tolerance Patient tolerated treatment well    Behavior During Therapy WFL for tasks assessed/performed             Past Medical History:  Diagnosis Date   AAA (abdominal aortic aneurysm) (HCC)    Anxiety    Arthritis    COPD (chronic obstructive pulmonary disease) (HCC)    Degenerative disorder of bone    Depression    Diverticulitis    Dyspnea    due to COPD and when walking long distances per patient   GERD (gastroesophageal reflux disease)    History of kidney stones    Hypertension    Kidney stone    Spinal stenosis    Past Surgical History:  Procedure Laterality Date   ABDOMINAL AORTIC ANEURYSM REPAIR N/A 01/16/2019   Procedure: OPEN REPAIR OF ABDOMINAL AORTIC ANEURYSM;  Surgeon: Larina Earthly, MD;  Location: [facility] OR;  Service: Vascular;  Laterality: N/A;   BIOPSY  01/14/2017   Procedure: BIOPSY;  Surgeon: West Bali, MD;  Location: [facility];  Service: Endoscopy;;  gastric    CERVICAL SPINE SURGERY     C6-7 ACDF 09/22/09   COLONOSCOPY N/A 04/09/2014   Procedure: COLONOSCOPY;  Surgeon: Malissa Hippo, MD;  Location: [facility];  Service: Endoscopy;  Laterality: N/A;  730 - moved to 7:30 - Ann to notify pt   CYSTOSCOPY     with stent   ESOPHAGOGASTRODUODENOSCOPY N/A 01/14/2017   Procedure: ESOPHAGOGASTRODUODENOSCOPY (EGD);  Surgeon: West Bali, MD;  Location: [facility];  Service: Endoscopy;  Laterality: N/A;   kidney stent     Patient Active Problem List   Diagnosis Date Noted   Lumbar foraminal stenosis 03/03/2022   Sacralization of lumbar vertebra 03/03/2022   Degenerative lumbar spinal stenosis 03/03/2022   Degeneration of lumbar intervertebral disc 03/03/2022   Lumbar spondylosis 03/03/2022   Arthropathy of lumbar facet joint 03/03/2022   Low back pain 08/21/2019   AAA (abdominal aortic aneurysm) (HCC) 01/16/2019   UGIB (upper gastrointestinal bleed)    Acute blood loss anemia 01/13/2017   Melena 01/13/2017   Precordial chest pain 01/13/2017   Neck pain 08/19/2015   Displacement of cervical intervertebral disc without myelopathy 08/19/2015   Cervical radiculopathy 08/19/2015   Iliac artery aneurysm (HCC) 07/11/2014   Tobacco abuse 07/11/2014   History of colonic polyps 03/18/2014   Abdominal aortic aneurysm (HCC) 03/18/2014   Essential hypertension 03/18/2014    PCP: Carylon Perches, MD  REFERRING PROVIDER: Clovis Riley, PA-C  REFERRING DIAG: [phone] (ICD-10-CM) - Spinal stenosis, lumbar region with neurogenic claudication  Rationale for Evaluation and Treatment: Rehabilitation  THERAPY DIAG:  Chronic low back pain, unspecified back pain laterality, unspecified whether sciatica present  Bertolotti's syndrome  ONSET DATE: chronic ongoing back pain  SUBJECTIVE:                                                                                                                                                                                           SUBJECTIVE STATEMENT: No pain; just stiff; was able to go hunting after last treatment; back hurt this morning when he first got up; had to walk real slow until it loosened up.  PERTINENT HISTORY:  Right knee fracture last year  PAIN:  Are you having pain? Yes: NPRS scale: 1-10/10 Pain location: low back Pain description: low back and right hip Aggravating factors: standing,  walking Relieving factors: sitting, rest  PRECAUTIONS: Fall    WEIGHT BEARING RESTRICTIONS: No  FALLS:  Has patient fallen in last 6 months? Yes. Number of falls 1 tripped outside  OCCUPATION: retired and active on his farm  PLOF: Independent  PATIENT GOALS: less pain  NEXT MD VISIT: PRN  OBJECTIVE:   DIAGNOSTIC FINDINGS:  CLINICAL DATA:  Foraminal stenosis of lumbar region   EXAM: MRI LUMBAR SPINE WITHOUT CONTRAST   TECHNIQUE: Multiplanar, multisequence MR imaging of the lumbar spine was performed. No intravenous contrast was administered.   COMPARISON:  03/12/2021   FINDINGS: Segmentation: 5 lumbar type vertebral bodies. Pseudoarticulation of elongated L5 transverse processes with the sacral ala.   Alignment: Dextrocurvature. Trace retrolisthesis of L2 on L3 and L3 on L4, unchanged. Trace anterolisthesis of L4 on L5, unchanged.   Vertebrae:  L1   Conus medullaris and cauda equina: Conus extends to the No acute fracture, evidence of discitis, or suspicious osseous lesion. level. Conus and cauda equina appear normal.   Paraspinal and other soft tissues: The infrarenal abdominal aorta measures up to 3 cm (series 5, image 9). Aortic atherosclerosis. Multiple renal cysts, for which no follow-up is currently indicated. No lymphadenopathy.   Disc levels:   T12-L1: No significant disc bulge. No spinal canal stenosis or neural foraminal narrowing.   L1-L2: Minimal disc bulge. Mild facet arthropathy. No spinal canal stenosis or neural foraminal narrowing.   L2-L3: Trace retrolisthesis and mild disc bulge with superimposed right foraminal extrusion with 6 mm of cranial migration, new from the prior exam. Moderate facet arthropathy. Narrowing of the lateral recesses. Ligamentum flavum hypertrophy. Mild-to-moderate spinal canal stenosis, unchanged. Moderate right neural foraminal narrowing, progressed from the prior exam.   L3-L4: Trace retrolisthesis with mild  disc bulge and superimposed right subarticular disc extrusion with 7 mm of caudal migration, unchanged. This likely contacts the descending right L4 nerve roots. Moderate facet arthropathy. Ligamentum flavum hypertrophy. Narrowing of the lateral recesses. Mild spinal  canal stenosis. Moderate left neural foraminal narrowing, unchanged.   L4-L5: Trace anterolisthesis with disc unroofing and mild disc bulge. Severe facet arthropathy. No spinal canal stenosis or neural foraminal narrowing.   L5-S1: Mild disc bulge with superimposed right paracentral protrusion. Moderate facet arthropathy. No spinal canal stenosis. Mild right neural foraminal narrowing, unchanged.   IMPRESSION: 1. L2-L3 mild-to-moderate spinal canal stenosis, unchanged, and moderate right neural foraminal narrowing, which has progressed from the prior exam. Narrowing of the lateral recesses at this level could affect the descending L3 nerve roots. 2. L3-L4 mild spinal canal stenosis and moderate left neural foraminal narrowing, unchanged. Narrowing of the lateral recesses at this level could affect the descending L4 nerve roots. In addition, a disc extrusion at this level likely contacts the descending right L4 nerve roots. 3. L5-S1 mild right neural foraminal narrowing. 4. Multilevel facet arthropathy, which is severe at L4-L5, which can be a cause of back pain.     Electronically Signed   By: Wiliam Ke M.D.   On: 07/22/2022 02:13  PATIENT SURVEYS:  FOTO 45  COGNITION: Overall cognitive status: Within functional limits for tasks assessed     SENSATION: WFL  POSTURE: rounded shoulders, forward head, and flexed trunk   PALPATION: Tenderness L3-S1 area  LUMBAR ROM:   AROM eval 12/13/22  Flexion 65% available 75% available  Extension 15% available 25% available  Right lateral flexion    Left lateral flexion    Right rotation    Left rotation     (Blank rows = not tested)  LOWER EXTREMITY ROM:      Active  Right eval Left eval  Hip flexion    Hip extension    Hip abduction    Hip adduction    Hip internal rotation    Hip external rotation    Knee flexion    Knee extension    Ankle dorsiflexion    Ankle plantarflexion    Ankle inversion    Ankle eversion     (Blank rows = not tested)  LOWER EXTREMITY MMT:    MMT Right eval Left eval Right 12/13/22 Left 12/13/22  Hip flexion 4 4+ 4+ 4+  Hip extension 3- 3- 3- 3-  Hip abduction      Hip adduction      Hip internal rotation      Hip external rotation      Knee flexion 4+ 4+ 4+ 4-  Knee extension 4+ 5 5 5   Ankle dorsiflexion 4+ 5 5 5   Ankle plantarflexion      Ankle inversion      Ankle eversion       (Blank rows = not tested)  FUNCTIONAL TESTS:  5 times sit to stand: 12.87 sec pushing up on knees some  GAIT: Distance walked: 50 ft in clinic Assistive device utilized: None Level of assistance: Modified independence Comments: forward flexed trunk  TODAY'S TREATMENT:  DATE:  01/09/23 Standing: Heel raises x 20 Hip abduction 2 x 10  Prone: Prone manual lumbar distraction (anterior and superior push) 10" hold 2 x 10  Seated: Thoracic extension x 10 Lumbar flexion x 8  01/06/2023 Seated: Thoracic extension x 10 Standing: Heel raises x 30 Hip exetension 2 x 10 Hip abduction 2 x 10 Prone: Prone manual lumbar distraction (anterior and superior push) 10" hold 2 x 10"  01/04/23 Prone manual lumbar distraction (anterior and superior push) 10" hold 2 x 10" Education on breathing techniques and self care 12/21/22 Standing: Hip extension x 10 each Heel raises x 10 Slant board 5 x 20" Prone: Prone manual lumbar distraction (anterior and superior push) 10" hold 2 x 10"  12/15/22 Standing: Hip extension x 10  Prone lying with pillow under hips x 5' with moist heat  Prone  manual lumbar distraction (anterior and superior push) 10" hold 2 x 10"  12/13/22 Progress note FOTO 37 5 times sit to stand 11.17 using hands to assist some MMT's and AROM see above Prone manual lumbar distraction (anterior and superior push) 10" hold 2 x 10"  12/07/22 Prone lying over 1 pillow with moist heat x 10' Prone manual lumbar distraction (anterior and superior push) 10" hold 2 x 10" Review of HEP  12/05/22 Prone lying over 1 pillow with moist heat x 10' Prone manual lumbar distraction (anterior and superior push) 10" hold 2 x 10"  12/01/22 Trial of lumbar traction today; 15 minutes; 100 lb max intermittent; 4 steps Prone manual lumbar distraction (anterior and superior push) 10" hold 2 x 10"  11/25/22 Review of HEP and goals Prone manual lumbar distraction (anterior and superior push) 10" hold 2 x 10" Discussion of lumbar traction trial   11/08/22 physical therapy evaluation and HEP instruction    PATIENT EDUCATION:  Education details: Patient educated on exam findings, POC, scope of PT, HEP, and what to expect next visit. Person educated: Patient Education method: Explanation, Demonstration, and Handouts Education comprehension: verbalized understanding, returned demonstration, verbal cues required, and tactile cues required  HOME EXERCISE PROGRAM: Access Code: [email or web address] URL: [email or web address] Date: 11/08/2022 Prepared by: [facility]  Exercises - Supine Lower Trunk Rotation  - 2 x daily - 7 x weekly - 1 sets - 10 reps - Supine Transversus Abdominis Bracing - Hands on Stomach  - 2 x daily - 7 x weekly - 1 sets - 10 reps - Supine Bridge  - 1 x daily - 7 x weekly - 3 sets - 10 reps - Supine March  - 1 x daily - 7 x weekly - 3 sets - 10 reps  12/15/22 standing hip extension at counter  12/21/22 - Heel Raises with Counter Support  - 1 x daily - 7 x weekly - 1 sets - 10 reps - Standing Gastroc Stretch on Step  - 1 x daily - 7 x weekly - 1 sets  - 10 reps - 20" hold   ASSESSMENT:  CLINICAL IMPRESSION: Patient with some increased low back pain with standing exercise today.  Trial of seated flexion increases his low back pain. Continued with manual mobilizations that patient reports decrease his pain.  "Loosens my back up";    Patient will benefit from continued skilled therapy services  to address deficits and promote return to optimal function and to meet partially met and unmet goal.         Eval:Patient is a 72 y.o. male who was seen today for physical  therapy evaluation and treatment for M48.062 (ICD-10-CM) - Spinal stenosis, lumbar region with neurogenic claudication.  Patient demonstrates muscle weakness, reduced ROM, and fascial restrictions which are likely contributing to symptoms of pain and are negatively impacting patient ability to perform ADLs and functional mobility tasks. Patient will benefit from skilled physical therapy services to address these deficits to reduce pain and improve level of function with ADLs and functional mobility tasks.   OBJECTIVE IMPAIRMENTS: Abnormal gait, decreased activity tolerance, difficulty walking, decreased ROM, decreased strength, impaired perceived functional ability, and pain.   ACTIVITY LIMITATIONS: carrying, lifting, bending, sitting, standing, squatting, sleeping, stairs, and locomotion level  PARTICIPATION LIMITATIONS: meal prep, cleaning, laundry, community activity, and yard work  [facility] POTENTIAL: Good  CLINICAL DECISION MAKING: Stable/uncomplicated  EVALUATION COMPLEXITY: Low   GOALS: Goals reviewed with patient? No  SHORT TERM GOALS: Target date: 11/22/2022  patient will be independent with initial HEP  Baseline: Goal status: in progress   2.  Patient will self report 50% improvement to improve tolerance for functional activity  Baseline: "60%" 12/13/22 Goal status: met  LONG TERM GOALS: Target date: 12/09/2022  Patient will be independent in self management  strategies to improve quality of life and functional outcomes.   Baseline:  Goal status: IN progress  2.  Patient will self report 75% improvement to improve tolerance for functional activity  Baseline:  Goal status: IN progress  3.  Patient will improve 5 times sit to stand score from 12.87 sec to 11 sec to demonstrate improved functional mobility and increased lower extremity strength.   Baseline: 12/13/22 11.17 sec Goal status: IN progress  4.  Patient will increase  leg MMTs to 4+ to 5/5 without pain to promote return to ambulation community distances with minimal deviation.  Baseline: see above Goal status: IN progress  5.  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 Baseline:  Goal status: IN progress   PLAN:  PT FREQUENCY: 2x/week  PT DURATION: 4 weeks  PLANNED INTERVENTIONS: Therapeutic exercises, Therapeutic activity, Neuromuscular re-education, Balance training, Gait training, Patient/Family education, Joint manipulation, Joint mobilization, Stair training, Orthotic/Fit training, DME instructions, Aquatic Therapy, Dry Needling, Electrical stimulation, Spinal manipulation, Spinal mobilization, Cryotherapy, Moist heat, Compression bandaging, scar mobilization, Splintting, Taping, Traction, Ultrasound, Ionotophoresis 4mg /ml Dexamethasone, and Manual therapy .  PLAN FOR NEXT SESSION: continue with lumbar manual;  core strengthening and lumbar mobility 12:25 PM, 01/09/23 Krystalle Pilkington Small Kynzli Rease MPT [facility] [place] [phone]

## 2023-01-12 ENCOUNTER — Encounter (HOSPITAL_COMMUNITY): Payer: Medicare HMO

## 2023-01-25 ENCOUNTER — Ambulatory Visit (HOSPITAL_COMMUNITY): Payer: Medicare HMO | Attending: Surgery

## 2023-01-25 DIAGNOSIS — G8929 Other chronic pain: Secondary | ICD-10-CM | POA: Diagnosis not present

## 2023-01-25 DIAGNOSIS — Q7649 Other congenital malformations of spine, not associated with scoliosis: Secondary | ICD-10-CM | POA: Insufficient documentation

## 2023-01-25 DIAGNOSIS — M545 Low back pain, unspecified: Secondary | ICD-10-CM | POA: Diagnosis not present

## 2023-01-25 NOTE — Therapy (Addendum)
OUTPATIENT PHYSICAL THERAPY THORACOLUMBAR TREATMENT /progress note/DISCHARGE NOTE  Progress Note Reporting Period 11/08/2022 to 01/25/2023  See note below for Objective Data and Assessment of Progress/Goals.   PHYSICAL THERAPY DISCHARGE SUMMARY  Visits from Start of Care: 12  Current functional level related to goals / functional outcomes: See below   Remaining deficits: See below   Education / Equipment: HEP   Patient agrees to discharge. Patient goals were partially met. Patient is being discharged due to maximized rehab potential.           Patient Name: Jeremy Mclean MRN: [id number] DOB:March 11, 1950, 72 y.o., male Today's Date: 01/25/2023  END OF SESSION:  PT End of Session - 01/25/23 0928     Visit Number 12    Number of Visits 16    Date for PT Re-Evaluation 01/13/23    Authorization Type [employer]    Authorization Time Period 8 visits approved 10/22-11/22    Authorization - Visit Number 6    Authorization - Number of Visits 8    Progress Note Due on Visit 8    PT Start Time 0928    PT Stop Time 1011    PT Time Calculation (min) 43 min    Activity Tolerance Patient tolerated treatment well    Behavior During Therapy WFL for tasks assessed/performed             Past Medical History:  Diagnosis Date   AAA (abdominal aortic aneurysm) (HCC)    Anxiety    Arthritis    COPD (chronic obstructive pulmonary disease) (HCC)    Degenerative disorder of bone    Depression    Diverticulitis    Dyspnea    due to COPD and when walking long distances per patient   GERD (gastroesophageal reflux disease)    History of kidney stones    Hypertension    Kidney stone    Spinal stenosis    Past Surgical History:  Procedure Laterality Date   ABDOMINAL AORTIC ANEURYSM REPAIR N/A 01/16/2019   Procedure: OPEN REPAIR OF ABDOMINAL AORTIC ANEURYSM;  Surgeon: Larina Earthly, MD;  Location: [facility];  Service: Vascular;  Laterality: N/A;   BIOPSY  01/14/2017   Procedure:  BIOPSY;  Surgeon: West Bali, MD;  Location: [facility];  Service: Endoscopy;;  gastric    CERVICAL SPINE SURGERY     C6-7 ACDF 09/22/09   COLONOSCOPY N/A 04/09/2014   Procedure: COLONOSCOPY;  Surgeon: Malissa Hippo, MD;  Location: [facility];  Service: Endoscopy;  Laterality: N/A;  730 - moved to 7:30 - Ann to notify pt   CYSTOSCOPY     with stent   ESOPHAGOGASTRODUODENOSCOPY N/A 01/14/2017   Procedure: ESOPHAGOGASTRODUODENOSCOPY (EGD);  Surgeon: West Bali, MD;  Location: [facility];  Service: Endoscopy;  Laterality: N/A;   kidney stent     Patient Active Problem List   Diagnosis Date Noted   Lumbar foraminal stenosis 03/03/2022   Sacralization of lumbar vertebra 03/03/2022   Degenerative lumbar spinal stenosis 03/03/2022   Degeneration of lumbar intervertebral disc 03/03/2022   Lumbar spondylosis 03/03/2022   Arthropathy of lumbar facet joint 03/03/2022   Low back pain 08/21/2019   AAA (abdominal aortic aneurysm) (HCC) 01/16/2019   UGIB (upper gastrointestinal bleed)    Acute blood loss anemia 01/13/2017   Melena 01/13/2017   Precordial chest pain 01/13/2017   Neck pain 08/19/2015   Displacement of cervical intervertebral disc without myelopathy 08/19/2015   Cervical radiculopathy 08/19/2015  Iliac artery aneurysm (HCC) 07/11/2014   Tobacco abuse 07/11/2014   History of colonic polyps 03/18/2014   Abdominal aortic aneurysm (HCC) 03/18/2014   Essential hypertension 03/18/2014    PCP: Carylon Perches, MD  REFERRING PROVIDER: Clovis Riley, PA-C  REFERRING DIAG: [phone] (ICD-10-CM) - Spinal stenosis, lumbar region with neurogenic claudication  Rationale for Evaluation and Treatment: Rehabilitation  THERAPY DIAG:  Chronic low back pain, unspecified back pain laterality, unspecified whether sciatica present  Bertolotti's syndrome  ONSET DATE: chronic ongoing back pain  SUBJECTIVE:                                                                                                                                                                                            SUBJECTIVE STATEMENT: "The center of my back is hurting constantly";   4/10 this morning; pain in the right hip when walking; maybe a little better but overall still having some issues.    PERTINENT HISTORY:  Right knee fracture last year  PAIN:  Are you having pain? Yes: NPRS scale: 1-10/10 Pain location: low back Pain description: low back and right hip Aggravating factors: standing, walking Relieving factors: sitting, rest  PRECAUTIONS: Fall    WEIGHT BEARING RESTRICTIONS: No  FALLS:  Has patient fallen in last 6 months? Yes. Number of falls 1 tripped outside  OCCUPATION: retired and active on his farm  PLOF: Independent  PATIENT GOALS: less pain  NEXT MD VISIT: PRN  OBJECTIVE:   DIAGNOSTIC FINDINGS:  CLINICAL DATA:  Foraminal stenosis of lumbar region   EXAM: MRI LUMBAR SPINE WITHOUT CONTRAST   TECHNIQUE: Multiplanar, multisequence MR imaging of the lumbar spine was performed. No intravenous contrast was administered.   COMPARISON:  03/12/2021   FINDINGS: Segmentation: 5 lumbar type vertebral bodies. Pseudoarticulation of elongated L5 transverse processes with the sacral ala.   Alignment: Dextrocurvature. Trace retrolisthesis of L2 on L3 and L3 on L4, unchanged. Trace anterolisthesis of L4 on L5, unchanged.   Vertebrae:  L1   Conus medullaris and cauda equina: Conus extends to the No acute fracture, evidence of discitis, or suspicious osseous lesion. level. Conus and cauda equina appear normal.   Paraspinal and other soft tissues: The infrarenal abdominal aorta measures up to 3 cm (series 5, image 9). Aortic atherosclerosis. Multiple renal cysts, for which no follow-up is currently indicated. No lymphadenopathy.   Disc levels:   T12-L1: No significant disc bulge. No spinal canal stenosis or neural foraminal narrowing.   L1-L2:  Minimal disc bulge. Mild facet arthropathy. No spinal canal stenosis or neural foraminal narrowing.   L2-L3: Trace retrolisthesis and mild disc bulge with  superimposed right foraminal extrusion with 6 mm of cranial migration, new from the prior exam. Moderate facet arthropathy. Narrowing of the lateral recesses. Ligamentum flavum hypertrophy. Mild-to-moderate spinal canal stenosis, unchanged. Moderate right neural foraminal narrowing, progressed from the prior exam.   L3-L4: Trace retrolisthesis with mild disc bulge and superimposed right subarticular disc extrusion with 7 mm of caudal migration, unchanged. This likely contacts the descending right L4 nerve roots. Moderate facet arthropathy. Ligamentum flavum hypertrophy. Narrowing of the lateral recesses. Mild spinal canal stenosis. Moderate left neural foraminal narrowing, unchanged.   L4-L5: Trace anterolisthesis with disc unroofing and mild disc bulge. Severe facet arthropathy. No spinal canal stenosis or neural foraminal narrowing.   L5-S1: Mild disc bulge with superimposed right paracentral protrusion. Moderate facet arthropathy. No spinal canal stenosis. Mild right neural foraminal narrowing, unchanged.   IMPRESSION: 1. L2-L3 mild-to-moderate spinal canal stenosis, unchanged, and moderate right neural foraminal narrowing, which has progressed from the prior exam. Narrowing of the lateral recesses at this level could affect the descending L3 nerve roots. 2. L3-L4 mild spinal canal stenosis and moderate left neural foraminal narrowing, unchanged. Narrowing of the lateral recesses at this level could affect the descending L4 nerve roots. In addition, a disc extrusion at this level likely contacts the descending right L4 nerve roots. 3. L5-S1 mild right neural foraminal narrowing. 4. Multilevel facet arthropathy, which is severe at L4-L5, which can be a cause of back pain.     Electronically Signed   By: Wiliam Ke  M.D.   On: 07/22/2022 02:13  PATIENT SURVEYS:  FOTO 45  COGNITION: Overall cognitive status: Within functional limits for tasks assessed     SENSATION: WFL  POSTURE: rounded shoulders, forward head, and flexed trunk   PALPATION: Tenderness L3-S1 area  LUMBAR ROM:   AROM eval 12/13/22 01/25/23  Flexion 65% available 75% available 80% available  Extension 15% available 25% available 25% available  Right lateral flexion     Left lateral flexion     Right rotation     Left rotation      (Blank rows = not tested)  LOWER EXTREMITY ROM:     Active  Right eval Left eval  Hip flexion    Hip extension    Hip abduction    Hip adduction    Hip internal rotation    Hip external rotation    Knee flexion    Knee extension    Ankle dorsiflexion    Ankle plantarflexion    Ankle inversion    Ankle eversion     (Blank rows = not tested)  LOWER EXTREMITY MMT:    MMT Right eval Left eval Right 12/13/22 Left 12/13/22 Right 01/25/2023 Left 01/25/2023  Hip flexion 4 4+ 4+ 4+ 4+ 4+  Hip extension 3- 3- 3- 3- 3+ 3  Hip abduction        Hip adduction        Hip internal rotation        Hip external rotation        Knee flexion 4+ 4+ 4+ 4- 4+ 4+  Knee extension 4+ 5 5 5 5 5   Ankle dorsiflexion 4+ 5 5 5 5 5   Ankle plantarflexion        Ankle inversion        Ankle eversion         (Blank rows = not tested)  FUNCTIONAL TESTS:  5 times sit to stand: 12.87 sec pushing up on knees some  GAIT: Distance  walked: 50 ft in clinic Assistive device utilized: None Level of assistance: Modified independence Comments: forward flexed trunk  TODAY'S TREATMENT:                                                                                                                              DATE:  01/25/23 Progress note 5 times sit to stand 13.18 sec using hands to assist MMT's and ROM see above  FOTO 53  Prone: Prone manual lumbar distraction (anterior and superior push) 10" hold 2 x  10  01/09/23 Standing: Heel raises x 20 Hip abduction 2 x 10  Prone: Prone manual lumbar distraction (anterior and superior push) 10" hold 2 x 10  Seated: Thoracic extension x 10 Lumbar flexion x 8  01/06/2023 Seated: Thoracic extension x 10 Standing: Heel raises x 30 Hip exetension 2 x 10 Hip abduction 2 x 10 Prone: Prone manual lumbar distraction (anterior and superior push) 10" hold 2 x 10"  01/04/23 Prone manual lumbar distraction (anterior and superior push) 10" hold 2 x 10" Education on breathing techniques and self care 12/21/22 Standing: Hip extension x 10 each Heel raises x 10 Slant board 5 x 20" Prone: Prone manual lumbar distraction (anterior and superior push) 10" hold 2 x 10"  12/15/22 Standing: Hip extension x 10  Prone lying with pillow under hips x 5' with moist heat  Prone manual lumbar distraction (anterior and superior push) 10" hold 2 x 10"  12/13/22 Progress note FOTO 37 5 times sit to stand 11.17 using hands to assist some MMT's and AROM see above Prone manual lumbar distraction (anterior and superior push) 10" hold 2 x 10"  12/07/22 Prone lying over 1 pillow with moist heat x 10' Prone manual lumbar distraction (anterior and superior push) 10" hold 2 x 10" Review of HEP  12/05/22 Prone lying over 1 pillow with moist heat x 10' Prone manual lumbar distraction (anterior and superior push) 10" hold 2 x 10"  12/01/22 Trial of lumbar traction today; 15 minutes; 100 lb max intermittent; 4 steps Prone manual lumbar distraction (anterior and superior push) 10" hold 2 x 10"  11/25/22 Review of HEP and goals Prone manual lumbar distraction (anterior and superior push) 10" hold 2 x 10" Discussion of lumbar traction trial   11/08/22 physical therapy evaluation and HEP instruction    PATIENT EDUCATION:  Education details: Patient educated on exam findings, POC, scope of PT, HEP, and what to expect next visit. Person educated:  Patient Education method: Explanation, Demonstration, and Handouts Education comprehension: verbalized understanding, returned demonstration, verbal cues required, and tactile cues required  HOME EXERCISE PROGRAM: Access Code: [email or web address] URL: [email or web address] Date: 11/08/2022 Prepared by: [facility]  Exercises - Supine Lower Trunk Rotation  - 2 x daily - 7 x weekly - 1 sets - 10 reps - Supine Transversus Abdominis Bracing - Hands on Stomach  - 2 x daily - 7 x weekly - 1 sets -  10 reps - Supine Bridge  - 1 x daily - 7 x weekly - 3 sets - 10 reps - Supine March  - 1 x daily - 7 x weekly - 3 sets - 10 reps  12/15/22 standing hip extension at counter  12/21/22 - Heel Raises with Counter Support  - 1 x daily - 7 x weekly - 1 sets - 10 reps - Standing Gastroc Stretch on Step  - 1 x daily - 7 x weekly - 1 sets - 10 reps - 20" hold   ASSESSMENT:  CLINICAL IMPRESSION: Progress note today; patient with mild improvement with strength and mobilit but decline in functional testing demonstrating progress plateau. Good improvement with FOTO.   Patient agreeable to discharge at this time due to progress plateau.     Eval:Patient is a 72 y.o. male who was seen today for physical therapy evaluation and treatment for M48.062 (ICD-10-CM) - Spinal stenosis, lumbar region with neurogenic claudication.  Patient demonstrates muscle weakness, reduced ROM, and fascial restrictions which are likely contributing to symptoms of pain and are negatively impacting patient ability to perform ADLs and functional mobility tasks. Patient will benefit from skilled physical therapy services to address these deficits to reduce pain and improve level of function with ADLs and functional mobility tasks.   OBJECTIVE IMPAIRMENTS: Abnormal gait, decreased activity tolerance, difficulty walking, decreased ROM, decreased strength, impaired perceived functional ability, and pain.   ACTIVITY LIMITATIONS:  carrying, lifting, bending, sitting, standing, squatting, sleeping, stairs, and locomotion level  PARTICIPATION LIMITATIONS: meal prep, cleaning, laundry, community activity, and yard work  [facility] POTENTIAL: Good  CLINICAL DECISION MAKING: Stable/uncomplicated  EVALUATION COMPLEXITY: Low   GOALS: Goals reviewed with patient? No  SHORT TERM GOALS: Target date: 11/22/2022  patient will be independent with initial HEP  Baseline: Goal status: met  2.  Patient will self report 50% improvement to improve tolerance for functional activity  Baseline: "60%" 12/13/22 Goal status: met  LONG TERM GOALS: Target date: 12/09/2022  Patient will be independent in self management strategies to improve quality of life and functional outcomes.   Baseline:  Goal status: IN progress  2.  Patient will self report 75% improvement to improve tolerance for functional activity  Baseline:  Goal status: IN progress  3.  Patient will improve 5 times sit to stand score from 12.87 sec to 11 sec to demonstrate improved functional mobility and increased lower extremity strength.   Baseline: 12/13/22 11.17 sec Goal status: IN progress  4.  Patient will increase  leg MMTs to 4+ to 5/5 without pain to promote return to ambulation community distances with minimal deviation.  Baseline: see above Goal status: IN progress  5.  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 Baseline:  Goal status: IN progress   PLAN:  PT FREQUENCY: 2x/week  PT DURATION: 4 weeks  PLANNED INTERVENTIONS: Therapeutic exercises, Therapeutic activity, Neuromuscular re-education, Balance training, Gait training, Patient/Family education, Joint manipulation, Joint mobilization, Stair training, Orthotic/Fit training, DME instructions, Aquatic Therapy, Dry Needling, Electrical stimulation, Spinal manipulation, Spinal mobilization, Cryotherapy, Moist heat, Compression  bandaging, scar mobilization, Splintting, Taping, Traction, Ultrasound, Ionotophoresis 4mg /ml Dexamethasone, and Manual therapy .  PLAN FOR NEXT SESSION:  discharge  10:16 AM, 01/25/23 [place] [facility] Yanissa Michalsky MPT [facility] [phone]

## 2023-01-30 DIAGNOSIS — M4726 Other spondylosis with radiculopathy, lumbar region: Secondary | ICD-10-CM | POA: Diagnosis not present

## 2023-01-30 DIAGNOSIS — M47816 Spondylosis without myelopathy or radiculopathy, lumbar region: Secondary | ICD-10-CM | POA: Diagnosis not present

## 2023-02-16 DIAGNOSIS — M47816 Spondylosis without myelopathy or radiculopathy, lumbar region: Secondary | ICD-10-CM | POA: Diagnosis not present

## 2023-03-14 DIAGNOSIS — M47816 Spondylosis without myelopathy or radiculopathy, lumbar region: Secondary | ICD-10-CM | POA: Diagnosis not present

## 2023-03-29 DIAGNOSIS — M47816 Spondylosis without myelopathy or radiculopathy, lumbar region: Secondary | ICD-10-CM | POA: Diagnosis not present

## 2023-05-05 DIAGNOSIS — M4726 Other spondylosis with radiculopathy, lumbar region: Secondary | ICD-10-CM | POA: Diagnosis not present

## 2023-05-05 DIAGNOSIS — M47816 Spondylosis without myelopathy or radiculopathy, lumbar region: Secondary | ICD-10-CM | POA: Diagnosis not present

## 2023-05-23 DIAGNOSIS — J440 Chronic obstructive pulmonary disease with acute lower respiratory infection: Secondary | ICD-10-CM | POA: Diagnosis not present

## 2023-05-23 DIAGNOSIS — I10 Essential (primary) hypertension: Secondary | ICD-10-CM | POA: Diagnosis not present

## 2023-05-23 DIAGNOSIS — M545 Low back pain, unspecified: Secondary | ICD-10-CM | POA: Diagnosis not present

## 2023-06-23 DIAGNOSIS — R29818 Other symptoms and signs involving the nervous system: Secondary | ICD-10-CM | POA: Diagnosis not present

## 2023-06-27 ENCOUNTER — Other Ambulatory Visit: Payer: Self-pay | Admitting: Neurosurgery

## 2023-06-27 DIAGNOSIS — R29818 Other symptoms and signs involving the nervous system: Secondary | ICD-10-CM

## 2023-06-30 NOTE — Discharge Instructions (Signed)

## 2023-07-04 ENCOUNTER — Other Ambulatory Visit

## 2023-07-04 ENCOUNTER — Inpatient Hospital Stay
Admission: RE | Admit: 2023-07-04 | Discharge: 2023-07-04 | Disposition: A | Discharge status: Home / Self Care | Source: Ambulatory Visit | Attending: Neurosurgery | Admitting: Neurosurgery

## 2023-08-10 ENCOUNTER — Other Ambulatory Visit (HOSPITAL_COMMUNITY): Payer: Self-pay | Admitting: Internal Medicine

## 2023-08-10 ENCOUNTER — Encounter: Payer: Self-pay | Admitting: Internal Medicine

## 2023-08-10 DIAGNOSIS — N320 Bladder-neck obstruction: Secondary | ICD-10-CM | POA: Diagnosis not present

## 2023-08-10 DIAGNOSIS — N401 Enlarged prostate with lower urinary tract symptoms: Secondary | ICD-10-CM | POA: Diagnosis not present

## 2023-08-10 DIAGNOSIS — N3000 Acute cystitis without hematuria: Secondary | ICD-10-CM | POA: Diagnosis not present

## 2023-08-15 ENCOUNTER — Ambulatory Visit (HOSPITAL_COMMUNITY)
Admission: RE | Admit: 2023-08-15 | Discharge: 2023-08-15 | Disposition: A | Discharge status: Home / Self Care | Source: Ambulatory Visit | Attending: Internal Medicine | Admitting: Internal Medicine

## 2023-08-15 DIAGNOSIS — N320 Bladder-neck obstruction: Secondary | ICD-10-CM | POA: Insufficient documentation

## 2023-08-15 DIAGNOSIS — N3289 Other specified disorders of bladder: Secondary | ICD-10-CM | POA: Diagnosis not present

## 2023-08-18 DIAGNOSIS — Z6833 Body mass index (BMI) 33.0-33.9, adult: Secondary | ICD-10-CM | POA: Diagnosis not present

## 2023-08-18 DIAGNOSIS — G8929 Other chronic pain: Secondary | ICD-10-CM | POA: Diagnosis not present

## 2023-08-28 ENCOUNTER — Other Ambulatory Visit (HOSPITAL_COMMUNITY): Payer: Self-pay | Admitting: Neurosurgery

## 2023-08-28 DIAGNOSIS — G8929 Other chronic pain: Secondary | ICD-10-CM

## 2023-08-31 ENCOUNTER — Ambulatory Visit (HOSPITAL_COMMUNITY)
Admission: RE | Admit: 2023-08-31 | Discharge: 2023-08-31 | Disposition: A | Discharge status: Home / Self Care | Source: Ambulatory Visit | Attending: Neurosurgery | Admitting: Neurosurgery

## 2023-08-31 DIAGNOSIS — G8929 Other chronic pain: Secondary | ICD-10-CM | POA: Insufficient documentation

## 2023-08-31 DIAGNOSIS — M51379 Other intervertebral disc degeneration, lumbosacral region without mention of lumbar back pain or lower extremity pain: Secondary | ICD-10-CM | POA: Diagnosis not present

## 2023-08-31 DIAGNOSIS — M47816 Spondylosis without myelopathy or radiculopathy, lumbar region: Secondary | ICD-10-CM | POA: Diagnosis not present

## 2023-08-31 DIAGNOSIS — M51369 Other intervertebral disc degeneration, lumbar region without mention of lumbar back pain or lower extremity pain: Secondary | ICD-10-CM | POA: Diagnosis not present

## 2023-08-31 DIAGNOSIS — M48061 Spinal stenosis, lumbar region without neurogenic claudication: Secondary | ICD-10-CM | POA: Diagnosis not present

## 2023-09-12 DIAGNOSIS — M5186 Other intervertebral disc disorders, lumbar region: Secondary | ICD-10-CM | POA: Diagnosis not present

## 2023-09-12 DIAGNOSIS — J449 Chronic obstructive pulmonary disease, unspecified: Secondary | ICD-10-CM | POA: Diagnosis not present

## 2023-09-12 DIAGNOSIS — I10 Essential (primary) hypertension: Secondary | ICD-10-CM | POA: Diagnosis not present

## 2023-09-12 DIAGNOSIS — Z79899 Other long term (current) drug therapy: Secondary | ICD-10-CM | POA: Diagnosis not present

## 2023-09-12 DIAGNOSIS — E785 Hyperlipidemia, unspecified: Secondary | ICD-10-CM | POA: Diagnosis not present

## 2023-09-12 DIAGNOSIS — Z125 Encounter for screening for malignant neoplasm of prostate: Secondary | ICD-10-CM | POA: Diagnosis not present

## 2023-09-19 DIAGNOSIS — I10 Essential (primary) hypertension: Secondary | ICD-10-CM | POA: Diagnosis not present

## 2023-09-19 DIAGNOSIS — J440 Chronic obstructive pulmonary disease with acute lower respiratory infection: Secondary | ICD-10-CM | POA: Diagnosis not present

## 2023-09-19 DIAGNOSIS — E785 Hyperlipidemia, unspecified: Secondary | ICD-10-CM | POA: Diagnosis not present

## 2023-09-19 DIAGNOSIS — M791 Myalgia, unspecified site: Secondary | ICD-10-CM | POA: Diagnosis not present

## 2023-09-19 DIAGNOSIS — M47816 Spondylosis without myelopathy or radiculopathy, lumbar region: Secondary | ICD-10-CM | POA: Diagnosis not present

## 2023-09-19 DIAGNOSIS — Z0001 Encounter for general adult medical examination with abnormal findings: Secondary | ICD-10-CM | POA: Diagnosis not present

## 2023-09-19 DIAGNOSIS — K219 Gastro-esophageal reflux disease without esophagitis: Secondary | ICD-10-CM | POA: Diagnosis not present

## 2023-09-19 DIAGNOSIS — I713 Abdominal aortic aneurysm, ruptured, unspecified: Secondary | ICD-10-CM | POA: Diagnosis not present

## 2023-09-22 DIAGNOSIS — M47816 Spondylosis without myelopathy or radiculopathy, lumbar region: Secondary | ICD-10-CM | POA: Diagnosis not present

## 2023-10-18 DIAGNOSIS — M47817 Spondylosis without myelopathy or radiculopathy, lumbosacral region: Secondary | ICD-10-CM | POA: Diagnosis not present

## 2023-12-11 DIAGNOSIS — R030 Elevated blood-pressure reading, without diagnosis of hypertension: Secondary | ICD-10-CM | POA: Diagnosis not present

## 2023-12-11 DIAGNOSIS — E669 Obesity, unspecified: Secondary | ICD-10-CM | POA: Diagnosis not present

## 2023-12-11 DIAGNOSIS — R110 Nausea: Secondary | ICD-10-CM | POA: Diagnosis not present

## 2023-12-11 DIAGNOSIS — Z6832 Body mass index (BMI) 32.0-32.9, adult: Secondary | ICD-10-CM | POA: Diagnosis not present

## 2023-12-11 DIAGNOSIS — I10 Essential (primary) hypertension: Secondary | ICD-10-CM | POA: Diagnosis not present

## 2023-12-12 DIAGNOSIS — I10 Essential (primary) hypertension: Secondary | ICD-10-CM | POA: Diagnosis not present

## 2023-12-12 DIAGNOSIS — R051 Acute cough: Secondary | ICD-10-CM | POA: Diagnosis not present

## 2023-12-29 DIAGNOSIS — M62830 Muscle spasm of back: Secondary | ICD-10-CM | POA: Diagnosis not present

## 2023-12-29 DIAGNOSIS — M9902 Segmental and somatic dysfunction of thoracic region: Secondary | ICD-10-CM | POA: Diagnosis not present

## 2023-12-29 DIAGNOSIS — M9905 Segmental and somatic dysfunction of pelvic region: Secondary | ICD-10-CM | POA: Diagnosis not present

## 2023-12-29 DIAGNOSIS — M9903 Segmental and somatic dysfunction of lumbar region: Secondary | ICD-10-CM | POA: Diagnosis not present

## 2024-01-03 DIAGNOSIS — M9902 Segmental and somatic dysfunction of thoracic region: Secondary | ICD-10-CM | POA: Diagnosis not present

## 2024-01-03 DIAGNOSIS — M62830 Muscle spasm of back: Secondary | ICD-10-CM | POA: Diagnosis not present

## 2024-01-03 DIAGNOSIS — M9903 Segmental and somatic dysfunction of lumbar region: Secondary | ICD-10-CM | POA: Diagnosis not present

## 2024-01-03 DIAGNOSIS — M9905 Segmental and somatic dysfunction of pelvic region: Secondary | ICD-10-CM | POA: Diagnosis not present

## 2024-01-08 DIAGNOSIS — J449 Chronic obstructive pulmonary disease, unspecified: Secondary | ICD-10-CM | POA: Diagnosis not present

## 2024-01-08 DIAGNOSIS — I10 Essential (primary) hypertension: Secondary | ICD-10-CM | POA: Diagnosis not present

## 2024-01-08 DIAGNOSIS — M9905 Segmental and somatic dysfunction of pelvic region: Secondary | ICD-10-CM | POA: Diagnosis not present

## 2024-01-08 DIAGNOSIS — M62830 Muscle spasm of back: Secondary | ICD-10-CM | POA: Diagnosis not present

## 2024-01-08 DIAGNOSIS — M9902 Segmental and somatic dysfunction of thoracic region: Secondary | ICD-10-CM | POA: Diagnosis not present

## 2024-01-08 DIAGNOSIS — M5416 Radiculopathy, lumbar region: Secondary | ICD-10-CM | POA: Diagnosis not present

## 2024-01-08 DIAGNOSIS — M9903 Segmental and somatic dysfunction of lumbar region: Secondary | ICD-10-CM | POA: Diagnosis not present

## 2024-01-15 DIAGNOSIS — M62830 Muscle spasm of back: Secondary | ICD-10-CM | POA: Diagnosis not present

## 2024-01-15 DIAGNOSIS — M9905 Segmental and somatic dysfunction of pelvic region: Secondary | ICD-10-CM | POA: Diagnosis not present

## 2024-01-15 DIAGNOSIS — M9902 Segmental and somatic dysfunction of thoracic region: Secondary | ICD-10-CM | POA: Diagnosis not present

## 2024-01-15 DIAGNOSIS — M9903 Segmental and somatic dysfunction of lumbar region: Secondary | ICD-10-CM | POA: Diagnosis not present

## 2024-01-26 DIAGNOSIS — M9902 Segmental and somatic dysfunction of thoracic region: Secondary | ICD-10-CM | POA: Diagnosis not present

## 2024-01-26 DIAGNOSIS — M9905 Segmental and somatic dysfunction of pelvic region: Secondary | ICD-10-CM | POA: Diagnosis not present

## 2024-01-26 DIAGNOSIS — M62830 Muscle spasm of back: Secondary | ICD-10-CM | POA: Diagnosis not present

## 2024-01-26 DIAGNOSIS — M9903 Segmental and somatic dysfunction of lumbar region: Secondary | ICD-10-CM | POA: Diagnosis not present

## 2024-01-27 ENCOUNTER — Emergency Department (HOSPITAL_COMMUNITY)
Admission: EM | Admit: 2024-01-27 | Discharge: 2024-01-27 | Disposition: A | Discharge status: Home / Self Care | Attending: Emergency Medicine | Admitting: Emergency Medicine

## 2024-01-27 ENCOUNTER — Encounter (HOSPITAL_COMMUNITY): Payer: Self-pay | Admitting: Emergency Medicine

## 2024-01-27 ENCOUNTER — Other Ambulatory Visit: Payer: Self-pay

## 2024-01-27 DIAGNOSIS — R040 Epistaxis: Secondary | ICD-10-CM | POA: Insufficient documentation

## 2024-01-27 DIAGNOSIS — Z79899 Other long term (current) drug therapy: Secondary | ICD-10-CM | POA: Diagnosis not present

## 2024-01-27 DIAGNOSIS — I10 Essential (primary) hypertension: Secondary | ICD-10-CM | POA: Insufficient documentation

## 2024-01-27 LAB — BASIC METABOLIC PANEL WITH GFR
Anion gap: 5 (ref 5–15)
BUN: 10 mg/dL (ref 8–23)
CO2: 31 mmol/L (ref 22–32)
Calcium: 8.7 mg/dL — ABNORMAL LOW (ref 8.9–10.3)
Chloride: 105 mmol/L (ref 98–111)
Creatinine, Ser: 0.99 mg/dL (ref 0.61–1.24)
GFR, Estimated: >60 mL/min (ref >60)
Glucose, Bld: 94 mg/dL (ref 70–99)
Potassium: 4.5 mmol/L (ref 3.5–5.1)
Sodium: 140 mmol/L (ref 135–145)

## 2024-01-27 LAB — CBC WITH DIFFERENTIAL/PLATELET
Abs Immature Granulocytes: 0.02 K/uL (ref 0.00–0.07)
Basophils Absolute: 0.1 K/uL (ref 0.0–0.1)
Basophils Relative: 1 %
Eosinophils Absolute: 0.5 K/uL (ref 0.0–0.5)
Eosinophils Relative: 5 %
HCT: 38.9 % — ABNORMAL LOW (ref 39.0–52.0)
Hemoglobin: 12.4 g/dL — ABNORMAL LOW (ref 13.0–17.0)
Immature Granulocytes: 0 %
Lymphocytes Relative: 23 %
Lymphs Abs: 2.3 K/uL (ref 0.7–4.0)
MCH: 30 pg (ref 26.0–34.0)
MCHC: 31.9 g/dL (ref 30.0–36.0)
MCV: 94.2 fL (ref 80.0–100.0)
Monocytes Absolute: 1.4 K/uL — ABNORMAL HIGH (ref 0.1–1.0)
Monocytes Relative: 14 %
Neutro Abs: 5.6 K/uL (ref 1.7–7.7)
Neutrophils Relative %: 57 %
Platelets: 319 K/uL (ref 150–400)
RBC: 4.13 MIL/uL — ABNORMAL LOW (ref 4.22–5.81)
RDW: 16.8 % — ABNORMAL HIGH (ref 11.5–15.5)
WBC: 9.8 K/uL (ref 4.0–10.5)
nRBC: 0 % (ref 0.0–0.2)

## 2024-01-27 LAB — PROTIME-INR
INR: 0.9 (ref 0.8–1.2)
Prothrombin Time: 13.1 s (ref 11.4–15.2)

## 2024-01-27 MED ORDER — OXYMETAZOLINE HCL 0.05 % NA SOLN
1.0000 | Freq: Once | NASAL | Status: AC
  Administered 2024-01-27: 1 via NASAL
  Filled 2024-01-27: qty 30

## 2024-01-27 NOTE — ED Triage Notes (Signed)
 Pt bib EMS for nose bleed. Per EMS, pt had nose bleed last night that eventually stopped and then started again this morning. Upon arrival to triage, nose had stopped bleeding.

## 2024-01-27 NOTE — Discharge Instructions (Signed)
 If you have a recurrent nosebleed please blow all of the blood out of your nose, give yourself a squirt of Afrin and hold pressure on your nose for at least 20 minutes, if you have ongoing bleeding return to the ER, otherwise follow-up with the ear nose and throat doctor.

## 2024-01-27 NOTE — ED Provider Notes (Signed)
 [place] EMERGENCY DEPARTMENT AT [facility] Provider Note   CSN: [id number] Arrival date & time: 01/27/24  [id number]     Patient presents with: Epistaxis   Jeremy Mclean is a 73 y.o. male.    Epistaxis    This patient is a 73 year old male with a history of a nosebleed this morning.  He has a known history of hypertension on lisinopril, he does not have a history of nosebleeds however he did develop a nosebleed last night and then again this morning, he has no history of trauma picking or excessive sneezing, he does take BC powders regularly because of what he describes as chronic pain, the nosebleed stopped spontaneously last night and when he got up again this morning it seemed to start again spontaneously, it is now stopped again.  The paramedics transported the patient, no bleeding upon arrival, the patient has not had his blood pressure medication last night nor this morning.  He denies chest pain shortness of breath or any other symptoms.    Prior to Admission medications   Medication Sig Start Date End Date Taking? Authorizing Provider  albuterol  (PROVENTIL  HFA;VENTOLIN  HFA) 108 (90 Base) MCG/ACT inhaler Inhale 1-2 puffs into the lungs every 6 (six) hours as needed for wheezing or shortness of breath.    [provider]  amLODipine (NORVASC) 10 MG tablet Take 10 mg by mouth every evening.  10/21/17   [provider]  BREO ELLIPTA  200-25 MCG/INH AEPB Inhale 1 puff into the lungs every evening.  11/29/16   [provider]  diazepam  (VALIUM ) 5 MG tablet Take 5 mg by mouth at bedtime.    [provider]  diclofenac  Sodium (VOLTAREN ) 1 % GEL Apply 2 g topically 4 (four) times daily. 07/28/21   Chandra Harlene LABOR, NP  diphenhydrAMINE  (BENADRYL ) 25 MG tablet Take 25 mg by mouth at bedtime. With Diazepam     [provider]  diphenhydrAMINE  (BENADRYL ) 50 MG capsule Take 50 mg by mouth 1 hour prior to your procedure. 11/13/18   Oris Krystal FALCON, MD  ezetimibe (ZETIA) 10 MG tablet  03/04/19   [provider]  HYDROcodone -acetaminophen  (NORCO) 10-325 MG tablet Take 1 tablet by mouth every 6 (six) hours as needed. 10/08/21   Margrette Taft BRAVO, MD  lisinopril (PRINIVIL,ZESTRIL) 40 MG tablet Take 40 mg by mouth every evening.  10/21/17   [provider]  pantoprazole  (PROTONIX ) 40 MG tablet Take 1 tablet (40 mg total) by mouth 2 (two) times daily before a meal. 01/15/17   Tat, Alm, MD  polyethylene glycol-electrolytes (TRILYTE) 420 g solution Take 4,000 mLs by mouth as directed. 07/14/20   Golda Claudis PENNER, MD  traMADol  (ULTRAM ) 50 MG tablet Take 50 mg by mouth 3 (three) times daily.    [provider]    Allergies: Contrast media [iodinated contrast media], Iohexol , and Prednisone     Review of Systems  HENT:  Positive for nosebleeds.   All other systems reviewed and are negative.   Updated Vital Signs BP (!) 172/89 (BP Location: Left Arm)   Pulse 69   Temp (!) 97.5 F (36.4 C) (Axillary)   Resp 18   Ht 1.778 m (5' 10)   Wt 93.4 kg   SpO2 95%   BMI 29.56 kg/m   Physical Exam Vitals and nursing note reviewed.  Constitutional:      General: He is not in acute distress.    Appearance: He is well-developed.  HENT:  Head: Normocephalic and atraumatic.     Nose:     Comments: Bilateral nose evaluated with speculum, no evidence of bleeding scabbing scarring or clot.  Oropharynx is clear as well    Mouth/Throat:     Pharynx: No oropharyngeal exudate.  Eyes:     General: No scleral icterus.       Right eye: No discharge.        Left eye: No discharge.     Conjunctiva/sclera: Conjunctivae normal.     Pupils: Pupils are equal, round, and reactive to light.  Neck:     Thyroid: No thyromegaly.     Vascular: No JVD.  Cardiovascular:     Rate and Rhythm: Normal rate and regular rhythm.     Heart sounds: Normal heart sounds. No murmur heard.    No friction rub. No gallop.  Pulmonary:      Effort: Pulmonary effort is normal. No respiratory distress.     Breath sounds: Normal breath sounds. No wheezing or rales.  Abdominal:     General: Bowel sounds are normal. There is no distension.     Palpations: Abdomen is soft. There is no mass.     Tenderness: There is no abdominal tenderness.  Musculoskeletal:        General: No tenderness. Normal range of motion.     Cervical back: Normal range of motion and neck supple.     Right lower leg: No edema.     Left lower leg: No edema.  Lymphadenopathy:     Cervical: No cervical adenopathy.  Skin:    General: Skin is warm and dry.     Findings: No erythema or rash.  Neurological:     Mental Status: He is alert.     Coordination: Coordination normal.  Psychiatric:        Behavior: Behavior normal.     (all labs ordered are listed, but only abnormal results are displayed) Labs Reviewed  CBC WITH DIFFERENTIAL/PLATELET - Abnormal; Notable for the following components:      Result Value   RBC 4.13 (*)    Hemoglobin 12.4 (*)    HCT 38.9 (*)    RDW 16.8 (*)    Monocytes Absolute 1.4 (*)    All other components within normal limits  BASIC METABOLIC PANEL WITH GFR - Abnormal; Notable for the following components:   Calcium  8.7 (*)    All other components within normal limits  PROTIME-INR    EKG: None  Radiology: No results found.   Procedures   Medications Ordered in the ED  oxymetazoline  (AFRIN) 0.05 % nasal spray 1 spray (has no administration in time range)    Clinical Course as of 01/27/24 0939  Sat Jan 27, 2024  0754 Reevaluated - no bleeding at this time Taking home meds  [BM]    Clinical Course User Index [BM] Cleotilde Rogue, MD                                 Medical Decision Making Amount and/or Complexity of Data Reviewed Labs: ordered.  Risk OTC drugs.    This patient presents to the ED for concern of nosebleed differential diagnosis includes superficial vessel, there does not appear to be  any evidence of bleeding at this time.    Additional history obtained   Additional history obtained from Electronic Medical Record External records from outside source obtained and reviewed including medical record,  the patient has office visits with family doctor, no recent admissions to the hospital, appears to have chronic low back pain with spinal stenosis of his lumbar region   Lab Tests:  I Ordered, and personally interpreted labs.  The pertinent results include: CBC and basic metabolic panel unremarkable, INR is essentially normal    Medicines ordered and prescription drug management:  I ordered medication including Afrin, it was not necessary as the patient had no recurrent bleeding    I have reviewed the patients home medicines and have made adjustments as needed   Problem List / ED Course:  No bleeding in over 2-1/2 hours, patient is requesting to be discharged home, I think this is reasonable and he will return should symptoms worsen   Given referral to ENT   I have discussed with the patient at the bedside the results, and the meaning of these results.  They have had opportunity to ask questions,  expressed their understanding to the need for follow-up with primary care physician      Final diagnoses:  Right-sided epistaxis    ED Discharge Orders     None          Cleotilde Rogue, MD 01/27/24 [phone]

## 2024-01-29 ENCOUNTER — Other Ambulatory Visit: Payer: Self-pay

## 2024-01-29 ENCOUNTER — Encounter (HOSPITAL_COMMUNITY): Payer: Self-pay | Admitting: *Deleted

## 2024-01-29 ENCOUNTER — Emergency Department (HOSPITAL_COMMUNITY)
Admission: EM | Admit: 2024-01-29 | Discharge: 2024-01-29 | Disposition: A | Discharge status: Home / Self Care | Attending: Emergency Medicine | Admitting: Emergency Medicine

## 2024-01-29 DIAGNOSIS — R040 Epistaxis: Secondary | ICD-10-CM

## 2024-01-29 LAB — BASIC METABOLIC PANEL WITH GFR
Anion gap: 10 (ref 5–15)
BUN: 14 mg/dL (ref 8–23)
CO2: 26 mmol/L (ref 22–32)
Calcium: 9 mg/dL (ref 8.9–10.3)
Chloride: 104 mmol/L (ref 98–111)
Creatinine, Ser: 1.17 mg/dL (ref 0.61–1.24)
GFR, Estimated: >60 mL/min (ref >60)
Glucose, Bld: 104 mg/dL — ABNORMAL HIGH (ref 70–99)
Potassium: 4.7 mmol/L (ref 3.5–5.1)
Sodium: 140 mmol/L (ref 135–145)

## 2024-01-29 LAB — CBC WITH DIFFERENTIAL/PLATELET
Abs Immature Granulocytes: 0.05 K/uL (ref 0.00–0.07)
Basophils Absolute: 0.1 K/uL (ref 0.0–0.1)
Basophils Relative: 1 %
Eosinophils Absolute: 0.5 K/uL (ref 0.0–0.5)
Eosinophils Relative: 4 %
HCT: 40.1 % (ref 39.0–52.0)
Hemoglobin: 13 g/dL (ref 13.0–17.0)
Immature Granulocytes: 0 %
Lymphocytes Relative: 33 %
Lymphs Abs: 4 K/uL (ref 0.7–4.0)
MCH: 30.4 pg (ref 26.0–34.0)
MCHC: 32.4 g/dL (ref 30.0–36.0)
MCV: 93.7 fL (ref 80.0–100.0)
Monocytes Absolute: 1.7 K/uL — ABNORMAL HIGH (ref 0.1–1.0)
Monocytes Relative: 14 %
Neutro Abs: 6 K/uL (ref 1.7–7.7)
Neutrophils Relative %: 48 %
Platelets: 370 K/uL (ref 150–400)
RBC: 4.28 MIL/uL (ref 4.22–5.81)
RDW: 16.9 % — ABNORMAL HIGH (ref 11.5–15.5)
WBC: 12.2 K/uL — ABNORMAL HIGH (ref 4.0–10.5)
nRBC: 0 % (ref 0.0–0.2)

## 2024-01-29 MED ORDER — OXYMETAZOLINE HCL 0.05 % NA SOLN
1.0000 | Freq: Once | NASAL | Status: AC
  Administered 2024-01-29: 1 via NASAL
  Filled 2024-01-29: qty 30

## 2024-01-29 MED ORDER — LIDOCAINE HCL 4 % EX SOLN: Freq: Once | CUTANEOUS | Status: DC

## 2024-01-29 MED ORDER — LIDOCAINE HCL 4 % EX SOLN
Freq: Once | CUTANEOUS | Status: AC
  Filled 2024-01-29: qty 50

## 2024-01-29 MED ORDER — LIDOCAINE 5 % EX PTCH
1.0000 | MEDICATED_PATCH | Freq: Once | CUTANEOUS | Status: DC
  Filled 2024-01-29: qty 1

## 2024-01-29 NOTE — ED Triage Notes (Signed)
 Pt states he has nosebleeds off and on for about a week. Pt presents to ED with blood covering shirt and towel stopping the bleed at this time. Pt denies blood thinners.

## 2024-01-29 NOTE — ED Triage Notes (Signed)
 States this started at 5 am, c/o blood running down the back of his throat. , states its the right side only

## 2024-01-29 NOTE — ED Provider Notes (Signed)
 [place] EMERGENCY DEPARTMENT AT [facility] Provider Note   CSN: [id number] Arrival date & time: 01/29/24  9272     Patient presents with: Epistaxis   Jeremy Mclean is a 73 y.o. male.   Patient complains of a nosebleed.  He has had multiple nosebleeds in the past week.  Patient was seen at [facility] 2 days ago.  By the time he got to the emergency department the bleeding had stopped.  Patient was advised to use Afrin and to blow all the clots out of his nose.  Patient states that he had an episode today and the Afrin and blowing did not help.  Patient states that they held pressure and he continued to bleed.  Patient is concerned that his bleeding is being caused by gabapentin.  Patient was started on gabapentin a week ago for chronic pain.  Patient states that he read on [employer] that gabapentin can cause nosebleeds.  Patient denies any fever or chills he has not had any cough or congestion.  Patient is not on any blood thinners he states he does not take aspirin but he does take BC powders.  Patient reports he has been running in the heat at home.  He has a humidifier in 1 room.  Patient reports he has had bleeding from his right side only.  Patient has a past medical history of hypertension anemia, aortic abdominal aneurysm.  The history is provided by the patient and the spouse.  Epistaxis Location:  R nare Severity:  Moderate Timing:  Constant Progression:  Worsening      Prior to Admission medications   Medication Sig Start Date End Date Taking? Authorizing Provider  albuterol  (PROVENTIL  HFA;VENTOLIN  HFA) 108 (90 Base) MCG/ACT inhaler Inhale 1-2 puffs into the lungs every 6 (six) hours as needed for wheezing or shortness of breath.    [provider]  amLODipine (NORVASC) 10 MG tablet Take 10 mg by mouth every evening.  10/21/17   [provider]  BREO ELLIPTA  200-25 MCG/INH AEPB Inhale 1 puff into the lungs every evening.  11/29/16   [provider]  diazepam  (VALIUM ) 5 MG tablet Take 5 mg by mouth at bedtime.    [provider]  diclofenac  Sodium (VOLTAREN ) 1 % GEL Apply 2 g topically 4 (four) times daily. 07/28/21   Chandra Harlene LABOR, NP  diphenhydrAMINE  (BENADRYL ) 25 MG tablet Take 25 mg by mouth at bedtime. With Diazepam     [provider]  diphenhydrAMINE  (BENADRYL ) 50 MG capsule Take 50 mg by mouth 1 hour prior to your procedure. 11/13/18   Oris Krystal FALCON, MD  ezetimibe (ZETIA) 10 MG tablet  03/04/19   [provider]  HYDROcodone -acetaminophen  (NORCO) 10-325 MG tablet Take 1 tablet by mouth every 6 (six) hours as needed. 10/08/21   Margrette Taft BRAVO, MD  lisinopril (PRINIVIL,ZESTRIL) 40 MG tablet Take 40 mg by mouth every evening.  10/21/17   [provider]  pantoprazole  (PROTONIX ) 40 MG tablet Take 1 tablet (40 mg total) by mouth 2 (two) times daily before a meal. 01/15/17   Tat, Alm, MD  polyethylene glycol-electrolytes (TRILYTE) 420 g solution Take 4,000 mLs by mouth as directed. 07/14/20   [name], Claudis PENNER, MD  traMADol  (ULTRAM ) 50 MG tablet Take 50 mg by mouth 3 (three) times daily.    [provider]    Allergies: Contrast media [iodinated contrast media], Iohexol , and Prednisone     Review of Systems  HENT:  Positive  for nosebleeds.   All other systems reviewed and are negative.   Updated Vital Signs BP (!) 153/89   Pulse 71   Temp 97.8 F (36.6 C)   Resp 17   Ht 5' 10 (1.778 m)   Wt 93.4 kg   SpO2 97%   BMI 29.56 kg/m   Physical Exam Vitals reviewed.  Constitutional:      Appearance: Normal appearance.  HENT:     Head: Normocephalic.     Nose: Nose normal.     Comments: Dried blood right nare  Cardiovascular:     Rate and Rhythm: Normal rate.  Pulmonary:     Effort: Pulmonary effort is normal.  Abdominal:     General: Abdomen is flat.  Skin:    General: Skin is warm.  Neurological:     General: No focal deficit present.     Mental Status:  He is alert.     (all labs ordered are listed, but only abnormal results are displayed) Labs Reviewed  CBC WITH DIFFERENTIAL/PLATELET - Abnormal; Notable for the following components:      Result Value   WBC 12.2 (*)    RDW 16.9 (*)    Monocytes Absolute 1.7 (*)    All other components within normal limits  BASIC METABOLIC PANEL WITH GFR - Abnormal; Notable for the following components:   Glucose, Bld 104 (*)    All other components within normal limits    EKG: None  Radiology: No results found.   Wound packing  Date/Time: 01/29/2024 10:42 AM  Performed by: Flint Sonny POUR, PA-C Authorized by: Flint Sonny POUR, PA-C  Consent: Verbal consent obtained Risks and benefits: risks, benefits and alternatives were discussed Consent given by: patient Patient understanding: patient states understanding of the procedure being performed Patient identity confirmed: verbally with patient Local anesthesia used: yes Anesthesia: local infiltration  Anesthesia: Local anesthesia used: yes  Sedation: Patient sedated: no       Medications Ordered in the ED  oxymetazoline  (AFRIN) 0.05 % nasal spray 1 spray (1 spray Each Nare Given 01/29/24 0833)  lidocaine  (XYLOCAINE ) 4 % external solution ( Topical Given 01/29/24 0833)                                    Medical Decision Making Pt has had multiple nosebleeds over the past week.  Pt reports he had a large amount of bleeding today   Amount and/or Complexity of Data Reviewed Labs: ordered. Decision-making details documented in ED Course.    Details: Labs ordered reviewed and interpreted.  Pt has a normal hemoglobin and normal platelets   Risk OTC drugs.   Pt packed with rhino rocket,  Pt tolerated weel       Final diagnoses:  Right-sided epistaxis    ED Discharge Orders     None       An After Visit Summary was printed and given to the patient.    Flint Sonny POUR, [place] 01/29/24 1043    Levander Houston,  MD 01/29/24 [phone]

## 2024-01-29 NOTE — Discharge Instructions (Signed)
 Go to Urgent care tomorrow to have packing removed.  If bleeding reoccurs, hold pressure for 15 minutes without stopping pressure.  Follow up with your Physician for recheck

## 2024-02-28 ENCOUNTER — Encounter: Payer: Self-pay | Admitting: Neurosurgery

## 2024-03-14 ENCOUNTER — Other Ambulatory Visit: Payer: Self-pay | Admitting: Neurosurgery

## 2024-03-14 DIAGNOSIS — M47816 Spondylosis without myelopathy or radiculopathy, lumbar region: Secondary | ICD-10-CM

## 2024-03-20 ENCOUNTER — Telehealth: Payer: Self-pay

## 2024-03-20 NOTE — Telephone Encounter (Signed)
 Alternate pre med protocol Methylprednisolone  32 mg 12 hr prior and 32 mg 2 hrs prior with 50 mg Benedryl. Times: 0100, 1100.

## 2024-03-20 NOTE — Discharge Instructions (Signed)

## 2024-03-21 ENCOUNTER — Ambulatory Visit
Admission: RE | Admit: 2024-03-21 | Discharge: 2024-03-21 | Disposition: A | Discharge status: Home / Self Care | Source: Ambulatory Visit | Attending: Neurosurgery | Admitting: Neurosurgery

## 2024-03-21 DIAGNOSIS — M47816 Spondylosis without myelopathy or radiculopathy, lumbar region: Secondary | ICD-10-CM

## 2024-03-21 MED ORDER — IOPAMIDOL (ISOVUE-M 200) INJECTION 41%
20.0000 mL | Freq: Once | INTRAMUSCULAR | Status: AC
  Administered 2024-03-21: 20 mL via INTRATHECAL

## 2024-03-21 MED ORDER — MEPERIDINE HCL 50 MG/ML IJ SOLN: 50.0000 mg | Freq: Once | INTRAMUSCULAR | Status: DC | PRN

## 2024-03-21 MED ORDER — ONDANSETRON HCL 4 MG/2ML IJ SOLN: 4.0000 mg | Freq: Once | INTRAMUSCULAR | Status: DC | PRN

## 2024-03-21 MED ORDER — DIAZEPAM 5 MG PO TABS
5.0000 mg | ORAL_TABLET | Freq: Once | ORAL | Status: AC
  Administered 2024-03-21: 5 mg via ORAL
